# Patient Record
Sex: Male | Born: 1955
Health system: Southern US, Community
[De-identification: ages and names within clinical notes are randomized; demographics above are authoritative.]

## PROBLEM LIST (undated history)

## (undated) DIAGNOSIS — Z720 Tobacco use: Secondary | ICD-10-CM

## (undated) DIAGNOSIS — R51 Headache: Secondary | ICD-10-CM

## (undated) DIAGNOSIS — N2889 Other specified disorders of kidney and ureter: Secondary | ICD-10-CM

## (undated) DIAGNOSIS — I219 Acute myocardial infarction, unspecified: Secondary | ICD-10-CM

## (undated) DIAGNOSIS — E785 Hyperlipidemia, unspecified: Secondary | ICD-10-CM

## (undated) DIAGNOSIS — I255 Ischemic cardiomyopathy: Secondary | ICD-10-CM

## (undated) DIAGNOSIS — R519 Headache, unspecified: Secondary | ICD-10-CM

## (undated) DIAGNOSIS — I2510 Atherosclerotic heart disease of native coronary artery without angina pectoris: Secondary | ICD-10-CM

## (undated) DIAGNOSIS — R935 Abnormal findings on diagnostic imaging of other abdominal regions, including retroperitoneum: Secondary | ICD-10-CM

## (undated) HISTORY — PX: OTHER SURGICAL HISTORY: SHX169

## (undated) HISTORY — DX: Ischemic cardiomyopathy: I25.5

## (undated) HISTORY — DX: Tobacco use: Z72.0

## (undated) HISTORY — PX: BACK SURGERY: SHX140

---

## 2003-09-06 DIAGNOSIS — D229 Melanocytic nevi, unspecified: Secondary | ICD-10-CM

## 2003-09-06 HISTORY — DX: Melanocytic nevi, unspecified: D22.9

## 2004-04-20 ENCOUNTER — Encounter: Admission: RE | Admit: 2004-04-20 | Discharge: 2004-04-20 | Payer: Self-pay | Admitting: Orthopedic Surgery

## 2004-10-12 ENCOUNTER — Ambulatory Visit (HOSPITAL_BASED_OUTPATIENT_CLINIC_OR_DEPARTMENT_OTHER): Admission: RE | Admit: 2004-10-12 | Discharge: 2004-10-12 | Payer: Self-pay | Admitting: Urology

## 2004-10-12 ENCOUNTER — Encounter (INDEPENDENT_AMBULATORY_CARE_PROVIDER_SITE_OTHER): Payer: Self-pay | Admitting: Specialist

## 2007-09-23 ENCOUNTER — Ambulatory Visit (HOSPITAL_COMMUNITY): Admission: RE | Admit: 2007-09-23 | Discharge: 2007-09-24 | Payer: Self-pay | Admitting: Neurosurgery

## 2010-12-18 NOTE — Op Note (Signed)
Brett Clark, Brett Clark NO.:  [id number]   MEDICAL RECORD NO.:  [id number]          PATIENT TYPE:  OIB   LOCATION:  3599                         FACILITY:  [facility]   PHYSICIAN:  Donalee Citrin, M.D.        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   PROCEDURE:  Diskectomy and fusion, C3-4, using a 6-mm allograft wedge  and a 25-mm Venture plate with four 13-mm variable-angled screws.   SURGEON:  Donalee Citrin, MD.   ASSISTANT:  Tia Alert, MD.   ANESTHESIA:  General.   HISTORY OF PRESENT ILLNESS:  The patient is a very pleasant 51-year  gentleman who had progressive numbness and weakness of both hands.  Exam  showed some weakness of his hand intrinsics and weakness of the right  triceps.  MRI showed a large spondylitic spur compressing the spinal  cord with signal change within the spinal cord, hematomyelia or gliosis,  and spinal cord compression at this level.  The patient was recommended  anterior cervical diskectomy and fusion.  The risks and benefits of the  operation were explained to the patient, who understood and agreed to  proceed forward.   The patient was brought into the OR and was induced under general  anesthesia, placed supine with the neck slightly extended in 5 pounds of  halter traction.  The right side of the neck was prepped and draped in  this fashion.  A preoperative x-ray localized the appropriate level.  A  curvilinear incision was made just off the midline to anterior border of  the sternocleidomastoid.  The superficial layer of the platysma was  dissected out and divided longitudinally.  The avascular plane between  the sternomastoid and the strap muscles was developed down to the  prevertebral fascia.  The prevertebral  fascia was dissected with  Kitners.  The intraoperative confirmed localization of the appropriate.  Annulotomy was then made with a 15 blade scalpel to mark the disk space.  The longus colli was reflected laterally.  A self-retaining retractor  was placed.  Then a large anterior osteophyte was bitten off the C3  vertebral body with a 3-mm Kerrison punch, exposing disk space.  The  disk was scraped with a BA curette and pituitary rongeurs and drilled  down with a high-speed drill to the posterior annulus and osteophyte  complexes.  At this point, the operating microscope draped and brought  into the field and under microscopic illumination, the posterior annulus  and posterior longitudinal ligament was identified and removed in  piecemeal fashion.  A large posterior osteophyte coming off the inferior  aspect of the C3 vertebral body and the posterior endplate was  aggressively underbitten, decompressing the central canal.  There was  noted to be marked ligamentous hypertrophy as well as spondylitic  compression of spinal cord and very carefully using a 1-mm Kerrison, the  undersurface of the back  of the C3 vertebral body was aggressively  underbitten as well as the posterior aspect of the C4 vertebral body.  This decompressed the central canal.  Both C4 pedicles were identified  and both C4 nerve neural foramina were decompressed.  At the end of the  diskectomy there was no further stenosis on the thecal sac or either C4  nerve root.  The wound was then copiously irrigated and meticulous  hemostasis was maintained.  The endplates were scraped and prepared to  receive the bone graft.  A 6-mm lordotic Cornerstone allograft wedge was  then inserted 1 mm deep to the anterior vertebral body line.  A 25-  French plate was then placed.  All screws had excellent purchase.  The  locking mechanism was engaged.  The wound was then copiously irrigated  and meticulous hemostasis was maintained.  The  platysma was  reapproximated with interrupted Vicryl and the skin was closed with a  running for subcuticular.  Dermabond, Benzoin and Steri-Strips were then  applied and the patient went to the recovery room in stable condition.  At the end of the case the instrument, needle and sponge counts were  correct.           ______________________________  Donalee Citrin, M.D.     GC/MEDQ  D:  09/23/2007  T:  09/24/2007  Job:  [id number]

## 2010-12-21 NOTE — Op Note (Signed)
Brett Clark, Brett Clark               ACCOUNT NO.:  [id number]   MEDICAL RECORD NO.:  [id number]          PATIENT TYPE:  AMB   LOCATION:  NESC                         FACILITY:  [facility]   PHYSICIAN:  Courtney Paris, M.D.DATE OF BIRTH:  Jan 14, 1956   DATE OF PROCEDURE:  10/12/2004  DATE OF DISCHARGE:                                 OPERATIVE REPORT   PREOPERATIVE DIAGNOSIS:  Bilateral hydroceles, left greater than right.   POSTOPERATIVE DIAGNOSIS:  Bilateral hydroceles, left greater than right.   PROCEDURE PERFORMED:  Bilateral hydrocelectomy.   ATTENDING SURGEON:  Courtney Paris, M.D.   RESIDENT SURGEON:  Thyra Breed, M.D.   ANESTHESIA:  Laryngeal mask airway.   ESTIMATED BLOOD LOSS:  30 cc.   DRAINS:  None.   COMPLICATIONS:  None.   INDICATIONS FOR PROCEDURE:  Brett Clark is a pleasant 55 year old male  evaluated for an enlarged scrotum.  These appeared to be simple hydroceles,  left greater than right.  A subsequent CT scan was ordered, given the  tracking of the bulge into the left inguinal region.  This was consistent  with a simple fluid collection such as a hydrocele.  The patient has been  having some increasing pain in his left testicle and inguinal region.  He  has elected to proceed with a bilateral hydrocelectomy at this time.  The  patient understands all risks, benefits and alternatives of the procedure in  detail and is willing to proceed.   PROCEDURE IN DETAIL:  Following identification by his arm bracelet, the  patient was brought to the operating room and placed in the supine position.  Here, he underwent successful induction of general endotracheal anesthesia  and received preoperative antibiotics.  His genitalia were then shaved,  prepped with Betadine, and draped in the usual sterile fashion.  We created  an approximate 6 cm incision using the scalpel in the midline raphe of the  scrotum.  We then isolated the left hemiscrotum and began to dissect  through  the dartos layers and overlying tunics until the tunica vaginalis was  visualized, containing the hydrocele.  This layer was left intact, and the  overlying layers were dissected out carefully using the hemostat and Bovie  electrocautery.  The left hydrocele was then delivered from the scrotal  incision.  A dry Ray-Tec sponge was used to isolate the entire hydrocele,  even the portion that tracked into the left inguinal region.  The hydrocele  sac was then opened, and approximately 500 cc of clear, straw-colored fluid  returned.  Any excess sac of the hydrocele was then trimmed after the sac  was fully opened.  The edges of the tunica were carefully fulgurated in  their entirety to prevent any postoperative bleeding.  Any remaining points  of bleeding in the dartos was then fulgurated in its entirety using the  Bovie.  A 3-0 chromic suture was then used in a running fashion to close the  inverted tunic on itself in a bottle-neck fashion.  Care was taken to allow  over-tensioning the closure of the tunica over the cord vessels.  We  then  turned our attention to the right-sided hemiscrotum, where an identical  procedure was then performed to perform the right hydrocelectomy.  Again, it  was carefully dissected out, and approximately 60 cc of straw-colored fluid  were gathered.  There was no excess tunica trimmed.  The tunica was opened,  edges fulgurated, and it was closed in an inverted fashion on itself using a  running 3-0 chromic suture.  Any bleeding within the right dartos tissue was  controlled using the Bovie.  At this time, we noted some continued bleeding  from beneath our closure of the left tunic.  Therefore, the proximal portion  of the suture was cut.  The edge of the tunica was seen to be bleeding.  This was bovied and fulgurated in its entirety.  We then used another 3-0  chromic stitch in a running fashion to complete the closure and connect with  the initial suture  line.  The end result on the right and the left were  hemostatic.  Both the testis, appendix, and the testis and epididymis were  removed on either side.  There was no reason to leave a drain.  The  testicles were then returned in their anatomic location without any twisting  or kinking of the cord into their respective hemiscrotums.  A 3-0 chromic  suture was then used in a running fashion to close the dartos, including the  dartos from the right hemiscrotum, the left hemiscrotum, and capturing the  septal dartos in each suture.  Following this, several interrupted 4-0  chromic vertical mattress sutures were used to reapproximate the skin.  The  incision was cleaned and dried.  Collodion was applied to complete the  dressing.  The patient tolerated the procedure well, and there were no  complications.  All sponge, needle, and instrument counts were correct x2.  Dr. Vic Blackbird was present and participated in the entire procedure,  as he was the responsible surgeon.   DISPOSITION:  After awakening from general anesthesia, the patient was  transported to the post anesthesia care unit in stable condition.  From  here, he was discharged to home.  He was given a prescription for Dilaudid 2  mg (#20).  He is to return in five days to see Dr. Aldean Ast.      EG/MEDQ  D:  10/12/2004  T:  10/12/2004  Job:  [id number]

## 2011-04-26 LAB — CBC
Hemoglobin: 15.1
MCHC: 35.2
RBC: 4.8
WBC: 8.9

## 2011-11-04 ENCOUNTER — Emergency Department: Payer: Self-pay | Admitting: Emergency Medicine

## 2013-09-28 ENCOUNTER — Other Ambulatory Visit: Payer: Self-pay | Admitting: Dermatology

## 2013-10-14 ENCOUNTER — Other Ambulatory Visit: Payer: Self-pay | Admitting: Dermatology

## 2014-06-05 DIAGNOSIS — I219 Acute myocardial infarction, unspecified: Secondary | ICD-10-CM

## 2014-06-05 HISTORY — DX: Acute myocardial infarction, unspecified: I21.9

## 2014-06-22 ENCOUNTER — Encounter (HOSPITAL_COMMUNITY): Payer: Self-pay

## 2014-06-22 ENCOUNTER — Emergency Department (HOSPITAL_COMMUNITY): Payer: 59

## 2014-06-22 ENCOUNTER — Inpatient Hospital Stay (HOSPITAL_COMMUNITY)
Admission: EM | Admit: 2014-06-22 | Discharge: 2014-06-24 | DRG: 247 | Disposition: A | Discharge status: Home / Self Care | Payer: 59 | Attending: Interventional Cardiology | Admitting: Interventional Cardiology

## 2014-06-22 DIAGNOSIS — Z79899 Other long term (current) drug therapy: Secondary | ICD-10-CM

## 2014-06-22 DIAGNOSIS — I2510 Atherosclerotic heart disease of native coronary artery without angina pectoris: Secondary | ICD-10-CM | POA: Diagnosis present

## 2014-06-22 DIAGNOSIS — Z7902 Long term (current) use of antithrombotics/antiplatelets: Secondary | ICD-10-CM | POA: Diagnosis not present

## 2014-06-22 DIAGNOSIS — R778 Other specified abnormalities of plasma proteins: Secondary | ICD-10-CM

## 2014-06-22 DIAGNOSIS — Z7982 Long term (current) use of aspirin: Secondary | ICD-10-CM | POA: Diagnosis not present

## 2014-06-22 DIAGNOSIS — E785 Hyperlipidemia, unspecified: Secondary | ICD-10-CM | POA: Diagnosis present

## 2014-06-22 DIAGNOSIS — R1013 Epigastric pain: Secondary | ICD-10-CM

## 2014-06-22 DIAGNOSIS — R10816 Epigastric abdominal tenderness: Secondary | ICD-10-CM | POA: Diagnosis present

## 2014-06-22 DIAGNOSIS — R7989 Other specified abnormal findings of blood chemistry: Secondary | ICD-10-CM

## 2014-06-22 DIAGNOSIS — I214 Non-ST elevation (NSTEMI) myocardial infarction: Secondary | ICD-10-CM | POA: Diagnosis present

## 2014-06-22 DIAGNOSIS — R935 Abnormal findings on diagnostic imaging of other abdominal regions, including retroperitoneum: Secondary | ICD-10-CM

## 2014-06-22 DIAGNOSIS — Z9861 Coronary angioplasty status: Secondary | ICD-10-CM

## 2014-06-22 DIAGNOSIS — R079 Chest pain, unspecified: Secondary | ICD-10-CM

## 2014-06-22 DIAGNOSIS — F17210 Nicotine dependence, cigarettes, uncomplicated: Secondary | ICD-10-CM | POA: Diagnosis present

## 2014-06-22 HISTORY — DX: Hyperlipidemia, unspecified: E78.5

## 2014-06-22 HISTORY — DX: Abnormal findings on diagnostic imaging of other abdominal regions, including retroperitoneum: R93.5

## 2014-06-22 HISTORY — DX: Atherosclerotic heart disease of native coronary artery without angina pectoris: I25.10

## 2014-06-22 LAB — HEPATIC FUNCTION PANEL
ALK PHOS: 97 U/L (ref 39–117)
ALT: 28 U/L (ref 0–53)
AST: 38 U/L — AB (ref 0–37)
Albumin: 4.3 g/dL (ref 3.5–5.2)
Bilirubin, Direct: <0.2 mg/dL (ref 0.0–0.3)
TOTAL PROTEIN: 7.3 g/dL (ref 6.0–8.3)
Total Bilirubin: 0.3 mg/dL (ref 0.3–1.2)

## 2014-06-22 LAB — CBC
HEMATOCRIT: 43 % (ref 39.0–52.0)
HEMOGLOBIN: 14.6 g/dL (ref 13.0–17.0)
MCH: 30.4 pg (ref 26.0–34.0)
MCHC: 34 g/dL (ref 30.0–36.0)
MCV: 89.6 fL (ref 78.0–100.0)
Platelets: 237 10*3/uL (ref 150–400)
RBC: 4.8 MIL/uL (ref 4.22–5.81)
RDW: 13.1 % (ref 11.5–15.5)
WBC: 8.8 10*3/uL (ref 4.0–10.5)

## 2014-06-22 LAB — BASIC METABOLIC PANEL
ANION GAP: 14 (ref 5–15)
BUN: 6 mg/dL (ref 6–23)
CO2: 27 mEq/L (ref 19–32)
CREATININE: 0.88 mg/dL (ref 0.50–1.35)
Calcium: 9.4 mg/dL (ref 8.4–10.5)
Chloride: 101 mEq/L (ref 96–112)
GLUCOSE: 121 mg/dL — AB (ref 70–99)
POTASSIUM: 3.9 meq/L (ref 3.7–5.3)
Sodium: 142 mEq/L (ref 137–147)

## 2014-06-22 LAB — HEPARIN LEVEL (UNFRACTIONATED)

## 2014-06-22 LAB — I-STAT TROPONIN, ED: Troponin i, poc: 3.53 ng/mL (ref 0.00–0.08)

## 2014-06-22 LAB — TROPONIN I: TROPONIN I: 6.06 ng/mL — AB (ref <0.30)

## 2014-06-22 LAB — LIPASE, BLOOD: LIPASE: 32 U/L (ref 11–59)

## 2014-06-22 LAB — TSH: TSH: 2.69 u[IU]/mL (ref 0.350–4.500)

## 2014-06-22 MED ORDER — METOPROLOL TARTRATE 12.5 MG HALF TABLET
12.5000 mg | ORAL_TABLET | Freq: Two times a day (BID) | ORAL | Status: DC
  Administered 2014-06-22 – 2014-06-24 (×4): 12.5 mg via ORAL
  Filled 2014-06-22 (×6): qty 1

## 2014-06-22 MED ORDER — ATORVASTATIN CALCIUM 80 MG PO TABS
80.0000 mg | ORAL_TABLET | Freq: Every day | ORAL | Status: DC
  Administered 2014-06-22 – 2014-06-23 (×2): 80 mg via ORAL
  Filled 2014-06-22 (×3): qty 1

## 2014-06-22 MED ORDER — HEPARIN BOLUS VIA INFUSION
4000.0000 [IU] | Freq: Once | INTRAVENOUS | Status: AC
  Administered 2014-06-22: 4000 [IU] via INTRAVENOUS
  Filled 2014-06-22: qty 4000

## 2014-06-22 MED ORDER — ONDANSETRON HCL 4 MG/2ML IJ SOLN: 4.0000 mg | Freq: Four times a day (QID) | INTRAMUSCULAR | Status: DC | PRN

## 2014-06-22 MED ORDER — ASPIRIN 81 MG PO CHEW
81.0000 mg | CHEWABLE_TABLET | ORAL | Status: AC
Start: 2014-06-23 — End: 2014-06-23
  Administered 2014-06-23: 81 mg via ORAL
  Filled 2014-06-22: qty 1

## 2014-06-22 MED ORDER — SODIUM CHLORIDE 0.9 % IV SOLN: 250.0000 mL | INTRAVENOUS | Status: DC | PRN

## 2014-06-22 MED ORDER — HEPARIN (PORCINE) IN NACL 100-0.45 UNIT/ML-% IJ SOLN
1400.0000 [IU]/h | INTRAMUSCULAR | Status: DC
  Administered 2014-06-22: 1100 [IU]/h via INTRAVENOUS
  Administered 2014-06-23: 1400 [IU]/h via INTRAVENOUS
  Filled 2014-06-22 (×2): qty 250

## 2014-06-22 MED ORDER — SODIUM CHLORIDE 0.9 % IV SOLN
1.0000 mL/kg/h | INTRAVENOUS | Status: DC
  Administered 2014-06-23: 1 mL/kg/h via INTRAVENOUS

## 2014-06-22 MED ORDER — SODIUM CHLORIDE 0.9 % IJ SOLN: 3.0000 mL | Freq: Two times a day (BID) | INTRAMUSCULAR | Status: DC

## 2014-06-22 MED ORDER — FENOFIBRATE 160 MG PO TABS
160.0000 mg | ORAL_TABLET | Freq: Every day | ORAL | Status: DC
  Administered 2014-06-22 – 2014-06-24 (×3): 160 mg via ORAL
  Filled 2014-06-22 (×3): qty 1

## 2014-06-22 MED ORDER — NITROGLYCERIN 0.4 MG SL SUBL: 0.4000 mg | SUBLINGUAL_TABLET | SUBLINGUAL | Status: DC | PRN

## 2014-06-22 MED ORDER — ASPIRIN EC 81 MG PO TBEC
81.0000 mg | DELAYED_RELEASE_TABLET | Freq: Every day | ORAL | Status: DC
  Filled 2014-06-22: qty 1

## 2014-06-22 MED ORDER — SODIUM CHLORIDE 0.9 % IJ SOLN: 3.0000 mL | INTRAMUSCULAR | Status: DC | PRN

## 2014-06-22 MED ORDER — ACETAMINOPHEN 325 MG PO TABS
650.0000 mg | ORAL_TABLET | ORAL | Status: DC | PRN
  Administered 2014-06-23: 650 mg via ORAL
  Filled 2014-06-22: qty 2

## 2014-06-22 NOTE — ED Notes (Addendum)
This RN ordered a heart health tray for the pt.

## 2014-06-22 NOTE — ED Notes (Signed)
Elevated i-stat troponin reported to Dr. Aline Brochure and [facility]

## 2014-06-22 NOTE — ED Provider Notes (Signed)
CSN: [id number]     Arrival date & time 06/22/14  1626 History   First MD Initiated Contact with Patient 06/22/14 1644     Chief Complaint  Patient presents with  . Chest Pain     (Consider location/radiation/quality/duration/timing/severity/associated sxs/prior Treatment) Patient is a 58 y.o. male presenting with abdominal pain. The history is provided by the patient.  Abdominal Pain Pain location:  RUQ and epigastric Pain quality: burning   Pain radiates to:  Does not radiate Pain severity:  Moderate Onset quality:  Sudden Duration:  2 hours Timing:  Constant Progression:  Resolved Chronicity:  New Context comment:  At rest Relieved by: dilaudid. Worsened by:  Nothing tried Ineffective treatments:  None tried Associated symptoms: no diarrhea, no dysuria and no hematuria     History reviewed. No pertinent past medical history. Past Surgical History  Procedure Laterality Date  . Back surgery     History reviewed. No pertinent family history. History  Substance Use Topics  . Smoking status: Current Some Day Smoker  . Smokeless tobacco: Not on file  . Alcohol Use: Yes    Review of Systems  HENT: Negative for drooling and rhinorrhea.   Eyes: Negative for pain.  Cardiovascular: Negative for leg swelling.  Gastrointestinal: Positive for abdominal pain. Negative for diarrhea.  Genitourinary: Negative for dysuria and hematuria.  Musculoskeletal: Negative for gait problem and neck pain.  Skin: Negative for color change.  Hematological: Negative for adenopathy.  Psychiatric/Behavioral: Negative for behavioral problems.  All other systems reviewed and are negative.     Allergies  Review of patient's allergies indicates not on file.  Home Medications   Prior to Admission medications   Not on File   BP 141/83 mmHg  Pulse 78  Temp(Src) 98.3 F (36.8 C) (Oral)  Resp 18  SpO2 100% Physical Exam  Constitutional: He is oriented to person, place, and time. He  appears well-developed and well-nourished.  HENT:  Head: Normocephalic and atraumatic.  Right Ear: External ear normal.  Left Ear: External ear normal.  Nose: Nose normal.  Mouth/Throat: Oropharynx is clear and moist. No oropharyngeal exudate.  Eyes: Conjunctivae and EOM are normal. Pupils are equal, round, and reactive to light.  Neck: Normal range of motion. Neck supple.  Cardiovascular: Normal rate, regular rhythm, normal heart sounds and intact distal pulses.  Exam reveals no gallop and no friction rub.   No murmur heard. Pulmonary/Chest: Effort normal and breath sounds normal. No respiratory distress. He has no wheezes.  Abdominal: Soft. Bowel sounds are normal. He exhibits no distension. There is tenderness (mild epig ttp). There is no rebound and no guarding.  Musculoskeletal: Normal range of motion. He exhibits no edema or tenderness.  Neurological: He is alert and oriented to person, place, and time.  Skin: Skin is warm and dry.  Psychiatric: He has a normal mood and affect. His behavior is normal.  Nursing note and vitals reviewed.   ED Course  Procedures (including critical care time) Labs Review Labs Reviewed  BASIC METABOLIC PANEL - Abnormal; Notable for the following:    Glucose, Bld 121 (*)    All other components within normal limits  HEPATIC FUNCTION PANEL - Abnormal; Notable for the following:    AST 38 (*)    All other components within normal limits  TROPONIN I - Abnormal; Notable for the following:    Troponin I 6.06 (*)    All other components within normal limits  I-STAT TROPOININ, ED - Abnormal; Notable for  the following:    Troponin i, poc 3.53 (*)    All other components within normal limits  CBC  LIPASE, BLOOD  HEPARIN LEVEL (UNFRACTIONATED)  HEPARIN LEVEL (UNFRACTIONATED)  CBC  HEMOGLOBIN A1C  TSH  BASIC METABOLIC PANEL  LIPID PANEL    Imaging Review Dg Chest 2 View  06/22/2014   CLINICAL DATA:  Chest pain began last night at home, sharp  substernal chest pain, smoking history  EXAM: CHEST  2 VIEW  COMPARISON:  None  FINDINGS: Normal heart size, mediastinal contours, and pulmonary vascularity.  Biconvex cervicothoracic scoliosis.  Lungs clear.  No pleural effusion or pneumothorax.  Bones demineralized.  IMPRESSION: Scoliosis.  No acute abnormalities.   Electronically Signed   By: Lavonia Dana M.D.   On: 06/22/2014 17:31     EKG Interpretation   Date/Time:  Wednesday June 22 2014 16:29:39 EST Ventricular Rate:  74 PR Interval:  164 QRS Duration: 110 QT Interval:  400 QTC Calculation: 444 R Axis:   107 Text Interpretation:  Normal sinus rhythm Rightward axis Incomplete right  bundle branch block T wave abnormality, consider lateral ischemia changes  noted Confirmed by Talaya Lamprecht  MD, Maxson Oddo ([id number]) on 06/22/2014 4:56:24 PM     CRITICAL CARE Performed by: Pamella Pert, S Total critical care time: 30 min Critical care time was exclusive of separately billable procedures and treating other patients. Critical care was necessary to treat or prevent imminent or life-threatening deterioration. Critical care was time spent personally by me on the following activities: development of treatment plan with patient and/or surrogate as well as nursing, discussions with consultants, evaluation of patient's response to treatment, examination of patient, obtaining history from patient or surrogate, ordering and performing treatments and interventions, ordering and review of laboratory studies, ordering and review of radiographic studies, pulse oximetry and re-evaluation of patient's condition.  MDM   Final diagnoses:  Elevated troponin  Epigastric pain    4:53 PM 58 y.o. male w hx of tobacco abuse (not currently a smoker) who presents with request for evaluation. He states that he began having some epigastric burning and discomfort yesterday evening. He went to a hospital on [place] where he was staying. It appears from their  paperwork that he got a CT without contrast of his abdomen which was noncontributory. He did have an elevated troponin to 4.1 and was admitted to the hospital. They wanted to perform a heart catheterization today but he preferred to be worked up here in [place] where he lived. He signed out AMA and drove to [place] currently presents now. He states that his symptoms lasted for 2 hours last night and he has been asymptomatic since then. He is currently a systematic on exam.  Pt took a full ASA pta.   Cardiology consulted d/t elevated troponin.   Pamella Pert, MD 06/22/14 2226

## 2014-06-22 NOTE — ED Notes (Signed)
Attempted report 

## 2014-06-22 NOTE — H&P (Signed)
Brett Clark is an 58 y.o. male.   Chief Complaint: Chest Pain HPI:  Patient's a 58 year old male with remote tobacco abuse and no prior cardiac history.  No history of GI bleeding He takes no meds. Patient was in [facility] yesterday when he developed severe epigastric pain.  Was 10 out of 10 in intensity and he described as burning.  Associated with nausea, vomiting and SOB.   Patient spent the night at [facility]. His troponin was elevated at 4.83. It was recommended he undergo left heart catheterization. He subsequent signed out AMA to come to [facility] for his procedure. The patient currently denies fever,  orthopnea, dizziness, PND, cough, congestion, abdominal pain, hematochezia, melena, lower extremity edema, claudication.   Medications:  None  History reviewed. No pertinent past medical history.  Past Surgical History  Procedure Laterality Date  . Back surgery      History reviewed. No pertinent family history. Social History:  reports that he has been smoking.  He does not have any smokeless tobacco history on file. He reports that he drinks alcohol. His drug history is not on file.  Allergies:  Allergies  Allergen Reactions  . Codeine     headaches     (Not in a hospital admission)  Results for orders placed or performed during the hospital encounter of 06/22/14 (from the past 48 hour(s))  CBC     Status: None   Collection Time: 06/22/14  4:31 PM  Result Value Ref Range   WBC 8.8 4.0 - 10.5 K/uL   RBC 4.80 4.22 - 5.81 MIL/uL   Hemoglobin 14.6 13.0 - 17.0 g/dL   HCT 43.0 39.0 - 52.0 %   MCV 89.6 78.0 - 100.0 fL   MCH 30.4 26.0 - 34.0 pg   MCHC 34.0 30.0 - 36.0 g/dL   RDW 13.1 11.5 - 15.5 %   Platelets 237 150 - 400 K/uL  I-stat troponin, ED (not at [facility])     Status: Abnormal   Collection Time: 06/22/14  4:49 PM  Result Value Ref Range   Troponin i, poc 3.53 (HH) 0.00 - 0.08 ng/mL   Comment NOTIFIED PHYSICIAN    Comment 3           Comment: Due to the release kinetics of cTnI, a negative result within the first hours of the onset of symptoms does not rule out myocardial infarction with certainty. If myocardial infarction is still suspected, repeat the test at appropriate intervals.    No results found.  Review of Systems  Constitutional: Negative for fever and diaphoresis.  HENT: Negative for congestion and sore throat.   Respiratory: Positive for shortness of breath. Negative for cough.   Cardiovascular: Positive for chest pain ("burning"). Negative for palpitations, orthopnea, leg swelling and PND.  Gastrointestinal: Positive for nausea, vomiting and abdominal pain (Epigastric). Negative for blood in stool and melena.  Genitourinary: Negative for hematuria.  Musculoskeletal: Negative for myalgias.  Neurological: Negative for dizziness.  All other systems reviewed and are negative.   Blood pressure 156/93, pulse 87, temperature 98.3 F (36.8 C), temperature source Oral, resp. rate 21, height 6' (1.829 m), weight 185 lb (83.915 kg), SpO2 100 %. Physical Exam  Nursing note and vitals reviewed. Constitutional: He appears well-developed and well-nourished. No distress.  HENT:  Head: Normocephalic and atraumatic.  Mouth/Throat: No oropharyngeal exudate.  Eyes: EOM are normal. Pupils are equal, round, and reactive to light. No scleral icterus.  Neck: Normal range of  motion. Neck supple. No JVD present.  Cardiovascular: Normal rate, regular rhythm, S1 normal and S2 normal.   No murmur heard. Pulses:      Radial pulses are 2+ on the right side, and 2+ on the left side.       Dorsalis pedis pulses are 2+ on the right side, and 2+ on the left side.  No carotid bruit     Assessment/Plan Principal Problem:   Dyslipidemia Active Problems:   Non-STEMI (non-ST elevated myocardial infarction)  The patient will be admitted to telemetry. He has been started on IV heparin. We'll also add aspirin, beta blocker  statin. He does have triglycerides from labs at [facility] which are 323. We'll also start fenofibrate.  Patient be scheduled for left heart catheterization tomorrow.  Clear liquid breakfast is ok before 0800hrs.  Tarri Fuller, [place] 06/22/2014, 5:23 PM

## 2014-06-22 NOTE — ED Notes (Signed)
Pt from home with chest pain that started last night.  Pt was visiting brother in [place], [place] when he developed sharp substernal chest pain.  Pt was admitted to [facility] overnight for chest pain.  Pt sts that they wanted to do a cath on him today but left due to wanting procedure to be done here in [place] where he lives.  He is currently not having any chest pain.  Brings paperwork from hospital in hand.

## 2014-06-22 NOTE — ED Notes (Signed)
Cardiology PA at bedside. PT returned from [place]

## 2014-06-22 NOTE — Progress Notes (Signed)
ANTICOAGULATION CONSULT NOTE - Initial Consult  Pharmacy Consult for heparin Indication: chest pain/ACS  Allergies  Allergen Reactions  . Codeine     headaches    Patient Measurements: Height: 6' (182.9 cm) Weight: 185 lb (83.915 kg) IBW/kg (Calculated) : 77.6  Vital Signs: Temp: 98.3 F (36.8 C) (11/18 1634) Temp Source: Oral (11/18 1634) BP: 141/83 mmHg (11/18 1634) Pulse Rate: 78 (11/18 1634)  Labs:  Recent Labs  06/22/14 1631  HGB 14.6  HCT 43.0  PLT 237    CrCl cannot be calculated (Patient has no serum creatinine result on file.).   Medical History: History reviewed. No pertinent past medical history.   Assessment: 58 yo m who presented to the ED on 11/18 after having chest pain.  Pharmacy is consulted to begin heparin.  Patient is not on [facility] PTA.  Hgb 14.6, plts 237, no bleeding noted.  Goal of Therapy:  Heparin level 0.3-0.7 units/ml Monitor platelets by anticoagulation protocol: Yes   Plan:  Heparin bolus 4,000 units x 1 Heparin infusion 1100 units/hr 6-hr HL @ 2330 Monitor hgb/plts, s/s of bleeding, clinical course  Leon Montoya L. Nicole Kindred, PharmD Clinical Pharmacy Resident Pager: [phone] 06/22/2014 5:16 PM

## 2014-06-23 ENCOUNTER — Other Ambulatory Visit: Payer: Self-pay

## 2014-06-23 ENCOUNTER — Encounter (HOSPITAL_COMMUNITY)
Admission: EM | Disposition: A | Discharge status: Home / Self Care | Payer: 59 | Source: Home / Self Care | Attending: Interventional Cardiology

## 2014-06-23 DIAGNOSIS — I2510 Atherosclerotic heart disease of native coronary artery without angina pectoris: Secondary | ICD-10-CM

## 2014-06-23 DIAGNOSIS — I214 Non-ST elevation (NSTEMI) myocardial infarction: Principal | ICD-10-CM

## 2014-06-23 DIAGNOSIS — I059 Rheumatic mitral valve disease, unspecified: Secondary | ICD-10-CM

## 2014-06-23 HISTORY — PX: CARDIAC CATHETERIZATION: SHX172

## 2014-06-23 HISTORY — PX: LEFT HEART CATHETERIZATION WITH CORONARY ANGIOGRAM: SHX5451

## 2014-06-23 LAB — HEPARIN LEVEL (UNFRACTIONATED): Heparin Unfractionated: 0.38 IU/mL (ref 0.30–0.70)

## 2014-06-23 LAB — LIPID PANEL
Cholesterol: 192 mg/dL (ref 0–200)
HDL: 21 mg/dL — AB (ref >39)
LDL CALC: 94 mg/dL (ref 0–99)
Total CHOL/HDL Ratio: 9.1 RATIO
Triglycerides: 386 mg/dL — ABNORMAL HIGH (ref <150)
VLDL: 77 mg/dL — ABNORMAL HIGH (ref 0–40)

## 2014-06-23 LAB — CBC
HCT: 40.2 % (ref 39.0–52.0)
Hemoglobin: 13.4 g/dL (ref 13.0–17.0)
MCH: 29.8 pg (ref 26.0–34.0)
MCHC: 33.3 g/dL (ref 30.0–36.0)
MCV: 89.3 fL (ref 78.0–100.0)
Platelets: 216 10*3/uL (ref 150–400)
RBC: 4.5 MIL/uL (ref 4.22–5.81)
RDW: 13.2 % (ref 11.5–15.5)
WBC: 8 10*3/uL (ref 4.0–10.5)

## 2014-06-23 LAB — BASIC METABOLIC PANEL
ANION GAP: 10 (ref 5–15)
BUN: 8 mg/dL (ref 6–23)
CHLORIDE: 103 meq/L (ref 96–112)
CO2: 27 meq/L (ref 19–32)
CREATININE: 1 mg/dL (ref 0.50–1.35)
Calcium: 9.2 mg/dL (ref 8.4–10.5)
GFR calc Af Amer: >90 mL/min (ref >90)
GFR calc non Af Amer: 80 mL/min — ABNORMAL LOW (ref >90)
Glucose, Bld: 105 mg/dL — ABNORMAL HIGH (ref 70–99)
Potassium: 4.6 mEq/L (ref 3.7–5.3)
Sodium: 140 mEq/L (ref 137–147)

## 2014-06-23 LAB — HEMOGLOBIN A1C
Hgb A1c MFr Bld: 5.8 % — ABNORMAL HIGH (ref <5.7)
Mean Plasma Glucose: 120 mg/dL — ABNORMAL HIGH (ref <117)

## 2014-06-23 LAB — POCT ACTIVATED CLOTTING TIME: ACTIVATED CLOTTING TIME: 788 s

## 2014-06-23 LAB — PROTIME-INR
INR: 1.04 (ref 0.00–1.49)
Prothrombin Time: 13.7 seconds (ref 11.6–15.2)

## 2014-06-23 SURGERY — LEFT HEART CATHETERIZATION WITH CORONARY ANGIOGRAM
Anesthesia: LOCAL

## 2014-06-23 MED ORDER — MIDAZOLAM HCL 2 MG/2ML IJ SOLN
INTRAMUSCULAR | Status: AC
  Filled 2014-06-23: qty 2

## 2014-06-23 MED ORDER — TRAMADOL HCL 50 MG PO TABS
50.0000 mg | ORAL_TABLET | Freq: Four times a day (QID) | ORAL | Status: DC | PRN
  Administered 2014-06-23: 18:00:00 50 mg via ORAL
  Filled 2014-06-23: qty 1

## 2014-06-23 MED ORDER — NITROGLYCERIN 1 MG/10 ML FOR IR/CATH LAB
INTRA_ARTERIAL | Status: AC
  Filled 2014-06-23: qty 10

## 2014-06-23 MED ORDER — HEPARIN (PORCINE) IN NACL 2-0.9 UNIT/ML-% IJ SOLN
INTRAMUSCULAR | Status: AC
  Filled 2014-06-23: qty 1500

## 2014-06-23 MED ORDER — PRASUGREL HCL 10 MG PO TABS
ORAL_TABLET | ORAL | Status: AC
  Filled 2014-06-23: qty 6

## 2014-06-23 MED ORDER — SODIUM CHLORIDE 0.9 % IV SOLN
1.0000 mL/kg/h | INTRAVENOUS | Status: AC
  Administered 2014-06-23: 15:00:00 1 mL/kg/h via INTRAVENOUS

## 2014-06-23 MED ORDER — ASPIRIN 81 MG PO CHEW
81.0000 mg | CHEWABLE_TABLET | Freq: Every day | ORAL | Status: DC
  Administered 2014-06-24: 10:00:00 81 mg via ORAL
  Filled 2014-06-23: qty 1

## 2014-06-23 MED ORDER — HEPARIN BOLUS VIA INFUSION
3000.0000 [IU] | Freq: Once | INTRAVENOUS | Status: AC
  Administered 2014-06-23: 3000 [IU] via INTRAVENOUS
  Filled 2014-06-23: qty 3000

## 2014-06-23 MED ORDER — HEPARIN SODIUM (PORCINE) 1000 UNIT/ML IJ SOLN
INTRAMUSCULAR | Status: AC
  Filled 2014-06-23: qty 1

## 2014-06-23 MED ORDER — LIDOCAINE HCL (PF) 1 % IJ SOLN
INTRAMUSCULAR | Status: AC
  Filled 2014-06-23: qty 30

## 2014-06-23 MED ORDER — PANTOPRAZOLE SODIUM 40 MG PO TBEC
40.0000 mg | DELAYED_RELEASE_TABLET | Freq: Every day | ORAL | Status: DC
  Administered 2014-06-23 – 2014-06-24 (×2): 40 mg via ORAL
  Filled 2014-06-23 (×2): qty 1

## 2014-06-23 MED ORDER — PRASUGREL HCL 10 MG PO TABS
10.0000 mg | ORAL_TABLET | Freq: Every day | ORAL | Status: DC
  Administered 2014-06-24: 10:00:00 10 mg via ORAL
  Filled 2014-06-23 (×2): qty 1

## 2014-06-23 MED ORDER — ALUM & MAG HYDROXIDE-SIMETH 200-200-20 MG/5ML PO SUSP
30.0000 mL | ORAL | Status: DC | PRN
  Administered 2014-06-23: 30 mL via ORAL
  Filled 2014-06-23: qty 30

## 2014-06-23 MED ORDER — BIVALIRUDIN 250 MG IV SOLR
INTRAVENOUS | Status: AC
  Filled 2014-06-23: qty 250

## 2014-06-23 MED ORDER — FENTANYL CITRATE 0.05 MG/ML IJ SOLN
INTRAMUSCULAR | Status: AC
  Filled 2014-06-23: qty 2

## 2014-06-23 MED ORDER — VERAPAMIL HCL 2.5 MG/ML IV SOLN
INTRAVENOUS | Status: AC
  Filled 2014-06-23: qty 2

## 2014-06-23 NOTE — Progress Notes (Signed)
UR Completed Dearius Hoffmann Graves-Bigelow, RN,BSN [phone]  

## 2014-06-23 NOTE — Progress Notes (Signed)
TR BAND REMOVAL  LOCATION:    right radial  DEFLATED PER PROTOCOL:    Yes.    TIME BAND OFF / DRESSING APPLIED:    1715   SITE UPON ARRIVAL:    Level 0  SITE AFTER BAND REMOVAL:    Level 0  REVERSE ALLEN'S TEST:     positive  CIRCULATION SENSATION AND MOVEMENT:    Within Normal Limits   Yes.    COMMENTS:   Tolerated procedure well 

## 2014-06-23 NOTE — Progress Notes (Signed)
  Echocardiogram 2D Echocardiogram has been performed.  Diamond Nickel 06/23/2014, 9:47 AM

## 2014-06-23 NOTE — Progress Notes (Signed)
ANTICOAGULATION CONSULT NOTE - Initial Consult  Pharmacy Consult for heparin Indication: chest pain/ACS  Allergies  Allergen Reactions  . Codeine     headaches    Patient Measurements: Height: 6' (182.9 cm) Weight: 179 lb 3.2 oz (81.285 kg) IBW/kg (Calculated) : 77.6  Vital Signs: Temp: 97.7 F (36.5 C) (11/19 0617) Temp Source: Oral (11/19 0617) BP: 123/64 mmHg (11/19 1013) Pulse Rate: 67 (11/19 1013)  Labs:  Recent Labs  06/22/14 1631 06/22/14 2303 06/23/14 0120 06/23/14 0755  HGB 14.6  --  13.4  --   HCT 43.0  --  40.2  --   PLT 237  --  216  --   LABPROT  --   --  13.7  --   INR  --   --  1.04  --   HEPARINUNFRC  --  <0.10*  --  0.38  CREATININE 0.88  --  1.00  --   TROPONINI 6.06*  --   --   --     Estimated Creatinine Clearance: 87.3 mL/min (by C-G formula based on Cr of 1).   Medical History: History reviewed. No pertinent past medical history.   Assessment: 58 yo m on IV heparin for chest pain. Heparin level 0.38, therapeutic on 1400 units/hr. hgb 13.4, plt 216. Plan for cath this afternoon.  Goal of Therapy:  Heparin level 0.3-0.7 units/ml Monitor platelets by anticoagulation protocol: Yes   Plan:  - Continue heparin infusion 1400 units/hr - F/u after cath  Maryanna Shape, PharmD, BCPS  Clinical Pharmacist  Pager: [phone]

## 2014-06-23 NOTE — Interval H&P Note (Signed)
History and Physical Interval Note:  06/23/2014 12:03 PM  Brett Clark  has presented today for surgery, with the diagnosis of NSTEMI  The various methods of treatment have been discussed with the patient and family. After consideration of risks, benefits and other options for treatment, the patient has consented to  Procedure(s): LEFT HEART CATHETERIZATION WITH CORONARY ANGIOGRAM (N/A) as a surgical intervention .  The patient's history has been reviewed, patient examined, no change in status, stable for surgery.  I have reviewed the patient's chart and labs.  Questions were answered to the patient's satisfaction.   Cath Lab Visit (complete for each Cath Lab visit)  Clinical Evaluation Leading to the Procedure:   ACS: Yes.    Non-ACS:    Anginal Classification: CCS IV  Anti-ischemic medical therapy: No Therapy  Non-Invasive Test Results: No non-invasive testing performed  Prior CABG: No previous CABG        Brett Clark [facility] 06/23/2014 12:03 PM

## 2014-06-23 NOTE — Progress Notes (Signed)
   SUBJECTIVE:  No CP. Awaiting cath.  OBJECTIVE:   Vitals:   Filed Vitals:   06/22/14 1900 06/22/14 1959 06/23/14 0617 06/23/14 1013  BP: 132/67 152/78 142/80 123/64  Pulse: 79 73 68 67  Temp:  98.2 F (36.8 C) 97.7 F (36.5 C)   TempSrc:  Oral Oral   Resp: 19 20    Height:  6' (1.829 m)    Weight:  179 lb 12.8 oz (81.557 kg) 179 lb 3.2 oz (81.285 kg)   SpO2: 99% 99% 100%    I&O's:   Intake/Output Summary (Last 24 hours) at 06/23/14 1200 Last data filed at 06/23/14 0600  Gross per 24 hour  Intake 455.79 ml  Output    150 ml  Net 305.79 ml   TELEMETRY: Reviewed telemetry pt in NSR:     PHYSICAL EXAM General: Well developed, well nourished, in no acute distress Head:   Normal cephalic and atramatic  Lungs: No wheezing Heart:   HRRR No JVD.   Abdomen: abdomen soft and non-tender Msk:  Back normal,  Normal strength and tone for age. Extremities: 2+ right radial pulse Neuro: Alert and oriented. Psych:  Normal affect, responds appropriately Skin: No rash   LABS: Basic Metabolic Panel:  Recent Labs  06/22/14 1631 06/23/14 0120  NA 142 140  K 3.9 4.6  CL 101 103  CO2 27 27  GLUCOSE 121* 105*  BUN 6 8  CREATININE 0.88 1.00  CALCIUM 9.4 9.2   Liver Function Tests:  Recent Labs  06/22/14 1631  AST 38*  ALT 28  ALKPHOS 97  BILITOT 0.3  PROT 7.3  ALBUMIN 4.3    Recent Labs  06/22/14 1631  LIPASE 32   CBC:  Recent Labs  06/22/14 1631 06/23/14 0120  WBC 8.8 8.0  HGB 14.6 13.4  HCT 43.0 40.2  MCV 89.6 89.3  PLT 237 216   Cardiac Enzymes:  Recent Labs  06/22/14 1631  TROPONINI 6.06*   BNP: Invalid input(s): POCBNP D-Dimer: No results for input(s): DDIMER in the last 72 hours. Hemoglobin A1C:  Recent Labs  06/22/14 2303  HGBA1C 5.8*   Fasting Lipid Panel:  Recent Labs  06/23/14 0120  CHOL 192  HDL 21*  LDLCALC 94  TRIG 386*  CHOLHDL 9.1   Thyroid Function Tests:  Recent Labs  06/22/14 2303  TSH 2.690   Anemia  Panel: No results for input(s): VITAMINB12, FOLATE, FERRITIN, TIBC, IRON, RETICCTPCT in the last 72 hours. Coag Panel:   Lab Results  Component Value Date   INR 1.04 06/23/2014    RADIOLOGY: Dg Chest 2 View  06/22/2014   CLINICAL DATA:  Chest pain began last night at home, sharp substernal chest pain, smoking history  EXAM: CHEST  2 VIEW  COMPARISON:  None  FINDINGS: Normal heart size, mediastinal contours, and pulmonary vascularity.  Biconvex cervicothoracic scoliosis.  Lungs clear.  No pleural effusion or pneumothorax.  Bones demineralized.  IMPRESSION: Scoliosis.  No acute abnormalities.   Electronically Signed   By: Lavonia Dana M.D.   On: 06/22/2014 17:31      ASSESSMENT: Kathyrn Lass:  NSTEMI: await cardiac cath.  All questions about the procedure were answered.   Jettie Booze, MD  06/23/2014  12:00 PM

## 2014-06-23 NOTE — Progress Notes (Signed)
ANTICOAGULATION CONSULT NOTE - Initial Consult  Pharmacy Consult for heparin Indication: chest pain/ACS  Allergies  Allergen Reactions  . Codeine     headaches    Patient Measurements: Height: 6' (182.9 cm) Weight: 179 lb 12.8 oz (81.557 kg) IBW/kg (Calculated) : 77.6  Vital Signs: Temp: 98.2 F (36.8 C) (11/18 1959) Temp Source: Oral (11/18 1959) BP: 152/78 mmHg (11/18 1959) Pulse Rate: 73 (11/18 1959)  Labs:  Recent Labs  06/22/14 1631 06/22/14 2303  HGB 14.6  --   HCT 43.0  --   PLT 237  --   HEPARINUNFRC  --  <0.10*  CREATININE 0.88  --   TROPONINI 6.06*  --     Estimated Creatinine Clearance: 99.2 mL/min (by C-G formula based on Cr of 0.88).   Medical History: History reviewed. No pertinent past medical history.   Assessment: 58 yo m who presented to the ED on 11/18 after having chest pain. HL is undetectable after 4000 unit bolus and rate of 1100 units/hr. No infusion related problems or interruptions.  Goal of Therapy:  Heparin level 0.3-0.7 units/ml Monitor platelets by anticoagulation protocol: Yes   Plan:  Heparin bolus 3000 units then increase to 1400 units/hr recheck HL in 6 hours.   Curlene Dolphin

## 2014-06-23 NOTE — H&P (View-Only) (Signed)
   SUBJECTIVE:  No CP. Awaiting cath.  OBJECTIVE:   Vitals:   Filed Vitals:   06/22/14 1900 06/22/14 1959 06/23/14 0617 06/23/14 1013  BP: 132/67 152/78 142/80 123/64  Pulse: 79 73 68 67  Temp:  98.2 F (36.8 C) 97.7 F (36.5 C)   TempSrc:  Oral Oral   Resp: 19 20    Height:  6' (1.829 m)    Weight:  179 lb 12.8 oz (81.557 kg) 179 lb 3.2 oz (81.285 kg)   SpO2: 99% 99% 100%    I&O's:   Intake/Output Summary (Last 24 hours) at 06/23/14 1200 Last data filed at 06/23/14 0600  Gross per 24 hour  Intake 455.79 ml  Output    150 ml  Net 305.79 ml   TELEMETRY: Reviewed telemetry pt in NSR:     PHYSICAL EXAM General: Well developed, well nourished, in no acute distress Head:   Normal cephalic and atramatic  Lungs: No wheezing Heart:   HRRR No JVD.   Abdomen: abdomen soft and non-tender Msk:  Back normal,  Normal strength and tone for age. Extremities: 2+ right radial pulse Neuro: Alert and oriented. Psych:  Normal affect, responds appropriately Skin: No rash   LABS: Basic Metabolic Panel:  Recent Labs  06/22/14 1631 06/23/14 0120  NA 142 140  K 3.9 4.6  CL 101 103  CO2 27 27  GLUCOSE 121* 105*  BUN 6 8  CREATININE 0.88 1.00  CALCIUM 9.4 9.2   Liver Function Tests:  Recent Labs  06/22/14 1631  AST 38*  ALT 28  ALKPHOS 97  BILITOT 0.3  PROT 7.3  ALBUMIN 4.3    Recent Labs  06/22/14 1631  LIPASE 32   CBC:  Recent Labs  06/22/14 1631 06/23/14 0120  WBC 8.8 8.0  HGB 14.6 13.4  HCT 43.0 40.2  MCV 89.6 89.3  PLT 237 216   Cardiac Enzymes:  Recent Labs  06/22/14 1631  TROPONINI 6.06*   BNP: Invalid input(s): POCBNP D-Dimer: No results for input(s): DDIMER in the last 72 hours. Hemoglobin A1C:  Recent Labs  06/22/14 2303  HGBA1C 5.8*   Fasting Lipid Panel:  Recent Labs  06/23/14 0120  CHOL 192  HDL 21*  LDLCALC 94  TRIG 386*  CHOLHDL 9.1   Thyroid Function Tests:  Recent Labs  06/22/14 2303  TSH 2.690   Anemia  Panel: No results for input(s): VITAMINB12, FOLATE, FERRITIN, TIBC, IRON, RETICCTPCT in the last 72 hours. Coag Panel:   Lab Results  Component Value Date   INR 1.04 06/23/2014    RADIOLOGY: Dg Chest 2 View  06/22/2014   CLINICAL DATA:  Chest pain began last night at home, sharp substernal chest pain, smoking history  EXAM: CHEST  2 VIEW  COMPARISON:  None  FINDINGS: Normal heart size, mediastinal contours, and pulmonary vascularity.  Biconvex cervicothoracic scoliosis.  Lungs clear.  No pleural effusion or pneumothorax.  Bones demineralized.  IMPRESSION: Scoliosis.  No acute abnormalities.   Electronically Signed   By: Lavonia Dana M.D.   On: 06/22/2014 17:31      ASSESSMENT: Brett Clark:  NSTEMI: await cardiac cath.  All questions about the procedure were answered.   Jettie Booze, MD  06/23/2014  12:00 PM

## 2014-06-23 NOTE — CV Procedure (Signed)
    Cardiac Catheterization Procedure Note  Name: Brett Clark MRN: [id number] DOB: July 30, 1956  Procedure: Left Heart Cath, Selective Coronary Angiography, LV angiography, PTCA and stenting of the LAD  Indication: 58 yo WM with NSTEMI.   Procedural Details:  The right wrist was prepped, draped, and anesthetized with 1% lidocaine. Using the modified Seldinger technique, a 6 French slender sheath was introduced into the right radial artery. 3 mg of verapamil was administered through the sheath, weight-based unfractionated heparin was administered intravenously. Standard Judkins catheters were used for selective coronary angiography and left ventriculography. Catheter exchanges were performed over an exchange length guidewire.  PROCEDURAL FINDINGS Hemodynamics: AO 139/73 mean 99 mm Hg LV 138/16 mm Hg   Coronary angiography: Coronary dominance: right  Left mainstem: Normal  Left anterior descending (LAD): The mid LAD has an eccentric 80% lesion with some hypodensity. The first and second diagonal branches are normal.  Left circumflex (LCx): Normal  Right coronary artery (RCA): Normal.  Left ventriculography: Left ventricular systolic function is abnormal, there is akinesis of the anteroapical and inferoapical wall segments. LVEF is estimated at 40-45%, there is no significant mitral regurgitation   PCI Note:  Following the diagnostic procedure, the decision was made to proceed with PCI.  Weight-based bivalirudin was given for anticoagulation. Effient 60 mg was given orally. Once a therapeutic ACT was achieved, a 6 [place] XBLAD guide catheter was inserted.  A prowater coronary guidewire was used to cross the lesion.  The lesion was predilated with a 2.25 mm balloon.  The lesion was then stented with a 2.5 x 18 mm Xience Alpine stent.  The stent was postdilated with a 2.75 mm noncompliant balloon.  Following PCI, there was 0% residual stenosis and TIMI-3 flow. Final angiography confirmed  an excellent result. The patient tolerated the procedure well. There were no immediate procedural complications. A TR band was used for radial hemostasis. The patient was transferred to the post catheterization recovery area for further monitoring.  PCI Data: Vessel - LAD/Segment - mid Percent Stenosis (pre)  80% TIMI-flow 3 Stent 2.5 x 18 mm Xience Alpine Percent Stenosis (post) 0% TIMI-flow (post) 3  Final Conclusions:   1. Single vessel obstructive CAD 2. Mild to moderate LV dysfunction 3. Successful stenting of the mid LAD with DES   Recommendations:  DAPT for one year. Risk factor modification. Anticipate DC in am.  Zahmir Lalla [place], [place] 06/23/2014, 1:03 PM

## 2014-06-23 NOTE — Care Management Note (Signed)
Page 1 of 2   06/24/2014     8:57:32 AM CARE MANAGEMENT NOTE 06/24/2014  Patient:  Brett Clark, Brett Clark   Account Number:  [id number]  Date Initiated:  06/23/2014  Documentation initiated by:  GRAVES-BIGELOW,BRENDA  Subjective/Objective Assessment:   Pt admitted for cp- Nstemi. Initiated on Iv heparin gtt. Plan for cardiac cath today.     Action/Plan:   CM will continue to monitor for disposition needs.   Anticipated DC Date:  06/25/2014   Anticipated DC Plan:  [place]  CM consult  Medication Assistance      Choice offered to / List presented to:             Status of service:  Completed, signed off Medicare Important Message given?   (If response is "NO", the following Medicare IM given date fields will be blank) Date Medicare IM given:   Medicare IM given by:   Date Additional Medicare IM given:   Additional Medicare IM given by:    Discharge Disposition:  HOME/SELF CARE  Per UR Regulation:  Reviewed for med. necessity/level of care/duration of stay  If discussed at [place] of Stay Meetings, dates discussed:    Comments:  Jerrine Urschel RN, BSN, MSHL, CCM  Nurse - Case Manager,  (Unit [phone]  06/24/2014 Benefits Check update: S/W [facility] [place] # [phone] EFFIENT 10 MG DAILY 30 DAY SUPPLY COVER-YES CO-PAY-100 % DEDUCTIABLE  $ 3,150.00 OUT OF POCKET $6,250.00 MET NO WHEN MET, PATIENT WILL BE RESPONDABLE FOR 80 % TIER -3 DRUG PRIOR APPROVAL -NO PHARMACY - ANY RETAIL  06/23/14 Ellan Lambert, RN, BSN [phone]  Pt to dc on Effient therapy.  Will check insurance coverage.  Effient booklet with free trial card and copay card included.

## 2014-06-24 ENCOUNTER — Encounter (HOSPITAL_COMMUNITY): Payer: Self-pay | Admitting: Physician Assistant

## 2014-06-24 DIAGNOSIS — I2510 Atherosclerotic heart disease of native coronary artery without angina pectoris: Secondary | ICD-10-CM

## 2014-06-24 DIAGNOSIS — I214 Non-ST elevation (NSTEMI) myocardial infarction: Secondary | ICD-10-CM

## 2014-06-24 DIAGNOSIS — Z9861 Coronary angioplasty status: Secondary | ICD-10-CM

## 2014-06-24 LAB — BASIC METABOLIC PANEL
ANION GAP: 11 (ref 5–15)
BUN: 7 mg/dL (ref 6–23)
CO2: 26 meq/L (ref 19–32)
Calcium: 9 mg/dL (ref 8.4–10.5)
Chloride: 105 mEq/L (ref 96–112)
Creatinine, Ser: 0.91 mg/dL (ref 0.50–1.35)
GFR calc Af Amer: >90 mL/min (ref >90)
GFR calc non Af Amer: >90 mL/min (ref >90)
Glucose, Bld: 91 mg/dL (ref 70–99)
Potassium: 4.7 mEq/L (ref 3.7–5.3)
SODIUM: 142 meq/L (ref 137–147)

## 2014-06-24 LAB — CBC
HCT: 39.7 % (ref 39.0–52.0)
Hemoglobin: 13.2 g/dL (ref 13.0–17.0)
MCH: 29.9 pg (ref 26.0–34.0)
MCHC: 33.2 g/dL (ref 30.0–36.0)
MCV: 90 fL (ref 78.0–100.0)
PLATELETS: 201 10*3/uL (ref 150–400)
RBC: 4.41 MIL/uL (ref 4.22–5.81)
RDW: 13.2 % (ref 11.5–15.5)
WBC: 7.7 10*3/uL (ref 4.0–10.5)

## 2014-06-24 MED ORDER — METOPROLOL TARTRATE 12.5 MG HALF TABLET: 12.5000 mg | ORAL_TABLET | Freq: Two times a day (BID) | ORAL | Status: DC

## 2014-06-24 MED ORDER — PRASUGREL HCL 10 MG PO TABS: 10.0000 mg | ORAL_TABLET | Freq: Every day | ORAL | Status: DC

## 2014-06-24 MED ORDER — NITROGLYCERIN 0.4 MG SL SUBL: 0.4000 mg | SUBLINGUAL_TABLET | SUBLINGUAL | Status: DC | PRN

## 2014-06-24 MED ORDER — ASPIRIN 81 MG PO CHEW: 81.0000 mg | CHEWABLE_TABLET | Freq: Every day | ORAL | Status: DC

## 2014-06-24 MED ORDER — FENOFIBRATE 160 MG PO TABS: 160.0000 mg | ORAL_TABLET | Freq: Every day | ORAL | Status: DC

## 2014-06-24 MED ORDER — ATORVASTATIN CALCIUM 80 MG PO TABS: 80.0000 mg | ORAL_TABLET | Freq: Every day | ORAL | Status: DC

## 2014-06-24 MED ORDER — PANTOPRAZOLE SODIUM 40 MG PO TBEC: 40.0000 mg | DELAYED_RELEASE_TABLET | Freq: Every day | ORAL | Status: DC

## 2014-06-24 MED FILL — Sodium Chloride IV Soln 0.9%: INTRAVENOUS | Qty: 50 | Status: AC

## 2014-06-24 NOTE — Progress Notes (Signed)
CARDIAC REHAB PHASE I   Ed completed with good reception. Pt has already walked without problems. Interested in [place] and will send referral to [place] and [place] (not sure which one would be best with schedule). [phone]  Josephina Shih [place] CES, ACSM 06/24/2014 8:47 AM

## 2014-06-24 NOTE — Discharge Summary (Signed)
Discharge Summary   Patient ID: Brett Clark,  MRN: [id number], DOB/AGE: Feb 19, 1956 58 y.o.  Admit date: 06/22/2014 Discharge date: 06/24/2014  Primary Care Provider: [facility] Primary Cardiologist: Dr. Tamala Julian  Discharge Diagnoses Principal Problem:   Non-STEMI (non-ST elevated myocardial infarction) Active Problems:   Dyslipidemia, goal LDL below 70   Abnormal abdominal CT scan   Epigastric abdominal tenderness   CAD S/P PCI mLAD 2.5 x 18 mm Xience Alpine DES   Allergies Allergies  Allergen Reactions  . Codeine     headaches    Procedures  Echocardiogram 06/23/2014 LV EF: 50% -  55%  ------------------------------------------------------------------- Indications:   Dyspnea 786.09.  ------------------------------------------------------------------- History:  PMH: Epigastic abdominal tenderness. Risk factors: Dyslipidemia.  ------------------------------------------------------------------- Study Conclusions  - Left ventricle: The cavity size was normal. Wall thickness was increased in a pattern of mild LVH. Systolic function was normal. The estimated ejection fraction was in the range of 50% to 55%. There is distal anterior, apical and inferoapical hypokinesis, suggesting LA territory ischemia or infarct. Doppler parameters are consistent with abnormal left ventricular relaxation (grade 1 diastolic dysfunction). The E/e&' ratio is between 8-15, suggesting indeterminate LV filling pressure. - Mitral valve: Mildly thickened leaflets . There was mild regurgitation. - Left atrium: The atrium was normal in size.  Impressions:  - LVEF 50-55%, distal anterior, apical and inferoapical wall motion abnormality, consistent with LAD territory ischemia/infarct, diastolic dysfunction, likely elevated LV filling pressures, mild MR.    Cardiac catheterization 06/23/2014 PROCEDURAL FINDINGS Hemodynamics: AO 139/73 mean 99  mm Hg LV 138/16 mm Hg  Coronary angiography: Coronary dominance: right  Left mainstem: Normal  Left anterior descending (LAD): The mid LAD has an eccentric 80% lesion with some hypodensity. The first and second diagonal branches are normal.  Left circumflex (LCx): Normal  Right coronary artery (RCA): Normal.  Left ventriculography: Left ventricular systolic function is abnormal, there is akinesis of the anteroapical and inferoapical wall segments. LVEF is estimated at 40-45%, there is no significant mitral regurgitation   PCI Data: Vessel - LAD/Segment - mid Percent Stenosis (pre) 80% TIMI-flow 3 Stent 2.5 x 18 mm Xience Alpine Percent Stenosis (post) 0% TIMI-flow (post) 3  Final Conclusions:  1. Single vessel obstructive CAD 2. Mild to moderate LV dysfunction 3. Successful stenting of the mid LAD with DES   Recommendations:  DAPT for one year. Risk factor modification. Anticipate DC in am.    Hospital Course  The patient is a 58 year old previously healthy gentleman with no known chronic medical problem other than tobacco abuse. [place] [facility], he developed epigastric discomfort with tenderness. Evaluation at [facility] showed elevated troponin. EKG demonstrated evidence of anterior T-wave inversion concerning for ischemia. Cardiac rhythm was recommended at that time, but he left the hospital AMA and came to [facility] where he lived. On arrival, patient chest pain-free. EKG demonstrated left posterior hemiblock with anterior T-wave abnormality and poor R wave progression in anterior leads. Initial troponin was 6.06  After discussing treatment options, risk and benefit, it was decided for the patient to undergo cardiac catheterization. Echocardiogram was obtained on 06/23/2014 which showed EF 50-55%, anterior, apical and inferior apical hypokinesis consistent with LAD ischemia/infarct, mild MR. He underwent a scheduled cardiac  catheterization in the afternoon of 06/23/2014, EF estimated 40-45%, 80% eccentric mid LAD lesion treated with drug-eluting stent, no other coronary vessel disease noted.  Patient was seen the morning of 06/24/2014, at which time he denies any significant chest discomfort  or shortness breath. He is deemed stable for discharge from cardiology perspective. I will schedule follow-up with Dr. Tamala Julian. According to the patient, his epigastric abdominal discomfort largely been resolved after cardiac catheterization. He has been advised to seek medical attention with his PCP if epigastric pain recurs despite PCI treatment of NSTEMI.   Of note, patient also had complex abnormal lesion in the kidney and liver noted on CT at the outside hospital. He has been made aware of the finding and will need to followup with his PCP regarding this result. He denies recent unexplained weight loss.   Discharge Vitals Blood pressure 129/63, pulse 70, temperature 97.9 F (36.6 C), temperature source Oral, resp. rate 18, height 6' (1.829 m), weight 183 lb 6.8 oz (83.2 kg), SpO2 99 %.  Filed Weights   06/22/14 1959 06/23/14 0617 06/24/14 0007  Weight: 179 lb 12.8 oz (81.557 kg) 179 lb 3.2 oz (81.285 kg) 183 lb 6.8 oz (83.2 kg)   Physical Exam:  General: Pleasant, NAD Psych: Normal affect. Neuro: Alert and oriented X 3. Moves all extremities spontaneously. HEENT: Normal  Neck: Supple without bruits or JVD. Lungs:  Resp regular and unlabored, CTA. Heart: RRR no s3, s4, or murmurs. R radial cath site stable Abdomen: Soft, non-tender, non-distended, BS + x 4.  Extremities: No clubbing, cyanosis or edema. DP/PT/Radials 2+ and equal bilaterally.  Telemetry: NSR with HR 60-70s, occasional PVCs   Labs  CBC  Recent Labs  06/23/14 0120 06/24/14 0349  WBC 8.0 7.7  HGB 13.4 13.2  HCT 40.2 39.7  MCV 89.3 90.0  PLT 216 323   Basic Metabolic Panel  Recent Labs  06/23/14 0120 06/24/14 0349  NA 140 142  K 4.6  4.7  CL 103 105  CO2 27 26  GLUCOSE 105* 91  BUN 8 7  CREATININE 1.00 0.91  CALCIUM 9.2 9.0   Liver Function Tests  Recent Labs  06/22/14 1631  AST 38*  ALT 28  ALKPHOS 97  BILITOT 0.3  PROT 7.3  ALBUMIN 4.3    Recent Labs  06/22/14 1631  LIPASE 32   Cardiac Enzymes  Recent Labs  06/22/14 1631  TROPONINI 6.06*   Hemoglobin A1C  Recent Labs  06/22/14 2303  HGBA1C 5.8*   Fasting Lipid Panel  Recent Labs  06/23/14 0120  CHOL 192  HDL 21*  LDLCALC 94  TRIG 386*  CHOLHDL 9.1   Thyroid Function Tests  Recent Labs  06/22/14 2303  TSH 2.690    Disposition  Pt is being discharged home today in good condition.  Follow-up Plans & Appointments      Follow-up Information    Follow up with Sinclair Grooms, MD.   Specialty:  Cardiology   Why:  Office will call you to schedule followup, give [place] a call if you do not hear form [place] in 2 business days   Contact information:   [address] [address] [phone]       Discharge Medications    Medication List    STOP taking these medications        ADVIL 200 MG tablet  Generic drug:  ibuprofen      TAKE these medications        aspirin 81 MG chewable tablet  Chew 1 tablet (81 mg total) by mouth daily.     atorvastatin 80 MG tablet  Commonly known as:  LIPITOR  Take 1 tablet (80 mg total) by mouth daily  at 6 PM.     fenofibrate 160 MG tablet  Take 1 tablet (160 mg total) by mouth daily.     metoprolol tartrate 12.5 mg Tabs tablet  Commonly known as:  LOPRESSOR  Take 0.5 tablets (12.5 mg total) by mouth 2 (two) times daily.     pantoprazole 40 MG tablet  Commonly known as:  PROTONIX  Take 1 tablet (40 mg total) by mouth daily at 6 (six) AM.     prasugrel 10 MG Tabs tablet  Commonly known as:  EFFIENT  Take 1 tablet (10 mg total) by mouth daily.        Duration of Discharge Encounter   Greater than 30 minutes including physician  time.  Hilbert Corrigan PA-C Pager: [phone] 06/24/2014, 9:54 AM

## 2014-06-24 NOTE — Discharge Instructions (Signed)

## 2014-07-14 ENCOUNTER — Encounter (HOSPITAL_COMMUNITY): Payer: Self-pay | Admitting: Cardiology

## 2014-07-15 ENCOUNTER — Encounter: Payer: Self-pay | Admitting: Nurse Practitioner

## 2014-07-15 ENCOUNTER — Ambulatory Visit (INDEPENDENT_AMBULATORY_CARE_PROVIDER_SITE_OTHER): Payer: 59 | Admitting: Nurse Practitioner

## 2014-07-15 VITALS — BP 110/70 | HR 79 | Ht 72.0 in | Wt 179.0 lb

## 2014-07-15 DIAGNOSIS — Z720 Tobacco use: Secondary | ICD-10-CM

## 2014-07-15 DIAGNOSIS — E785 Hyperlipidemia, unspecified: Secondary | ICD-10-CM

## 2014-07-15 DIAGNOSIS — I222 Subsequent non-ST elevation (NSTEMI) myocardial infarction: Secondary | ICD-10-CM

## 2014-07-15 DIAGNOSIS — I255 Ischemic cardiomyopathy: Secondary | ICD-10-CM | POA: Insufficient documentation

## 2014-07-15 DIAGNOSIS — I2510 Atherosclerotic heart disease of native coronary artery without angina pectoris: Secondary | ICD-10-CM

## 2014-07-15 DIAGNOSIS — I214 Non-ST elevation (NSTEMI) myocardial infarction: Secondary | ICD-10-CM

## 2014-07-15 NOTE — Progress Notes (Signed)
Patient Name: Brett Clark Date of Encounter: 07/15/2014  Primary Care Provider:  Vena Austria, MD Primary Cardiologist:  Linard Millers, MD   Patient Profile  58 y/o male s/p recent nstemi who presents for f/u.  Problem List   Past Medical History  Diagnosis Date  . CAD (coronary artery disease)     a. 06/2014 NSTEMI/PCI: LM nl, LAD 80 (2.5x18 Xience DES), LCX nl, RCA nl, EF 40-45%.  Marland Kitchen Dyslipidemia   . Abnormal abdominal CT scan     a. Abnl lesion on kidney/liver noted on CT @ OSH [facility]) - no further details available.  . Ischemic cardiomyopathy     a. 06/2014 EF 40-45% by LV gram;  b. 06/2014 Echo: EF 50-55%, mild LVH, distal anterior, apical, and inferoapical wall motion abnormalities, DD, mild MR.  . Tobacco abuse    Past Surgical History  Procedure Laterality Date  . Back surgery    . Cardiac catheterization  06/23/2014    DES to mid LAD, single v disease  . Left heart catheterization with coronary angiogram N/A 06/23/2014    Procedure: LEFT HEART CATHETERIZATION WITH CORONARY ANGIOGRAM;  Surgeon: Peter M Martinique, MD;  Location: [facility] CATH LAB;  Service: Cardiovascular;  Laterality: N/A;    Allergies  Allergies  Allergen Reactions  . Codeine     headaches    HPI  58 y/o male who was recently admitted to [facility] for NSTEMI.  He initially presented to an OSH in [facility] with chest pain and apparently ruled in.  While there, he underwent abdominal imaging, which reportedly showed an abnl lesion on the liver and kidney with recommendation for f/u.  He was advised that he needed to be transferred to [facility] for further cardiac eval but he decided to leave AMA and present to [name] instead, since he lives in the [place] area.  At [facility], troponin was ~ 6.  He was admitted and underwent cath revealing severe LAD dzs with otw nl cors.  The LAD was successfully treated with a DES.  Echo showed nl EF.  He was d/c'd home and since has been doing well w/o chest pain or  dyspnea.  He has been walking regularly w/o limitations.  He has quit smoking and has been compliant with meds.  He denies chest pain, palpitations, dyspnea, pnd, orthopnea, n, v, dizziness, syncope, edema, weight gain, or early satiety.   Home Medications  Prior to Admission medications   Medication Sig Start Date End Date Taking? Authorizing Provider  aspirin 81 MG chewable tablet Chew 1 tablet (81 mg total) by mouth daily. 06/24/14  Yes Almyra Deforest, PA  atorvastatin (LIPITOR) 80 MG tablet Take 1 tablet (80 mg total) by mouth daily at 6 PM. 06/24/14  Yes Almyra Deforest, PA  fenofibrate 160 MG tablet Take 1 tablet (160 mg total) by mouth daily. 06/24/14  Yes Almyra Deforest, PA  metoprolol tartrate (LOPRESSOR) 12.5 mg TABS tablet Take 0.5 tablets (12.5 mg total) by mouth 2 (two) times daily. 06/24/14  Yes Almyra Deforest, PA  prasugrel (EFFIENT) 10 MG TABS tablet Take 1 tablet (10 mg total) by mouth daily. 06/24/14  Yes Almyra Deforest, PA  nitroGLYCERIN (NITROSTAT) 0.4 MG SL tablet Place 1 tablet (0.4 mg total) under the tongue every 5 (five) minutes x 3 doses as needed for chest pain. Patient not taking: Reported on 07/15/2014 06/24/14   Almyra Deforest, PA  pantoprazole (PROTONIX) 40 MG tablet Take 1 tablet (40 mg total) by mouth daily at  6 (six) AM. Patient not taking: Reported on 07/15/2014 06/24/14   Almyra Deforest, PA    Review of Systems  Doing well as above.  Notes occasional vivid dreams.  He denies chest pain, palpitations, dyspnea, pnd, orthopnea, n, v, dizziness, syncope, edema, weight gain, or early satiety.  All other systems reviewed and are otherwise negative except as noted above.  Physical Exam  Blood pressure 110/70, pulse 79, height 6' (1.829 m), weight 179 lb (81.194 kg).  General: Pleasant, NAD Psych: Normal affect. Neuro: Alert and oriented X 3. Moves all extremities spontaneously. HEENT: Normal  Neck: Supple without bruits or JVD. Lungs:  Resp regular and unlabored, CTA. Heart: RRR no s3, s4, or  murmurs. Abdomen: Soft, non-tender, non-distended, BS + x 4.  Extremities: No clubbing, cyanosis or edema. DP/PT/Radials 2+ and equal bilaterally.  R wrist cath site w/o bleeding/bruit/hematoma.  Accessory Clinical Findings  ECG - RSR, 79, right axis, RVH, lateral TWI - no significant changes.  Assessment & Plan  1.  NSTEMI, subsequent episode of care/CAD:  S/p recent admission and cath with PCI/DES to the LAD.  He has done well since d/c without recurrence of c/p or dyspnea.  He has been walking w/o limitations in activities. Cont asa, statin, bb, effient.  He is not planning on partaking in cardiac rehab.  2.  ICM:  EF 40-45% by LV gram, 50-55% by echo.  Euvolemic on exam.  Cont bb.  3.  HL:  LDL 94, TG 386.  Cont lipitor and fibrate.  F/U lipids/lft's in 8 wks.  4.  Tob abuse:  He has quit.  Continued cessation advised.  5.  Dispo:  F/U lipids/lft's and f/u with Dr. Tamala Julian in 8 wks.   Murray Hodgkins, NP 07/15/2014, 3:38 PM

## 2014-07-15 NOTE — Patient Instructions (Signed)
Your physician recommends that you continue on your current medications as directed. Please refer to the Current Medication list given to you today.  Your physician recommends that you schedule a follow-up appointment in: 8 weeks with Dr Tamala Julian, need fasting labs (lp/hfp) day or so before

## 2014-07-22 ENCOUNTER — Other Ambulatory Visit: Payer: Self-pay | Admitting: Physician Assistant

## 2014-09-12 ENCOUNTER — Other Ambulatory Visit: Payer: 59

## 2014-09-13 ENCOUNTER — Encounter: Payer: Self-pay | Admitting: Interventional Cardiology

## 2014-09-13 ENCOUNTER — Telehealth: Payer: Self-pay | Admitting: Interventional Cardiology

## 2014-09-13 ENCOUNTER — Ambulatory Visit (INDEPENDENT_AMBULATORY_CARE_PROVIDER_SITE_OTHER): Payer: 59 | Admitting: Interventional Cardiology

## 2014-09-13 VITALS — BP 138/82 | HR 74 | Ht 72.0 in | Wt 178.0 lb

## 2014-09-13 DIAGNOSIS — E785 Hyperlipidemia, unspecified: Secondary | ICD-10-CM

## 2014-09-13 DIAGNOSIS — K769 Liver disease, unspecified: Secondary | ICD-10-CM

## 2014-09-13 DIAGNOSIS — I255 Ischemic cardiomyopathy: Secondary | ICD-10-CM

## 2014-09-13 DIAGNOSIS — R935 Abnormal findings on diagnostic imaging of other abdominal regions, including retroperitoneum: Secondary | ICD-10-CM

## 2014-09-13 DIAGNOSIS — I214 Non-ST elevation (NSTEMI) myocardial infarction: Secondary | ICD-10-CM

## 2014-09-13 DIAGNOSIS — Z720 Tobacco use: Secondary | ICD-10-CM

## 2014-09-13 DIAGNOSIS — I2510 Atherosclerotic heart disease of native coronary artery without angina pectoris: Secondary | ICD-10-CM

## 2014-09-13 NOTE — Telephone Encounter (Signed)
The patient  had an abnormal CT scan done at [facility] in Nov 2015 that revealed a complex liver and kidney lesion. He needs to have a repeat CT angio of the abdomen with contrast to evaluate the liver and kidneys for possible cancer. This should be done soon. He prefers to have it done at [facility]. If it is done there a need to be sure that the results come to me. Our preferred that he have it done in [place] so that the results will be in Epic.

## 2014-09-13 NOTE — Patient Instructions (Signed)
Your physician has recommended you make the following change in your medication:  1) STOP Metoprolol  Your physician recommends that you return for a FASTING lipid profile and alt ASAP Please call back to schedule your fasting lab appt . Lab hours are Mon-Fri 7:30am-5:15pm  Your physician wants you to follow-up in: 8 months (Nov 2016) You will receive a reminder letter in the mail two months in advance. If you don't receive a letter, please call our office to schedule the follow-up appointment.

## 2014-09-13 NOTE — Progress Notes (Signed)
Cardiology Office Note   Date:  09/13/2014   ID:  Brett Clark 01/06/1956, MRN [id number]  PCP:  Vena Austria, MD  Cardiologist:   Sinclair Grooms, MD   No chief complaint on file.     History of Present Illness: Brett Clark is a 59 y.o. male who presents for who had a non-ST elevation myocardial infarction in November. He underwent stenting of the mid LAD. He has been asymptomatic since that time. Echocardiogram revealed an EF of 50%. Immediate LV gram at cath revealed EF of 40%. He has had no limitations in his back to full work. He does notice significant dreaming and insomnia since discharge from the hospital.    Past Medical History  Diagnosis Date  . CAD (coronary artery disease)     a. 06/2014 NSTEMI/PCI: LM nl, LAD 80 (2.5x18 Xience DES), LCX nl, RCA nl, EF 40-45%.  Marland Kitchen Dyslipidemia   . Abnormal abdominal CT scan     a. Abnl lesion on kidney/liver noted on CT @ OSH [facility]) - no further details available.  . Ischemic cardiomyopathy     a. 06/2014 EF 40-45% by LV gram;  b. 06/2014 Echo: EF 50-55%, mild LVH, distal anterior, apical, and inferoapical wall motion abnormalities, DD, mild MR.  . Tobacco abuse     Past Surgical History  Procedure Laterality Date  . Back surgery    . Cardiac catheterization  06/23/2014    DES to mid LAD, single v disease  . Left heart catheterization with coronary angiogram N/A 06/23/2014    Procedure: LEFT HEART CATHETERIZATION WITH CORONARY ANGIOGRAM;  Surgeon: Peter M Martinique, MD;  Location: [facility] CATH LAB;  Service: Cardiovascular;  Laterality: N/A;     Current Outpatient Prescriptions  Medication Sig Dispense Refill  . aspirin 81 MG chewable tablet Chew 1 tablet (81 mg total) by mouth daily.    Marland Kitchen atorvastatin (LIPITOR) 80 MG tablet Take 1 tablet (80 mg total) by mouth daily at 6 PM. 30 tablet 11  . EFFIENT 10 MG TABS tablet TAKE ONE TABLET BY MOUTH ONCE DAILY 30 tablet 3  . fenofibrate 160 MG tablet Take 1  tablet (160 mg total) by mouth daily. 30 tablet 11  . nitroGLYCERIN (NITROSTAT) 0.4 MG SL tablet Place 1 tablet (0.4 mg total) under the tongue every 5 (five) minutes x 3 doses as needed for chest pain. 25 tablet 0  . pantoprazole (PROTONIX) 40 MG tablet Take 1 tablet (40 mg total) by mouth daily at 6 (six) AM. 30 tablet 3   No current facility-administered medications for this visit.    Allergies:   Codeine    Social History:  The patient  reports that he has been smoking.  He does not have any smokeless tobacco history on file. He reports that he drinks alcohol.   Family History:  The patient's family history includes CAD in his father; COPD in his father.    ROS:  Please see the history of present illness.   Otherwise, review of systems are positive for easy bruising and bleeding due to Effient .   All other systems are reviewed and negative.    PHYSICAL EXAM: VS:  BP 138/82 mmHg  Pulse 74  Ht 6' (1.829 m)  Wt 178 lb (80.74 kg)  BMI 24.14 kg/m2 , BMI Body mass index is 24.14 kg/(m^2). GEN: Well nourished, well developed, in no acute distress HEENT: normal Neck: no JVD, carotid bruits, or masses Cardiac: RRR; no murmurs,  rubs, or gallops,no edema  Respiratory:  clear to auscultation bilaterally, normal work of breathing GI: soft, nontender, nondistended, + BS MS: no deformity or atrophy Skin: warm and dry, no rash Neuro:  Strength and sensation are intact Psych: euthymic mood, full affect   EKG:  EKG is not ordered today. The ekg ordered today demonstrates    Recent Labs: 06/22/2014: ALT 28; TSH 2.690 06/24/2014: BUN 7; Creatinine 0.91; Hemoglobin 13.2; Platelets 201; Potassium 4.7; Sodium 142    Lipid Panel    Component Value Date/Time   CHOL 192 06/23/2014 0120   TRIG 386* 06/23/2014 0120   HDL 21* 06/23/2014 0120   CHOLHDL 9.1 06/23/2014 0120   VLDL 77* 06/23/2014 0120   LDLCALC 94 06/23/2014 0120      Wt Readings from Last 3 Encounters:  09/13/14 178 lb  (80.74 kg)  07/15/14 179 lb (81.194 kg)  06/24/14 183 lb 6.8 oz (83.2 kg)      Other studies Reviewed: Additional studies/ records that were reviewed today include: . Review of the above records demonstrates:    ASSESSMENT AND PLAN:  1.  Insomnia likely related to metoprolol which we will discontinue. 2. Coronary artery disease with prior  LAD stent in the setting of non-ST elevation MI. Asymptomatic without angina  3. Hyperlipidemia on aggressive lipid-lowering regimen. We need to check a liver and lipid panel  4. Abnormal liver and kidney CT at [facility] in November 2015 needs follow-up.  Current medicines are reviewed at length with the patient today.  The patient has concerns regarding medicines.  The following changes have been made:  Discontinue metoprolol. We will check a lipid panel and liver panel soon. Consider switching Effient to Plavix in 6 months  Labs/ tests ordered today include:   Orders Placed This Encounter  Procedures  . Lipid panel  . ALT     Disposition: Brett Clark in 8 months   Signed, Sinclair Grooms, MD  09/13/2014 4:28 PM    [facility] [address], [place], [place]  [address] Phone: [phone]; Fax: [phone]

## 2014-09-14 NOTE — Telephone Encounter (Signed)
An order for CT of abdomen with contrast to be done at [address] office, and BMET placed in EPIC. Pt prefers to have it done next week, Tuesday or Wednesday early in AM of possible. Pt  is aware that someone will call him to scheduled test.

## 2014-09-21 ENCOUNTER — Other Ambulatory Visit (INDEPENDENT_AMBULATORY_CARE_PROVIDER_SITE_OTHER): Payer: 59 | Admitting: *Deleted

## 2014-09-21 DIAGNOSIS — E785 Hyperlipidemia, unspecified: Secondary | ICD-10-CM

## 2014-09-21 LAB — LIPID PANEL
CHOLESTEROL: 115 mg/dL (ref 0–200)
HDL: 27.5 mg/dL — ABNORMAL LOW (ref >39.00)
LDL CALC: 64 mg/dL (ref 0–99)
NonHDL: 87.5
Total CHOL/HDL Ratio: 4
Triglycerides: 119 mg/dL (ref 0.0–149.0)
VLDL: 23.8 mg/dL (ref 0.0–40.0)

## 2014-09-21 LAB — HEPATIC FUNCTION PANEL
ALBUMIN: 4.3 g/dL (ref 3.5–5.2)
ALK PHOS: 69 U/L (ref 39–117)
ALT: 18 U/L (ref 0–53)
AST: 17 U/L (ref 0–37)
Bilirubin, Direct: 0.1 mg/dL (ref 0.0–0.3)
Total Bilirubin: 0.5 mg/dL (ref 0.2–1.2)
Total Protein: 7 g/dL (ref 6.0–8.3)

## 2014-09-22 ENCOUNTER — Ambulatory Visit (INDEPENDENT_AMBULATORY_CARE_PROVIDER_SITE_OTHER)
Admission: RE | Admit: 2014-09-22 | Discharge: 2014-09-22 | Disposition: A | Discharge status: Home / Self Care | Payer: 59 | Source: Ambulatory Visit | Attending: Interventional Cardiology | Admitting: Interventional Cardiology

## 2014-09-22 DIAGNOSIS — K769 Liver disease, unspecified: Secondary | ICD-10-CM

## 2014-09-22 DIAGNOSIS — R935 Abnormal findings on diagnostic imaging of other abdominal regions, including retroperitoneum: Secondary | ICD-10-CM

## 2014-09-22 DIAGNOSIS — K7689 Other specified diseases of liver: Secondary | ICD-10-CM

## 2014-09-22 MED ORDER — IOHEXOL 300 MG/ML  SOLN
100.0000 mL | Freq: Once | INTRAMUSCULAR | Status: AC | PRN
  Administered 2014-09-22: 100 mL via INTRAVENOUS

## 2014-09-26 ENCOUNTER — Telehealth: Payer: Self-pay

## 2014-09-26 DIAGNOSIS — N2889 Other specified disorders of kidney and ureter: Secondary | ICD-10-CM

## 2014-09-26 NOTE — Telephone Encounter (Signed)
-----   Message from Sinclair Grooms, MD sent at 09/23/2014  9:39 PM EST ----- Kidney mass needs further evaluation. Needs to see Urologist ASAP.

## 2014-09-26 NOTE — Telephone Encounter (Signed)
Pt aware of abd Ct results. Kidney mass needs further evaluation. Needs to see Urologist ASAP.  adv pt a scheduler form our office will call him to schedule  Ref appt with [facility]  Pt verbalized understanding.

## 2014-09-27 ENCOUNTER — Telehealth: Payer: Self-pay

## 2014-09-27 DIAGNOSIS — E785 Hyperlipidemia, unspecified: Secondary | ICD-10-CM

## 2014-09-27 NOTE — Telephone Encounter (Signed)
Pt aware of lab results.  Lipids at [facility].  Pt verbalized understanding.

## 2014-09-27 NOTE — Telephone Encounter (Signed)
-----   Message from Sinclair Grooms, MD sent at 09/23/2014  9:18 PM EST ----- Lipids at [facility].

## 2014-10-26 ENCOUNTER — Telehealth: Payer: Self-pay | Admitting: Interventional Cardiology

## 2014-10-26 NOTE — Telephone Encounter (Signed)
I received an inquiry from Dr. Junious Silk about the feasibility of discontinuing Effient to allow kidney biopsy and possible minimally invasive kidney resection. He had a 2.5 x 18 Xience Alpine DES postdilated to 2.75 mm in diameter. The location of the stent is his mid to distal LAD.  Therefore, I believe it would be safe to transiently interrupted Effient therapy for up to a week prior to planned biopsy and/or resection of his kidney mass.  Assuming no recent episodes of chest discomfort or other complaints, he is cleared for the upcoming procedure.

## 2014-11-21 ENCOUNTER — Other Ambulatory Visit: Payer: Self-pay | Admitting: Urology

## 2014-11-22 ENCOUNTER — Telehealth: Payer: Self-pay | Admitting: Interventional Cardiology

## 2014-11-22 NOTE — Telephone Encounter (Signed)
Received medical records from [facility], sent to Dr. Daneen Schick. 11/22/14/ss

## 2014-11-28 ENCOUNTER — Telehealth: Payer: Self-pay | Admitting: Interventional Cardiology

## 2014-11-28 NOTE — Telephone Encounter (Signed)
It would be relatively safe to discontinue Effient 5-7 days prior to surgery and continue to hold the medication until safe after surgery. Other time surgery is scheduled to be performed, he will be 7 months post stent. Effient and aspirin should eventually be resumed.

## 2014-11-28 NOTE — Telephone Encounter (Signed)
Faxed note from Dr. Tamala Julian to [place] at [phone] at [facility]

## 2014-11-28 NOTE — Telephone Encounter (Signed)
Lm for Brett Clark from [facility] for Dr. Tresa Moore.  Scheduled for kidney surgery 01/18/15.  Wants to stop Effient 3-5 days prior to surgery and 2 weeks after surgery. Lm that Dr. Tamala Julian not in office today and will forward message to him and his nurse Lattie Haw Parris-Godley,CMA for recommendations.

## 2014-11-28 NOTE — Telephone Encounter (Signed)
New message       Request for surgical clearance:  1. What type of surgery is being performed? Kidney surgery  When is this surgery scheduled?  01-18-15 Are there any medications that need to be held prior to surgery and how long? effient----3-5 days prior and off for 2 weeks after surgery 2. Name of physician performing surgery?  Dr Tresa Moore  3. What is your office phone and fax number? Fax [phone]

## 2014-12-03 ENCOUNTER — Other Ambulatory Visit: Payer: Self-pay | Admitting: Nurse Practitioner

## 2014-12-06 ENCOUNTER — Telehealth: Payer: Self-pay

## 2014-12-06 NOTE — Telephone Encounter (Signed)
Cardiac clearance placed in MR nurse fax box to be faxed to [facility]

## 2015-01-12 ENCOUNTER — Other Ambulatory Visit (HOSPITAL_COMMUNITY): Payer: Self-pay | Admitting: *Deleted

## 2015-01-16 ENCOUNTER — Encounter (HOSPITAL_COMMUNITY): Payer: Self-pay

## 2015-01-16 ENCOUNTER — Encounter (HOSPITAL_COMMUNITY)
Admission: RE | Admit: 2015-01-16 | Discharge: 2015-01-16 | Disposition: A | Discharge status: Home / Self Care | Payer: 59 | Source: Ambulatory Visit | Attending: Urology | Admitting: Urology

## 2015-01-16 HISTORY — DX: Other specified disorders of kidney and ureter: N28.89

## 2015-01-16 HISTORY — DX: Headache: R51

## 2015-01-16 HISTORY — DX: Headache, unspecified: R51.9

## 2015-01-16 HISTORY — DX: Acute myocardial infarction, unspecified: I21.9

## 2015-01-16 LAB — CBC
HEMATOCRIT: 42.2 % (ref 39.0–52.0)
Hemoglobin: 13.8 g/dL (ref 13.0–17.0)
MCH: 29.1 pg (ref 26.0–34.0)
MCHC: 32.7 g/dL (ref 30.0–36.0)
MCV: 89 fL (ref 78.0–100.0)
Platelets: 386 10*3/uL (ref 150–400)
RBC: 4.74 MIL/uL (ref 4.22–5.81)
RDW: 13.2 % (ref 11.5–15.5)
WBC: 7 10*3/uL (ref 4.0–10.5)

## 2015-01-16 LAB — BASIC METABOLIC PANEL
Anion gap: 9 (ref 5–15)
BUN: 11 mg/dL (ref 6–20)
CALCIUM: 9.5 mg/dL (ref 8.9–10.3)
CO2: 30 mmol/L (ref 22–32)
Chloride: 102 mmol/L (ref 101–111)
Creatinine, Ser: 0.84 mg/dL (ref 0.61–1.24)
GLUCOSE: 91 mg/dL (ref 65–99)
Potassium: 4.4 mmol/L (ref 3.5–5.1)
Sodium: 141 mmol/L (ref 135–145)

## 2015-01-16 LAB — ABO/RH: ABO/RH(D): AB POS

## 2015-01-16 NOTE — Progress Notes (Addendum)
ECHO 06-23-14 EPIC EKG 07-15-14 EPIC  CHEST XRAY 06-22-14 EPIC CARDIAC CLEARANCE TELEPHONE NOTE DR Daneen Schick FAXED AND PLACED ON CHART  FROM DR [facility] OFFICE AND IN EPIC 11-28-14

## 2015-01-16 NOTE — Patient Instructions (Signed)
WESSLEY EMERT  01/16/2015  Your procedure is scheduled on: Wednesday 01-18-2015  Report to [facility] Main  Entrance and follow signs to               [place] at  630 am   Call this number if you have problems the morning of surgery [phone]   Remember: ONLY 1 PERSON MAY GO WITH YOU TO SHORT STAY TO GET  READY MORNING OF YOUR SURGERY.  Do not eat food  :After Midnight tonight, clear liquids all day Tuesday and follow all bowel pre instructions from Dr Tresa Moore, no clear liquids after midnight Tuesday night.     Take these medicines the morning of surgery with A SIP OF WATER: Claritin                               You may not have any metal on your body including hair pins and              piercings  Do not wear jewelry, make-up, lotions, powders or perfumes, deodorant             Do not wear nail polish.  Do not shave  48 hours prior to surgery.              Men may shave face and neck.   Do not bring valuables to the hospital. [place].  Contacts, dentures or bridgework may not be worn into surgery.  Leave suitcase in the car. After surgery it may be brought to your room.     Patients discharged the day of surgery will not be allowed to drive home.  Name and phone number of your driver:  Special Instructions: N/A              Please read over the following fact sheets you were given: _____________________________________________________________________             [facility] - Preparing for Surgery Before surgery, you can play an important role.  Because skin is not sterile, your skin needs to be as free of germs as possible.  You can reduce the number of germs on your skin by washing with CHG (chlorahexidine gluconate) soap before surgery.  CHG is an antiseptic cleaner which kills germs and bonds with the skin to continue killing germs even after washing. Please DO NOT use if you have an  allergy to CHG or antibacterial soaps.  If your skin becomes reddened/irritated stop using the CHG and inform your nurse when you arrive at Short Stay. Do not shave (including legs and underarms) for at least 48 hours prior to the first CHG shower.  You may shave your face/neck. Please follow these instructions carefully:  1.  Shower with CHG Soap the night before surgery and the  morning of Surgery.  2.  If you choose to wash your hair, wash your hair first as usual with your  normal  shampoo.  3.  After you shampoo, rinse your hair and body thoroughly to remove the  shampoo.                           4.  Use CHG as you  would any other liquid soap.  You can apply chg directly  to the skin and wash                       Gently with a scrungie or clean washcloth.  5.  Apply the CHG Soap to your body ONLY FROM THE NECK DOWN.   Do not use on face/ open                           Wound or open sores. Avoid contact with eyes, ears mouth and genitals (private parts).                       Wash face,  Genitals (private parts) with your normal soap.             6.  Wash thoroughly, paying special attention to the area where your surgery  will be performed.  7.  Thoroughly rinse your body with warm water from the neck down.  8.  DO NOT shower/wash with your normal soap after using and rinsing off  the CHG Soap.                9.  Pat yourself dry with a clean towel.            10.  Wear clean pajamas.            11.  Place clean sheets on your bed the night of your first shower and do not  sleep with pets. Day of Surgery : Do not apply any lotions/deodorants the morning of surgery.  Please wear clean clothes to the hospital/surgery center.  FAILURE TO FOLLOW THESE INSTRUCTIONS MAY RESULT IN THE CANCELLATION OF YOUR SURGERY PATIENT SIGNATURE_________________________________  NURSE  SIGNATURE__________________________________  ________________________________________________________________________    CLEAR LIQUID DIET   Foods Allowed                                                                     Foods Excluded  Coffee and tea, regular and decaf                             liquids that you cannot  Plain Jell-O in any flavor                                             see through such as: Fruit ices (not with fruit pulp)                                     milk, soups, orange juice  Iced Popsicles                                    All solid food Carbonated beverages, regular and diet  Cranberry, grape and apple juices Sports drinks like Gatorade Lightly seasoned clear broth or consume(fat free) Sugar, honey syrup  Sample Menu Breakfast                                Lunch                                     Supper Cranberry juice                    Beef broth                            Chicken broth Jell-O                                     Grape juice                           Apple juice Coffee or tea                        Jell-O                                      Popsicle                                                Coffee or tea                        Coffee or tea  _____________________________________________________________________

## 2015-01-18 ENCOUNTER — Inpatient Hospital Stay (HOSPITAL_COMMUNITY): Payer: 59 | Admitting: Anesthesiology

## 2015-01-18 ENCOUNTER — Encounter (HOSPITAL_COMMUNITY)
Admission: RE | Disposition: A | Discharge status: Home / Self Care | Payer: Self-pay | Source: Home / Self Care | Attending: Urology

## 2015-01-18 ENCOUNTER — Inpatient Hospital Stay (HOSPITAL_COMMUNITY)
Admission: RE | Admit: 2015-01-18 | Discharge: 2015-01-20 | DRG: 658 | Disposition: A | Discharge status: Home / Self Care | Payer: 59 | Attending: Urology | Admitting: Urology

## 2015-01-18 ENCOUNTER — Encounter (HOSPITAL_COMMUNITY): Payer: Self-pay | Admitting: *Deleted

## 2015-01-18 DIAGNOSIS — I2510 Atherosclerotic heart disease of native coronary artery without angina pectoris: Secondary | ICD-10-CM | POA: Diagnosis present

## 2015-01-18 DIAGNOSIS — Z955 Presence of coronary angioplasty implant and graft: Secondary | ICD-10-CM | POA: Diagnosis not present

## 2015-01-18 DIAGNOSIS — Z01812 Encounter for preprocedural laboratory examination: Secondary | ICD-10-CM

## 2015-01-18 DIAGNOSIS — E785 Hyperlipidemia, unspecified: Secondary | ICD-10-CM | POA: Diagnosis present

## 2015-01-18 DIAGNOSIS — Z825 Family history of asthma and other chronic lower respiratory diseases: Secondary | ICD-10-CM | POA: Diagnosis not present

## 2015-01-18 DIAGNOSIS — I252 Old myocardial infarction: Secondary | ICD-10-CM | POA: Diagnosis not present

## 2015-01-18 DIAGNOSIS — Z8249 Family history of ischemic heart disease and other diseases of the circulatory system: Secondary | ICD-10-CM | POA: Diagnosis not present

## 2015-01-18 DIAGNOSIS — C641 Malignant neoplasm of right kidney, except renal pelvis: Secondary | ICD-10-CM | POA: Diagnosis present

## 2015-01-18 DIAGNOSIS — Z87891 Personal history of nicotine dependence: Secondary | ICD-10-CM | POA: Diagnosis not present

## 2015-01-18 DIAGNOSIS — N2889 Other specified disorders of kidney and ureter: Secondary | ICD-10-CM | POA: Diagnosis present

## 2015-01-18 HISTORY — PX: ROBOTIC ASSITED PARTIAL NEPHRECTOMY: SHX6087

## 2015-01-18 LAB — HEMOGLOBIN AND HEMATOCRIT, BLOOD
HCT: 38.5 % — ABNORMAL LOW (ref 39.0–52.0)
HEMOGLOBIN: 12.3 g/dL — AB (ref 13.0–17.0)

## 2015-01-18 LAB — TYPE AND SCREEN
ABO/RH(D): AB POS
Antibody Screen: NEGATIVE

## 2015-01-18 SURGERY — ROBOTIC ASSITED PARTIAL NEPHRECTOMY
Anesthesia: General | Laterality: Right

## 2015-01-18 MED ORDER — HYDROMORPHONE HCL 1 MG/ML IJ SOLN
INTRAMUSCULAR | Status: AC
  Filled 2015-01-18: qty 1

## 2015-01-18 MED ORDER — HYDROCODONE-ACETAMINOPHEN 5-325 MG PO TABS: 1.0000 | ORAL_TABLET | Freq: Four times a day (QID) | ORAL | Status: DC | PRN

## 2015-01-18 MED ORDER — PROPOFOL 10 MG/ML IV BOLUS
INTRAVENOUS | Status: DC | PRN
  Administered 2015-01-18: 170 mg via INTRAVENOUS

## 2015-01-18 MED ORDER — PROPOFOL 10 MG/ML IV BOLUS
INTRAVENOUS | Status: AC
  Filled 2015-01-18: qty 20

## 2015-01-18 MED ORDER — MIDAZOLAM HCL 2 MG/2ML IJ SOLN
INTRAMUSCULAR | Status: AC
  Filled 2015-01-18: qty 2

## 2015-01-18 MED ORDER — DIPHENHYDRAMINE HCL 50 MG/ML IJ SOLN
INTRAMUSCULAR | Status: AC
  Filled 2015-01-18: qty 1

## 2015-01-18 MED ORDER — GLYCOPYRROLATE 0.2 MG/ML IJ SOLN
INTRAMUSCULAR | Status: DC | PRN
  Administered 2015-01-18: 0.6 mg via INTRAVENOUS

## 2015-01-18 MED ORDER — ROCURONIUM BROMIDE 100 MG/10ML IV SOLN
INTRAVENOUS | Status: AC
  Filled 2015-01-18: qty 1

## 2015-01-18 MED ORDER — EPHEDRINE SULFATE 50 MG/ML IJ SOLN
INTRAMUSCULAR | Status: DC | PRN
  Administered 2015-01-18 (×2): 5 mg via INTRAVENOUS

## 2015-01-18 MED ORDER — MANNITOL 25 % IV SOLN
25.0000 g | Freq: Once | INTRAVENOUS | Status: AC
  Administered 2015-01-18: 12.5 g via INTRAVENOUS
  Filled 2015-01-18: qty 100

## 2015-01-18 MED ORDER — LACTATED RINGERS IR SOLN
Status: DC | PRN
  Administered 2015-01-18: 1000 mL

## 2015-01-18 MED ORDER — ONDANSETRON HCL 4 MG/2ML IJ SOLN: 4.0000 mg | Freq: Four times a day (QID) | INTRAMUSCULAR | Status: DC | PRN

## 2015-01-18 MED ORDER — HYDROMORPHONE HCL 1 MG/ML IJ SOLN
0.2500 mg | INTRAMUSCULAR | Status: DC | PRN
  Administered 2015-01-18 (×4): 0.5 mg via INTRAVENOUS

## 2015-01-18 MED ORDER — STERILE WATER FOR IRRIGATION IR SOLN
Status: DC | PRN
Start: 2015-01-18 — End: 2015-01-18
  Administered 2015-01-18: 3000 mL

## 2015-01-18 MED ORDER — HYDROMORPHONE HCL 1 MG/ML IJ SOLN
0.5000 mg | INTRAMUSCULAR | Status: DC | PRN
  Administered 2015-01-18 – 2015-01-19 (×7): 1 mg via INTRAVENOUS
  Filled 2015-01-18 (×8): qty 1

## 2015-01-18 MED ORDER — ACETAMINOPHEN 500 MG PO TABS
1000.0000 mg | ORAL_TABLET | Freq: Four times a day (QID) | ORAL | Status: AC
  Administered 2015-01-18 – 2015-01-19 (×4): 1000 mg via ORAL
  Filled 2015-01-18 (×5): qty 2

## 2015-01-18 MED ORDER — ONDANSETRON HCL 4 MG/2ML IJ SOLN
INTRAMUSCULAR | Status: DC | PRN
  Administered 2015-01-18: 4 mg via INTRAVENOUS

## 2015-01-18 MED ORDER — LACTATED RINGERS IV SOLN
INTRAVENOUS | Status: DC | PRN
  Administered 2015-01-18 (×3): via INTRAVENOUS

## 2015-01-18 MED ORDER — HYDROMORPHONE HCL 1 MG/ML IJ SOLN
INTRAMUSCULAR | Status: DC | PRN
Start: 2015-01-18 — End: 2015-01-18
  Administered 2015-01-18: 0.5 mg via INTRAVENOUS
  Administered 2015-01-18: 1 mg via INTRAVENOUS
  Administered 2015-01-18: 0.5 mg via INTRAVENOUS

## 2015-01-18 MED ORDER — FENTANYL CITRATE (PF) 250 MCG/5ML IJ SOLN
INTRAMUSCULAR | Status: AC
  Filled 2015-01-18: qty 5

## 2015-01-18 MED ORDER — LIDOCAINE HCL (CARDIAC) 20 MG/ML IV SOLN
INTRAVENOUS | Status: AC
  Filled 2015-01-18: qty 5

## 2015-01-18 MED ORDER — HYDROMORPHONE HCL 2 MG/ML IJ SOLN
INTRAMUSCULAR | Status: AC
  Filled 2015-01-18: qty 1

## 2015-01-18 MED ORDER — OXYCODONE HCL 5 MG/5ML PO SOLN
5.0000 mg | Freq: Once | ORAL | Status: DC | PRN
  Filled 2015-01-18: qty 5

## 2015-01-18 MED ORDER — BUPIVACAINE LIPOSOME 1.3 % IJ SUSP
20.0000 mL | Freq: Once | INTRAMUSCULAR | Status: AC
  Administered 2015-01-18: 20 mL
  Filled 2015-01-18: qty 20

## 2015-01-18 MED ORDER — HYDROMORPHONE HCL 1 MG/ML IJ SOLN
INTRAMUSCULAR | Status: AC
Start: 2015-01-18 — End: 2015-01-19
  Filled 2015-01-18: qty 1

## 2015-01-18 MED ORDER — EPHEDRINE SULFATE 50 MG/ML IJ SOLN
INTRAMUSCULAR | Status: AC
  Filled 2015-01-18: qty 1

## 2015-01-18 MED ORDER — FENTANYL CITRATE (PF) 100 MCG/2ML IJ SOLN
INTRAMUSCULAR | Status: DC | PRN
  Administered 2015-01-18: 100 ug via INTRAVENOUS
  Administered 2015-01-18 (×3): 50 ug via INTRAVENOUS

## 2015-01-18 MED ORDER — OXYCODONE HCL 5 MG PO TABS
5.0000 mg | ORAL_TABLET | ORAL | Status: DC | PRN
  Administered 2015-01-19 – 2015-01-20 (×3): 5 mg via ORAL
  Filled 2015-01-18 (×3): qty 1

## 2015-01-18 MED ORDER — NITROGLYCERIN 0.4 MG SL SUBL: 0.4000 mg | SUBLINGUAL_TABLET | SUBLINGUAL | Status: DC | PRN

## 2015-01-18 MED ORDER — LIDOCAINE HCL (CARDIAC) 20 MG/ML IV SOLN
INTRAVENOUS | Status: DC | PRN
  Administered 2015-01-18: 60 mg via INTRAVENOUS

## 2015-01-18 MED ORDER — OXYCODONE HCL 5 MG PO TABS: 5.0000 mg | ORAL_TABLET | Freq: Once | ORAL | Status: DC | PRN

## 2015-01-18 MED ORDER — GLYCOPYRROLATE 0.2 MG/ML IJ SOLN
INTRAMUSCULAR | Status: AC
  Filled 2015-01-18: qty 3

## 2015-01-18 MED ORDER — SODIUM CHLORIDE 0.9 % IJ SOLN
INTRAMUSCULAR | Status: AC
  Filled 2015-01-18: qty 20

## 2015-01-18 MED ORDER — NEOSTIGMINE METHYLSULFATE 10 MG/10ML IV SOLN
INTRAVENOUS | Status: AC
  Filled 2015-01-18: qty 1

## 2015-01-18 MED ORDER — ATORVASTATIN CALCIUM 80 MG PO TABS
80.0000 mg | ORAL_TABLET | Freq: Every day | ORAL | Status: DC
  Administered 2015-01-18 – 2015-01-19 (×2): 80 mg via ORAL
  Filled 2015-01-18 (×3): qty 1

## 2015-01-18 MED ORDER — CEFAZOLIN SODIUM-DEXTROSE 2-3 GM-% IV SOLR
INTRAVENOUS | Status: AC
  Filled 2015-01-18: qty 50

## 2015-01-18 MED ORDER — SODIUM CHLORIDE 0.9 % IJ SOLN
INTRAMUSCULAR | Status: AC
  Filled 2015-01-18: qty 10

## 2015-01-18 MED ORDER — ROCURONIUM BROMIDE 100 MG/10ML IV SOLN
INTRAVENOUS | Status: DC | PRN
  Administered 2015-01-18: 10 mg via INTRAVENOUS
  Administered 2015-01-18: 20 mg via INTRAVENOUS
  Administered 2015-01-18 (×3): 10 mg via INTRAVENOUS
  Administered 2015-01-18: 50 mg via INTRAVENOUS
  Administered 2015-01-18: 10 mg via INTRAVENOUS

## 2015-01-18 MED ORDER — NEOSTIGMINE METHYLSULFATE 10 MG/10ML IV SOLN
INTRAVENOUS | Status: DC | PRN
  Administered 2015-01-18: 4 mg via INTRAVENOUS

## 2015-01-18 MED ORDER — INDOCYANINE GREEN 25 MG IV SOLR
INTRAVENOUS | Status: DC | PRN
  Administered 2015-01-18: 5 mg via INTRAVENOUS

## 2015-01-18 MED ORDER — FENOFIBRATE 160 MG PO TABS
160.0000 mg | ORAL_TABLET | Freq: Every day | ORAL | Status: DC
  Administered 2015-01-19: 160 mg via ORAL
  Filled 2015-01-18 (×2): qty 1

## 2015-01-18 MED ORDER — MIDAZOLAM HCL 5 MG/5ML IJ SOLN
INTRAMUSCULAR | Status: DC | PRN
  Administered 2015-01-18: 2 mg via INTRAVENOUS

## 2015-01-18 MED ORDER — CEFAZOLIN SODIUM-DEXTROSE 2-3 GM-% IV SOLR
2.0000 g | INTRAVENOUS | Status: AC
  Administered 2015-01-18 (×2): 2 g via INTRAVENOUS

## 2015-01-18 MED ORDER — DEXTROSE-NACL 5-0.45 % IV SOLN
INTRAVENOUS | Status: DC
  Administered 2015-01-18 – 2015-01-19 (×3): via INTRAVENOUS

## 2015-01-18 SURGICAL SUPPLY — 67 items
APL ESCP 34 STRL LF DISP (HEMOSTASIS) ×1
APPLICATOR SURGIFLO ENDO (HEMOSTASIS) ×2 IMPLANT
BAG SPEC RTRVL LRG 6X4 10 (ENDOMECHANICALS)
CABLE HIGH FREQUENCY MONO STRZ (ELECTRODE) ×2 IMPLANT
CHLORAPREP W/TINT 26ML (MISCELLANEOUS) ×2 IMPLANT
CLIP LIGATING HEM O LOK PURPLE (MISCELLANEOUS) ×2 IMPLANT
CLIP LIGATING HEMO LOK XL GOLD (MISCELLANEOUS) IMPLANT
CLIP LIGATING HEMO O LOK GREEN (MISCELLANEOUS) ×5 IMPLANT
CLIP SUT LAPRA TY ABSORB (SUTURE) ×4 IMPLANT
CORDS BIPOLAR (ELECTRODE) ×2 IMPLANT
COVER SURGICAL LIGHT HANDLE (MISCELLANEOUS) ×2 IMPLANT
COVER TIP SHEARS 8 DVNC (MISCELLANEOUS) ×1 IMPLANT
COVER TIP SHEARS 8MM DA VINCI (MISCELLANEOUS) ×1
CUTTER ECHEON FLEX ENDO 45 340 (ENDOMECHANICALS) IMPLANT
DECANTER SPIKE VIAL GLASS SM (MISCELLANEOUS) ×1 IMPLANT
DRAIN CHANNEL 15F RND FF 3/16 (WOUND CARE) ×2 IMPLANT
DRAPE INCISE IOBAN 66X45 STRL (DRAPES) ×2 IMPLANT
DRAPE LAPAROSCOPIC ABDOMINAL (DRAPES) ×2 IMPLANT
DRAPE SHEET LG 3/4 BI-LAMINATE (DRAPES) ×2 IMPLANT
DRAPE TABLE BACK 44X90 PK DISP (DRAPES) ×2 IMPLANT
DRAPE WARM FLUID 44X44 (DRAPE) ×2 IMPLANT
ELECT REM PT RETURN 9FT ADLT (ELECTROSURGICAL) ×2
ELECTRODE REM PT RTRN 9FT ADLT (ELECTROSURGICAL) ×1 IMPLANT
EVACUATOR SILICONE 100CC (DRAIN) ×2 IMPLANT
GLOVE BIO SURGEON STRL SZ 6.5 (GLOVE) ×2 IMPLANT
GLOVE BIOGEL M STRL SZ7.5 (GLOVE) ×4 IMPLANT
GOWN STRL REUS W/TWL LRG LVL3 (GOWN DISPOSABLE) ×4 IMPLANT
HEMOSTAT SURGICEL 4X8 (HEMOSTASIS) ×5 IMPLANT
KIT ACCESSORY DA VINCI DISP (KITS) ×1
KIT ACCESSORY DVNC DISP (KITS) ×1 IMPLANT
KIT BASIN OR (CUSTOM PROCEDURE TRAY) ×2 IMPLANT
LIQUID BAND (GAUZE/BANDAGES/DRESSINGS) ×2 IMPLANT
LOOP VESSEL MAXI BLUE (MISCELLANEOUS) ×2 IMPLANT
NEEDLE INSUFFLATION 14GA 120MM (NEEDLE) ×2 IMPLANT
NS IRRIG 1000ML POUR BTL (IV SOLUTION) ×1 IMPLANT
PEN SKIN MARKING BROAD (MISCELLANEOUS) ×2 IMPLANT
PENCIL BUTTON HOLSTER BLD 10FT (ELECTRODE) ×2 IMPLANT
PORT ACCESS TROCAR AIRSEAL 12 (TROCAR) IMPLANT
PORT ACCESS TROCAR AIRSEAL 5M (TROCAR) ×1
POSITIONER SURGICAL ARM (MISCELLANEOUS) ×4 IMPLANT
POUCH SPECIMEN RETRIEVAL 10MM (ENDOMECHANICALS) ×1 IMPLANT
RELOAD WH ECHELON 45 (STAPLE) IMPLANT
SET TRI-LUMEN FLTR TB AIRSEAL (TUBING) ×1 IMPLANT
SET TUBE IRRIG SUCTION NO TIP (IRRIGATION / IRRIGATOR) ×1 IMPLANT
SOLUTION ELECTROLUBE (MISCELLANEOUS) ×2 IMPLANT
SPONGE LAP 4X18 X RAY DECT (DISPOSABLE) ×2 IMPLANT
SURGIFLO W/THROMBIN 8M KIT (HEMOSTASIS) ×2 IMPLANT
SUT ETHILON 3 0 PS 1 (SUTURE) ×2 IMPLANT
SUT MNCRL AB 4-0 PS2 18 (SUTURE) ×4 IMPLANT
SUT PDS AB 1 CT1 27 (SUTURE) ×4 IMPLANT
SUT V-LOC BARB 180 2/0GR6 GS22 (SUTURE)
SUT VIC AB 0 CT1 27 (SUTURE) ×10
SUT VIC AB 0 CT1 27XBRD ANTBC (SUTURE) ×5 IMPLANT
SUT VIC AB 2-0 SH 27 (SUTURE) ×4
SUT VIC AB 2-0 SH 27X BRD (SUTURE) ×2 IMPLANT
SUT VLOC BARB 180 ABS3/0GR12 (SUTURE) ×2
SUTURE V-LC BRB 180 2/0GR6GS22 (SUTURE) IMPLANT
SUTURE VLOC BRB 180 ABS3/0GR12 (SUTURE) ×1 IMPLANT
TOWEL OR 17X26 10 PK STRL BLUE (TOWEL DISPOSABLE) ×4 IMPLANT
TOWEL OR NON WOVEN STRL DISP B (DISPOSABLE) ×2 IMPLANT
TRAY FOLEY W/METER SILVER 14FR (SET/KITS/TRAYS/PACK) ×2 IMPLANT
TRAY LAPAROSCOPIC (CUSTOM PROCEDURE TRAY) ×2 IMPLANT
TROCAR 12M 150ML BLUNT (TROCAR) ×2 IMPLANT
TROCAR BLADELESS OPT 5 100 (ENDOMECHANICALS) IMPLANT
TROCAR UNIVERSAL OPT 12M 100M (ENDOMECHANICALS) ×2 IMPLANT
TROCAR XCEL 12X100 BLDLESS (ENDOMECHANICALS) ×2 IMPLANT
WATER STERILE IRR 1500ML POUR (IV SOLUTION) ×2 IMPLANT

## 2015-01-18 NOTE — Brief Op Note (Signed)
01/18/2015  12:29 PM  PATIENT:  Brett Clark  59 y.o. male  PRE-OPERATIVE DIAGNOSIS:  RIGHT RENAL MASS  POST-OPERATIVE DIAGNOSIS:  RIGHT RENAL MASS  PROCEDURE:  Procedure(s): ROBOTIC ASSITED PARTIAL NEPHRECTOMY, INTRAOPERATIVE ULTRASOUND, AND INDOCYANINE GREEN DYE (Right)  SURGEON:  Surgeon(s) and Role:    * Alexis Frock, MD - Primary  PHYSICIAN ASSISTANT:   ASSISTANTS: 1 - Clemetine Marker, PA   ANESTHESIA:   local and general  EBL:  Total I/O In: 2000 [I.V.:2000] Out: 335 [Urine:185; Blood:150]  BLOOD ADMINISTERED:none  DRAINS: 1 -JP to bulb, 2 - Foley to gravity   LOCAL MEDICATIONS USED:  MARCAINE     SPECIMEN:  Source of Specimen:  1 - fat around tumor (frozen section negative for carcinoma); 2 - right renal mass  DISPOSITION OF SPECIMEN:  PATHOLOGY  COUNTS:  YES  TOURNIQUET:  * No tourniquets in log *  DICTATION: .Other Dictation: Dictation Number [id number]  PLAN OF CARE: Admit to inpatient   PATIENT DISPOSITION:  PACU - hemodynamically stable.   Delay start of Pharmacological VTE agent (>24hrs) due to surgical blood loss or risk of bleeding: yes

## 2015-01-18 NOTE — Anesthesia Procedure Notes (Signed)
Procedure Name: Intubation Date/Time: 01/18/2015 8:28 AM Performed by: Glory Buff Pre-anesthesia Checklist: Patient identified, Emergency Drugs available, Suction available and Patient being monitored Patient Re-evaluated:Patient Re-evaluated prior to inductionOxygen Delivery Method: Circle System Utilized Preoxygenation: Pre-oxygenation with 100% oxygen Intubation Type: IV induction Ventilation: Mask ventilation without difficulty Laryngoscope Size: Miller and 3 Grade View: Grade I Tube type: Oral Tube size: 8.0 mm Number of attempts: 1 Airway Equipment and Method: Stylet and Oral airway Placement Confirmation: ETT inserted through vocal cords under direct vision,  positive ETCO2 and breath sounds checked- equal and bilateral Secured at: 22 cm Tube secured with: Tape Dental Injury: Teeth and Oropharynx as per pre-operative assessment

## 2015-01-18 NOTE — Discharge Instructions (Signed)
1.  Activity:  You are encouraged to ambulate frequently (about every hour during waking hours) to help prevent blood clots from forming in your legs or lungs.  However, you should not engage in any heavy lifting (> 10-15 lbs), strenuous activity, or straining. 2. Diet: You should advance your diet as instructed by your physician.  It will be normal to have some bloating, nausea, and abdominal discomfort intermittently. 3. Prescriptions:  You will be provided a prescription for pain medication to take as needed.  If your pain is not severe enough to require the prescription pain medication, you may take extra strength Tylenol instead which will have less side effects.  You should also take a prescribed stool softener to avoid straining with bowel movements as the prescription pain medication may constipate you. 4. Incisions: You may remove your dressing bandages 48 hours after surgery if not removed in the hospital.  You will either have some small staples or special tissue glue at each of the incision sites. Once the bandages are removed (if present), the incisions may stay open to air.  You may start showering (but not soaking or bathing in water) the 2nd day after surgery and the incisions simply need to be patted dry after the shower.  No additional care is needed. 5. What to call us about: You should call the office [phone]) if you develop fever > 101 or develop persistent vomiting.       6.  You may resume Effient, aspirin, advil, aleve, vitamins, and supplements 7 days after surgery.

## 2015-01-18 NOTE — H&P (Signed)
Reason for Consult: Pre-OP Right Robotic Partial Nephrectomy  Brett Clark is an 59 y.o. male.   HPI:   1 - Right Renal Mass - 3cm solid-cystic, enhancing posterior-mid mass initially incidetnal on CT 06/2014 at time of NSTEMI and confirmed by dedicated contrast scan 10/2014. 2 artery (one behind vein, one just inferior, both with branches), 2 vein (one normal position, another accessory entering posterior) renovascular anatomy. NO additional lesions. Borderline 35m interaorto-caval node around celiac, but no renal hilar adenopathy.  Dr. HMallie Musselhas felt reasonable to come off effient for mass resection if needed.   PMH sig for CAD/MI/Stent (placed 06/2014, now follows Dr. HMallie Clark cardiology, no present limitations), hydrocelectomy. His PCP is RNatividad Clark.   Today " Brett Clark" is seen to proceed with right robotic partial nephrectomy.   Past Medical History  Diagnosis Date  . CAD (coronary artery disease)     a. 06/2014 NSTEMI/PCI: LM nl, LAD 80 (2.5x18 Xience DES), LCX nl, RCA nl, EF 40-45%.  .Marland KitchenDyslipidemia   . Abnormal abdominal CT scan     a. Abnl lesion on kidney/liver noted on CT @ OSH ([facility] - no further details available.  . Ischemic cardiomyopathy     a. 06/2014 EF 40-45% by LV gram;  b. 06/2014 Echo: EF 50-55%, mild LVH, distal anterior, apical, and inferoapical wall motion abnormalities, DD, mild MR.  . Tobacco abuse   . Right renal mass   . Myocardial infarction nov 2015  . Headache     migraine and tension    Past Surgical History  Procedure Laterality Date  . Cardiac catheterization  06/23/2014    DES to mid LAD, single v disease  . Left heart catheterization with coronary angiogram N/A 06/23/2014    Procedure: LEFT HEART CATHETERIZATION WITH CORONARY ANGIOGRAM;  Surgeon: Peter M JMartinique MD;  [facility];  Service: Cardiovascular;  Laterality: N/A;  . Back surgery  years ago    neck  . Hydrocele surgery  15 years ago    Family History   Problem Relation Age of Onset  . CAD Father   . COPD Father     Social History:  reports that he has quit smoking. His smoking use included Cigarettes. He has a 20 pack-year smoking history. He has never used smokeless tobacco. He reports that he drinks alcohol. His drug history is not on file.  Allergies:  Allergies  Allergen Reactions  . Codeine     headaches    Medications: I have reviewed the patient's current medications.  Results for orders placed or performed during the hospital encounter of 01/16/15 (from the past 48 hour(s))  Basic metabolic panel     Status: None   Collection Time: 01/16/15  9:50 AM  Result Value Ref Range   Sodium 141 135 - 145 mmol/L   Potassium 4.4 3.5 - 5.1 mmol/L   Chloride 102 101 - 111 mmol/L   CO2 30 22 - 32 mmol/L   Glucose, Bld 91 65 - 99 mg/dL   BUN 11 6 - 20 mg/dL   Creatinine, Ser 0.84 0.61 - 1.24 mg/dL   Calcium 9.5 8.9 - 10.3 mg/dL   GFR calc non Af Amer >60 >60 mL/min   GFR calc Af Amer >60 >60 mL/min    Comment: (NOTE) The eGFR has been calculated using the CKD EPI equation. This calculation has not been validated in all clinical situations. eGFR's persistently <60 mL/min signify possible Chronic Kidney Disease.  Anion gap 9 5 - 15  CBC     Status: None   Collection Time: 01/16/15  9:50 AM  Result Value Ref Range   WBC 7.0 4.0 - 10.5 K/uL   RBC 4.74 4.22 - 5.81 MIL/uL   Hemoglobin 13.8 13.0 - 17.0 g/dL   HCT 42.2 39.0 - 52.0 %   MCV 89.0 78.0 - 100.0 fL   MCH 29.1 26.0 - 34.0 pg   MCHC 32.7 30.0 - 36.0 g/dL   RDW 13.2 11.5 - 15.5 %   Platelets 386 150 - 400 K/uL  Type and screen     Status: None   Collection Time: 01/16/15  9:50 AM  Result Value Ref Range   ABO/RH(D) AB POS    Antibody Screen NEG    Sample Expiration 01/30/2015   ABO/Rh     Status: None   Collection Time: 01/16/15  9:50 AM  Result Value Ref Range   ABO/RH(D) AB POS     No results found.  Review of Systems  Constitutional: Negative.    HENT: Negative.   Eyes: Negative.   Respiratory: Negative.   Cardiovascular: Negative.   Gastrointestinal: Negative.   Genitourinary: Negative.   Musculoskeletal: Negative.   Skin: Negative.   Neurological: Negative.   Endo/Heme/Allergies: Negative.   Psychiatric/Behavioral: Negative.    Blood pressure 116/65, pulse 68, temperature 98 F (36.7 C), temperature source Oral, resp. rate 18, SpO2 100 %. Physical Exam  Constitutional: He appears well-developed.  HENT:  Head: Normocephalic.  Eyes: Pupils are equal, round, and reactive to light.  Neck: Normal range of motion.  Cardiovascular: Normal rate.   Respiratory: Effort normal.  GI: Soft.  Genitourinary:  No CVAT  Musculoskeletal: Normal range of motion.  Neurological: He is alert.  Skin: Skin is warm.  Psychiatric: He has a normal mood and affect. His behavior is normal. Judgment and thought content normal.    Assessment/Plan:     1 - Right Renal Mass - likely clinicall localized renal cell carcinoma. Borderline adenopathy not in most typical location for mets, favor surveillance of that area. He has been off efffient per cardiology recs with plan ot hold approx 2 weeks.   We rediscussed the role of partial nephrectomy with the overall goals being a balance of trying to achieve complete surgical excision (negative margins) while minimizing loss of normally functioning kidney. We then rediscussed surgical approaches including robotic and open techniques with robotic associated with a shorter convalescence. I showed the patient on their abdomen the approximately 4-6 incision (trocar) sites as well as presumed extraction sites with robotic approach as well as possible open incision sites. We specifically readdressed that there may be need to alter operative plans according to intraopertive findings including conversion to open procedure or conversion to radical nephrectomy as well as need for adjunctive procedures such as ureteral  stenting to promote correct renal healing. We rediscussed specific peri-operative risks including bleeding, infection, deep vein thrombosis, pulmonary embolism, compartment syndrome, neuropathy / neuropraxia, heart attack, stroke, death, as well as long-term risks such as non-cure / need for additional therapy and need for imaging and lab based post-op surveillance protocols. We rediscussed typical hospital course of approximately 2 day hospitalization, need for peri-operative drains / catheters, and typical post-hospital course with return to most non-strenuous activities by 2 weeks and ability to return to most jobs and more strenuous activity such as exercise by 6 weeks.   Legacy Carrender 01/18/2015, 6:37 AM

## 2015-01-18 NOTE — Transfer of Care (Signed)
Immediate Anesthesia Transfer of Care Note  Patient: Brett Clark  Procedure(s) Performed: Procedure(s): ROBOTIC ASSITED PARTIAL NEPHRECTOMY, INTRAOPERATIVE ULTRASOUND, AND INDOCYANINE GREEN DYE (Right)  Patient Location: PACU  Anesthesia Type:General  Level of Consciousness: awake, alert  and oriented  Airway & Oxygen Therapy: Patient Spontanous Breathing and Patient connected to face mask oxygen  Post-op Assessment: Report given to RN and Post -op Vital signs reviewed and stable  Post vital signs: Reviewed and stable  Last Vitals:  Filed Vitals:   01/18/15 0632  BP: 116/65  Pulse: 68  Temp: 36.7 C  Resp: 18    Complications: No apparent anesthesia complications

## 2015-01-18 NOTE — Anesthesia Preprocedure Evaluation (Addendum)
Anesthesia Evaluation  Patient identified by MRN, date of birth, ID band Patient awake    Reviewed: Allergy & Precautions, NPO status , Patient's Chart, lab work & pertinent test results  Airway Mallampati: II   Neck ROM: full    Dental   Pulmonary former smoker,  breath sounds clear to auscultation        Cardiovascular + CAD, + Past MI and + Cardiac Stents Rhythm:regular Rate:Normal  06/2014 NSTEMI/PCI: LM nl, LAD 80 (2.5x18 Xience DES), LCX nl, RCA nl, EF 40-45%.   Neuro/Psych  Headaches,    GI/Hepatic   Endo/Other    Renal/GU      Musculoskeletal   Abdominal   Peds  Hematology   Anesthesia Other Findings   Reproductive/Obstetrics                            Anesthesia Physical Anesthesia Plan  ASA: III  Anesthesia Plan: General   Post-op Pain Management:    Induction: Intravenous  Airway Management Planned: Oral ETT  Additional Equipment:   Intra-op Plan:   Post-operative Plan: Extubation in OR  Informed Consent: I have reviewed the patients History and Physical, chart, labs and discussed the procedure including the risks, benefits and alternatives for the proposed anesthesia with the patient or authorized representative who has indicated his/her understanding and acceptance.     Plan Discussed with: CRNA, Anesthesiologist and Surgeon  Anesthesia Plan Comments:         Anesthesia Quick Evaluation

## 2015-01-18 NOTE — Anesthesia Postprocedure Evaluation (Signed)
Anesthesia Post Note  Patient: Brett Clark  Procedure(s) Performed: Procedure(s) (LRB): ROBOTIC ASSITED PARTIAL NEPHRECTOMY, INTRAOPERATIVE ULTRASOUND, AND INDOCYANINE GREEN DYE (Right)  Anesthesia type: General  Patient location: PACU  Post pain: Pain level controlled and Adequate analgesia  Post assessment: Post-op Vital signs reviewed, Patient's Cardiovascular Status Stable, Respiratory Function Stable, Patent Airway and Pain level controlled  Last Vitals:  Filed Vitals:   01/18/15 1300  BP: 139/66  Pulse: 75  Temp:   Resp: 12    Post vital signs: Reviewed and stable  Level of consciousness: awake, alert  and oriented  Complications: No apparent anesthesia complications

## 2015-01-19 ENCOUNTER — Encounter (HOSPITAL_COMMUNITY): Payer: Self-pay | Admitting: Urology

## 2015-01-19 LAB — BASIC METABOLIC PANEL
Anion gap: 4 — ABNORMAL LOW (ref 5–15)
BUN: 11 mg/dL (ref 6–20)
CO2: 28 mmol/L (ref 22–32)
Calcium: 8.4 mg/dL — ABNORMAL LOW (ref 8.9–10.3)
Chloride: 107 mmol/L (ref 101–111)
Creatinine, Ser: 1.03 mg/dL (ref 0.61–1.24)
GFR calc non Af Amer: >60 mL/min (ref >60)
Glucose, Bld: 116 mg/dL — ABNORMAL HIGH (ref 65–99)
POTASSIUM: 3.7 mmol/L (ref 3.5–5.1)
Sodium: 139 mmol/L (ref 135–145)

## 2015-01-19 LAB — HEMOGLOBIN AND HEMATOCRIT, BLOOD
HCT: 34.9 % — ABNORMAL LOW (ref 39.0–52.0)
Hemoglobin: 11.1 g/dL — ABNORMAL LOW (ref 13.0–17.0)

## 2015-01-19 LAB — CREATININE, FLUID (PLEURAL, PERITONEAL, JP DRAINAGE): CREAT FL: 0.9 mg/dL

## 2015-01-19 MED ORDER — ONDANSETRON HCL 4 MG/2ML IJ SOLN
4.0000 mg | INTRAMUSCULAR | Status: DC | PRN
  Administered 2015-01-19 – 2015-01-20 (×2): 4 mg via INTRAVENOUS
  Filled 2015-01-19 (×3): qty 2

## 2015-01-19 MED ORDER — DIPHENHYDRAMINE HCL 25 MG PO CAPS
25.0000 mg | ORAL_CAPSULE | Freq: Once | ORAL | Status: AC
  Administered 2015-01-19: 25 mg via ORAL
  Filled 2015-01-19: qty 1

## 2015-01-19 NOTE — Progress Notes (Signed)
1 Day Post-Op  Subjective:  1 - Right Renal Mass - s/p robotic partial nephrectomy with intraoperative ultrasound and ICG dye injection 01/18/2015 for 3cm solid-cystic renal mass, path pending.   Today " Brett Clark " is without complaints. No emesis, pain controlled.   Objective: Vital signs in last 24 hours: Temp:  [97.8 F (36.6 C)-98.8 F (37.1 C)] 98.7 F (37.1 C) (06/16 0445) Pulse Rate:  [69-103] 69 (06/16 0445) Resp:  [10-19] 16 (06/16 0445) BP: (122-154)/(61-80) 128/76 mmHg (06/16 0445) SpO2:  [98 %-100 %] 99 % (06/16 0445) Weight:  [80.2 kg (176 lb 12.9 oz)] 80.2 kg (176 lb 12.9 oz) (06/15 1522)    Intake/Output from previous day: 06/15 0701 - 06/16 0700 In: 4916.3 [P.O.:360; I.V.:4556.3] Out: 1875 [Urine:1585; Drains:140; Blood:150] Intake/Output this shift:    General appearance: alert, cooperative, appears stated age and family at bedside Eyes: negative Nose: Nares normal. Septum midline. Mucosa normal. No drainage or sinus tenderness. Throat: lips, mucosa, and tongue normal; teeth and gums normal Neck: supple, symmetrical, trachea midline Back: symmetric, no curvature. ROM normal. No CVA tenderness. Resp: non-labored on room air Cardio: Nl rate GI: soft, non-tender; bowel sounds normal; no masses,  no organomegaly Extremities: extremities normal, atraumatic, no cyanosis or edema Pulses: 2+ and symmetric Lymph nodes: Cervical, supraclavicular, and axillary nodes normal. Incision/Wound: port sites c/d/i. JP with scant serous drainge. Foley with clear urine.   Lab Results:   Recent Labs  01/16/15 0950 01/18/15 1305 01/19/15 0500  WBC 7.0  --   --   HGB 13.8 12.3* 11.1*  HCT 42.2 38.5* 34.9*  PLT 386  --   --    BMET  Recent Labs  01/16/15 0950 01/19/15 0500  NA 141 139  K 4.4 3.7  CL 102 107  CO2 30 28  GLUCOSE 91 116*  BUN 11 11  CREATININE 0.84 1.03  CALCIUM 9.5 8.4*   PT/INR No results for input(s): LABPROT, INR in the last 72  hours. ABG No results for input(s): PHART, HCO3 in the last 72 hours.  Invalid input(s): PCO2, PO2  Studies/Results: No results found.  Anti-infectives: Anti-infectives    Start     Dose/Rate Route Frequency Ordered Stop   01/18/15 0646  ceFAZolin (ANCEF) IVPB 2 g/50 mL premix     2 g 100 mL/hr over 30 Minutes Intravenous 30 min pre-op 01/18/15 2202 01/18/15 1221      Assessment/Plan:  1 - Right Renal Mass - Doing well POD 1 s/p partial nephrecotmy. Serum Cr excellent, hgb approx stable. Ambulate, DC foley, Check JP Cr, Saline lock. Disccussed goals for dischage. Probable DC this afternoon v. Tomorrow.   [facility], Brett Clark 01/19/2015

## 2015-01-19 NOTE — Op Note (Signed)
[name], Clark NO.:  [id number]  MEDICAL RECORD NO.:  [id number]  LOCATION:  76                         FACILITY:  [facility]  PHYSICIAN:  Alexis Frock, MD     DATE OF BIRTH:  [birth year]  DATE OF PROCEDURE:  01/18/2015                               OPERATIVE REPORT   DIAGNOSIS:  Right renal mass.  PROCEDURE: 1. Robotic-assisted laparoscopic right partial nephrectomy. 2. Intraoperative ultrasound with interpretation. 3. Injection of indocyanine green dye for vascular flow and function.  ESTIMATED BLOOD LOSS:  150 mL.  COMPLICATIONS:  None.  SPECIMENS: 1. Fat around renal tumor negative for carcinoma on frozen section. 2. Right renal mass with associated fat for permanent pathology.  FINDINGS: 1. Two artery, 2 vein, right renovascular anatomy, also a parasitic     tumor vessel, arterial in nature. 2. Approximately 50% endophytic right renal mass by intraoperative     ultrasound. 3. Hypofluorescent right renal mass with indocyanine green dye     injection.  WARM ISCHEMIA TIME: 17 minutes  DRAINS: 1. Jackson-Pratt drain to bulb suction. 2. Foley catheter to straight drain.  INDICATION:  Mr. Brett Clark is a pleasant 59 year old gentleman who was found to have an enhancing right renal mass, clinically localized.  He did have a single celiac borderline lymph node that was away from the renal hilum in an unusual location for metastatic disease thus again making the mass localized.  Options were discussed with the patient including surveillance protocols versus ablative therapies versus surgical extirpation with and without minimally invasive assistance and with and without nephron sparing, and he wished to proceed with right robotic partial nephrectomy.  Notably, the patient does have a cardiac stent after recent MI.  He has been on antiplatelet therapy for greater than 6 months.  He does have cardiology clearance to hold this agent for several weeks  around the time of surgery.  Informed consent was obtained and placed in medical record.  PROCEDURE IN DETAIL:  The patient being Brett Clark was verified. Procedure being right robotic partial nephrectomy was confirmed. Procedure was carried out.  Time-out was performed.  Intravenous antibiotics were administered.  General endotracheal anesthesia was introduced.  Foley catheter was placed per urethra to straight drain. The patient was placed into a right side up full flank position employing 15 degrees of stable flexion, superior arm elevator, axillary roll, sequential compression devices, bottom leg bent, top leg straight. He was further fashioned on the operative table using beanbag and 3-inch tape over foam across his supraxiphoid abdomen and pelvis.  Sterile field was created by prepping and draping the patient's right flank using chlorhexidine gluconate.  Next, a high-flow low pressure pneumoperitoneum was obtained using Veress technique in the right hemi- abdomen having passed the aspiration and drop test.  Next, a 12-mm robotic camera port was placed in position approximately 4 fingerbreadths superior lateral to the umbilicus.  Laparoscopic examination of the peritoneal cavity revealed no significant adhesions, no visceral injury.  Additional ports were placed as follows; left subcostal 8-mm robotic port, left far lateral 8-mm robotic port approximately 4 fingerbreadths superior and medial to the anterior iliac spine, left inferior paramedian robotic port, 12  mm assistant port in the midline infraumbilical, this was an air seal port, 12 mm assistant port in the paramedian location approximately 1 fingerbreadth lateral to the midline superiorly, and a 5 mm liver retraction port just beneath the xiphoid.  The inferior liver edge was retracted superiorly using a self-locking laparoscopic grasper, which was anchored to the posterior peritoneum and placing the liver in gentle  superior traction.  Initial attention was directed at development of retroperitoneum, which was made lateral to the ascending colon from the area of the cecum towards the area of the hepatic flexure, and this was carefully mobilized medially. Lower pole of the kidney was identified, placed on gentle lateral traction.  Dissection proceeded medial to this.  Ureter was encountered and also placed on gentle lateral traction.  Dissection proceeded within this triangle towards the area of renal hilum.  The renal hilum was quite complex, it consisted of a dominant early branching artery immediately behind a dominant vein.  There was also a second set of renal vessel, lower pole, artery, and vein below this, and there was a separate parasitic vessel, arterial in nature coursing around the inferior pole of the kidney towards the area of the mass as expected. This was doubly clipped and ligated.  The dominant arterial branch was marked with vessel loop proximal to its branching as was the dominant vein and the inferior artery.  Attention was directed at identification of the mass.  The lateral and posterior aspects of the kidney were carefully defatted.  The mass was somewhat more endophytic than anticipated based on CT scan, and intraoperative ultrasound was performed.  Inttraoperative ultrasound revealed a right posterior mid renal mass approximately 3 to 4 cm in diameter.  This had both solid and cystic components, cystic components appeared to be the deep aspect.  Using  ultrasound, the margins for partial nephrectomy were carefully marked.  Notably the fat overlying the mass was very, very inflammatory in nature.  This was felt to likely be reactive to frozen section, this was set aside for frozen section, negative for carcinoma.  Decision was made to proceed with partial nephrectomy.  Next, 2 mL of indocyanine green dye was given intravenously.  Arterial and venous passes were visualized  with infrared fluorescence light.  Under near infrared fluorescence light, the mass in question was found to be hypofluorescent related to normal surrounding parenchyma.  Warm ischemia was achieved by clamping the superior dominant artery just proximal to its branching and the lower pole artery.  Next, very careful partial nephrectomy was performed using cold scissors keeping what appeared to be a rim of normal-appearing parenchyma with the mass in question.  This was performed first in the posterior plane then in the inferior plane and finally superiorly.  Also during this dissection, periodic near-infrared fluorescence imaging was performed to help verify correct plane of dissection.  The mass in question with fat overlying it was set aside for later retrieval.  Venography was performed first of the overlying small venous sinuses and collecting system using running 3 0 V-Loc, a single bolster was applied in the long axis and parenchymal apposition sutures of interrupted 0-Vicryl on a CT needle were performed x4, which yielded excellent parenchymal apposition.  The clamps were removed for total warm ischemia time of 17 minutes.  10 mL of Floseal was applied and then parenchymal sutures were further anchored with lapper ties.  All sponge and needle counts were correct.  Hemostasis appeared excellent.  The kidney was retroperitonealized by  lying the ascending colon from back over the anterior surface of the kidney. Liver retractors were taken down.  Specimen was placed into a smaller EndoCatch bag.  Robot was then undocked.  Closed suction drain was brought through laparoscopically the lateral most robotic port site near the peritoneal cavity, this was retrieved by extending the previous inferior most assistant port site for a total distance of approximately 3 cm removing the specimen and sending for permanent pathology.  The previous 12-mm robotic camera port and superior assistant port  were closed at the level of fascia using Carter-Thomason suture passer under laparoscopic vision.  The extraction port was closed at the level of fascia using figure-of-eight PDS x2 volar reapproximation of Scarpa's with Vicryl.  Incision sites were infiltrated with dilute lyophilized Marcaine and closed at the level of the skin using subcuticular Monocryl and Dermabond.  Procedure was then terminated. The patient tolerated the procedure well.  There were no immediate periprocedural complications.  The patient was taken to the postanesthesia care unit in stable condition.          ______________________________ Alexis Frock, MD     TM/MEDQ  D:  01/18/2015  T:  01/19/2015  Job:  [id number]

## 2015-01-20 LAB — BASIC METABOLIC PANEL
Anion gap: 9 (ref 5–15)
BUN: 10 mg/dL (ref 6–20)
CO2: 26 mmol/L (ref 22–32)
CREATININE: 1.05 mg/dL (ref 0.61–1.24)
Calcium: 8.5 mg/dL — ABNORMAL LOW (ref 8.9–10.3)
Chloride: 104 mmol/L (ref 101–111)
GFR calc Af Amer: >60 mL/min (ref >60)
GFR calc non Af Amer: >60 mL/min (ref >60)
Glucose, Bld: 104 mg/dL — ABNORMAL HIGH (ref 65–99)
Potassium: 3.6 mmol/L (ref 3.5–5.1)
Sodium: 139 mmol/L (ref 135–145)

## 2015-01-20 LAB — HEMOGLOBIN AND HEMATOCRIT, BLOOD
HCT: 34.5 % — ABNORMAL LOW (ref 39.0–52.0)
Hemoglobin: 11.1 g/dL — ABNORMAL LOW (ref 13.0–17.0)

## 2015-01-20 MED ORDER — SENNOSIDES-DOCUSATE SODIUM 8.6-50 MG PO TABS: 1.0000 | ORAL_TABLET | Freq: Two times a day (BID) | ORAL | Status: DC

## 2015-01-20 NOTE — Discharge Summary (Signed)
Physician Discharge Summary  Patient ID: Brett Clark MRN: [id number] DOB/AGE: 1956/01/07 59 y.o.  Admit date: 01/18/2015 Discharge date: 01/20/2015  Admission Diagnoses: Right Renal Mass  Discharge Diagnoses:  Active Problems:   Renal mass   Discharged Condition: good  Hospital Course:    1 - Right Renal Mass - s/p robotic partial nephrectomy with intraoperative ultrasound and ICG dye injection 01/18/2015 for 3cm solid-cystic renal mass, path pending. Drain removed POD 1 as output minimal and creatinine same as serum. By POD 2, the day of discharge, pt abmulatory, pain controlled on PO meds, tollerating reg diet, and felt to be adequate for discharge. He held is effient pre/post op under cardiology guidance with plan to hole x 2 weeks post surgery.   Consults: None  Significant Diagnostic Studies: labs: Hgb >10, JP Cr <1, pathology pending  Treatments: surgery: robotic right partial nephrectomy with intraoperative ultrasound and ICG dye injection 01/18/2015   Discharge Exam: Blood pressure 135/80, pulse 99, temperature 99.7 F (37.6 C), temperature source Oral, resp. rate 16, height 6' (1.829 m), weight 80.2 kg (176 lb 12.9 oz), SpO2 94 %. Head: Normocephalic, without obvious abnormality, atraumatic Nose: Nares normal. Septum midline. Mucosa normal. No drainage or sinus tenderness. Throat: lips, mucosa, and tongue normal; teeth and gums normal Neck: supple, symmetrical, trachea midline Back: symmetric, no curvature. ROM normal. No CVA tenderness. Resp: non-labored on room air, mild guarding for deep inspiration Cardio: mild tachycardia that is regular GI: soft, non-tender; bowel sounds normal; no masses,  no organomegaly Male genitalia: normal Extremities: extremities normal, atraumatic, no cyanosis or edema Pulses: 2+ and symmetric Lymph nodes: Cervical, supraclavicular, and axillary nodes normal. Neurologic: Grossly normal Incision/Wound: recent port sites / extraction  sites c/d/i. Prior JP site with scant serous drainage that is non-foul, non-bloody.  Disposition: 01-Home or Self Care     Medication List    STOP taking these medications        aspirin 81 MG chewable tablet     EFFIENT 10 MG Tabs tablet  Generic drug:  prasugrel     phenylephrine 10 MG Tabs tablet  Commonly known as:  SUDAFED PE     pseudoephedrine-acetaminophen 30-500 MG Tabs  Commonly known as:  TYLENOL SINUS      TAKE these medications        atorvastatin 80 MG tablet  Commonly known as:  LIPITOR  Take 1 tablet (80 mg total) by mouth daily at 6 PM.     fenofibrate 160 MG tablet  Take 1 tablet (160 mg total) by mouth daily.     HYDROcodone-acetaminophen 5-325 MG per tablet  Commonly known as:  NORCO  Take 1-2 tablets by mouth every 6 (six) hours as needed.     loratadine 10 MG tablet  Commonly known as:  CLARITIN  Take 10 mg by mouth every morning.     nitroGLYCERIN 0.4 MG SL tablet  Commonly known as:  NITROSTAT  Place 1 tablet (0.4 mg total) under the tongue every 5 (five) minutes x 3 doses as needed for chest pain.     pantoprazole 40 MG tablet  Commonly known as:  PROTONIX  Take 1 tablet (40 mg total) by mouth daily at 6 (six) AM.     senna-docusate 8.6-50 MG per tablet  Commonly known as:  Senokot-S  Take 1 tablet by mouth 2 (two) times daily. While taking pain meds to prevent constipation           Follow-up Information  Follow up with Alexis Frock, MD.   Specialty:  Urology   Why:  office will call you with date and time of appointment   Contact information:   [place] [address] [phone]       Signed: Alexis Frock 01/20/2015, 6:34 AM

## 2015-04-02 ENCOUNTER — Other Ambulatory Visit: Payer: Self-pay | Admitting: Interventional Cardiology

## 2015-04-04 NOTE — Telephone Encounter (Signed)
Should the patient still be taking this? Per his chart, it was discontinued in the hospital, but he has still been refilling it. Please advise. Thanks, MI

## 2015-04-05 NOTE — Telephone Encounter (Signed)
The patient should've had 5-7 days Effient prior to kidney resection. He should has started the medication back when safe after surgery (determine by urologist).  The answer is yes, he needs to remain on Effient.

## 2015-06-05 ENCOUNTER — Other Ambulatory Visit: Payer: Self-pay | Admitting: Interventional Cardiology

## 2015-06-12 ENCOUNTER — Other Ambulatory Visit: Payer: 59

## 2015-06-19 ENCOUNTER — Ambulatory Visit: Payer: 59 | Admitting: Interventional Cardiology

## 2015-07-04 ENCOUNTER — Other Ambulatory Visit: Payer: Self-pay | Admitting: Physician Assistant

## 2015-09-19 DIAGNOSIS — C649 Malignant neoplasm of unspecified kidney, except renal pelvis: Secondary | ICD-10-CM | POA: Insufficient documentation

## 2015-09-20 ENCOUNTER — Ambulatory Visit (INDEPENDENT_AMBULATORY_CARE_PROVIDER_SITE_OTHER): Payer: 59 | Admitting: Interventional Cardiology

## 2015-09-20 ENCOUNTER — Encounter: Payer: Self-pay | Admitting: Interventional Cardiology

## 2015-09-20 VITALS — BP 138/82 | HR 70 | Ht 73.0 in | Wt 184.4 lb

## 2015-09-20 DIAGNOSIS — I2510 Atherosclerotic heart disease of native coronary artery without angina pectoris: Secondary | ICD-10-CM | POA: Diagnosis not present

## 2015-09-20 DIAGNOSIS — E785 Hyperlipidemia, unspecified: Secondary | ICD-10-CM

## 2015-09-20 DIAGNOSIS — C641 Malignant neoplasm of right kidney, except renal pelvis: Secondary | ICD-10-CM

## 2015-09-20 DIAGNOSIS — I214 Non-ST elevation (NSTEMI) myocardial infarction: Secondary | ICD-10-CM | POA: Diagnosis not present

## 2015-09-20 DIAGNOSIS — I255 Ischemic cardiomyopathy: Secondary | ICD-10-CM

## 2015-09-20 DIAGNOSIS — N2889 Other specified disorders of kidney and ureter: Secondary | ICD-10-CM

## 2015-09-20 DIAGNOSIS — Z720 Tobacco use: Secondary | ICD-10-CM

## 2015-09-20 LAB — LIPID PANEL
CHOL/HDL RATIO: 4.1 ratio (ref <=5.0)
Cholesterol: 132 mg/dL (ref 125–200)
HDL: 32 mg/dL — AB (ref >=40)
LDL CALC: 82 mg/dL (ref <130)
Triglycerides: 91 mg/dL (ref <150)
VLDL: 18 mg/dL (ref <30)

## 2015-09-20 LAB — ALT: ALT: 17 U/L (ref 9–46)

## 2015-09-20 MED ORDER — ASPIRIN EC 81 MG PO TBEC: 81.0000 mg | DELAYED_RELEASE_TABLET | Freq: Every day | ORAL | Status: AC

## 2015-09-20 NOTE — Progress Notes (Signed)
Cardiology Office Note   Date:  09/20/2015   ID:  Jarvell, Pyon Sep 02, 1955, MRN [id number]  PCP:  Vena Austria, MD  Cardiologist:  Sinclair Grooms, MD   Chief Complaint  Patient presents with  . Coronary Artery Disease      History of Present Illness: Brett Clark is a 60 y.o. male who presents for CAD with prior anterior wall infarction requiring DES mid LAD, partial right nephrectomy for renal cell carcinoma (clear cell), hyperlipidemia, and chronic combined systolic and diastolic heart failure with last documented EF 45%.  Antwione is doing relatively well. No cardiac complications or symptoms since successful DES by Dr. Martinique in November 2015. He denies recurrent chest pain. We paused dual antiplatelet therapy for partial right nephrectomy in June 2016. No ischemic complications. Back at work full-time without limitations.  Past Medical History  Diagnosis Date  . CAD (coronary artery disease)     a. 06/2014 NSTEMI/PCI: LM nl, LAD 80 (2.5x18 Xience DES), LCX nl, RCA nl, EF 40-45%.  Marland Kitchen Dyslipidemia   . Abnormal abdominal CT scan     a. Abnl lesion on kidney/liver noted on CT @ OSH [facility]) - no further details available.  . Ischemic cardiomyopathy     a. 06/2014 EF 40-45% by LV gram;  b. 06/2014 Echo: EF 50-55%, mild LVH, distal anterior, apical, and inferoapical wall motion abnormalities, DD, mild MR.  . Tobacco abuse   . Right renal mass   . Myocardial infarction ([place]) nov 2015  . Headache     migraine and tension    Past Surgical History  Procedure Laterality Date  . Cardiac catheterization  06/23/2014    DES to mid LAD, single v disease  . Left heart catheterization with coronary angiogram N/A 06/23/2014    Procedure: LEFT HEART CATHETERIZATION WITH CORONARY ANGIOGRAM;  Surgeon: Peter M Martinique, MD;  Location: [facility] CATH LAB;  Service: Cardiovascular;  Laterality: N/A;  . Back surgery  years ago    neck  . Hydrocele surgery  15 years ago  .  Robotic assited partial nephrectomy Right 01/18/2015    Procedure: ROBOTIC ASSITED PARTIAL NEPHRECTOMY, INTRAOPERATIVE ULTRASOUND, AND INDOCYANINE GREEN DYE;  Surgeon: Alexis Frock, MD;  Location: [facility] ORS;  Service: Urology;  Laterality: Right;     Current Outpatient Prescriptions  Medication Sig Dispense Refill  . atorvastatin (LIPITOR) 80 MG tablet TAKE ONE TABLET BY MOUTH ONCE DAILY AT  6PM 30 tablet 3  . EFFIENT 10 MG TABS tablet TAKE ONE TABLET BY MOUTH ONCE DAILY 30 tablet 2  . fenofibrate 160 MG tablet TAKE ONE TABLET BY MOUTH ONCE DAILY 30 tablet 3  . HYDROcodone-acetaminophen (NORCO/VICODIN) 5-325 MG tablet Take 1-2 tablets by mouth every 6 (six) hours as needed for moderate pain.    . nitroGLYCERIN (NITROSTAT) 0.4 MG SL tablet Place 1 tablet (0.4 mg total) under the tongue every 5 (five) minutes x 3 doses as needed for chest pain. 25 tablet 0   No current facility-administered medications for this visit.    Allergies:   Codeine    Social History:  The patient  reports that he has quit smoking. His smoking use included Cigarettes. He has a 20 pack-year smoking history. He has never used smokeless tobacco. He reports that he drinks alcohol.   Family History:  The patient's family history includes CAD in his father; COPD in his father.    ROS:  Please see the history of present illness.   Otherwise,  review of systems are positive for having difficulty with lower back discomfort and pain radiating into the left leg. All 7 difficulty with sleep. The sleep problem is with falling to sleep and also with awakening and being unable to return to sleep..   All other systems are reviewed and negative.    PHYSICAL EXAM: VS:  BP 138/82 mmHg  Pulse 70  Ht 6\' 1"  (1.854 m)  Wt 184 lb 6.4 oz (83.643 kg)  BMI 24.33 kg/m2 , BMI Body mass index is 24.33 kg/(m^2). GEN: Well nourished, well developed, in no acute distress HEENT: normal Neck: no JVD, carotid bruits, or masses Cardiac: RRR.   There is no murmur, rub, or gallop. There is no edema. Respiratory:  clear to auscultation bilaterally, normal work of breathing. GI: soft, nontender, nondistended, + BS MS: no deformity or atrophy Skin: warm and dry, no rash Neuro:  Strength and sensation are intact Psych: euthymic mood, full affect   EKG:  EKG is ordered today. The ekg reveals normal sinus rhythm, incomplete right bundle, small inferior Q waves, and right would axis.   Recent Labs: 09/21/2014: ALT 18 01/16/2015: Platelets 386 01/20/2015: BUN 10; Creatinine, Ser 1.05; Hemoglobin 11.1*; Potassium 3.6; Sodium 139    Lipid Panel    Component Value Date/Time   CHOL 115 09/21/2014 0736   TRIG 119.0 09/21/2014 0736   HDL 27.50* 09/21/2014 0736   CHOLHDL 4 09/21/2014 0736   VLDL 23.8 09/21/2014 0736   LDLCALC 64 09/21/2014 0736      Wt Readings from Last 3 Encounters:  09/20/15 184 lb 6.4 oz (83.643 kg)  01/18/15 176 lb 12.9 oz (80.2 kg)  01/16/15 177 lb (80.287 kg)      Other studies Reviewed: Additional studies/ records that were reviewed today include: Reviewed the urological surgery notes.. The findings include partial left nephrectomy. Clear cell cancer was found. Dual antiplatelet therapy was paused and did not result in ischemic cardiac events..    ASSESSMENT AND PLAN:  1. Coronary artery disease involving native coronary artery of native heart without angina pectoris He is doing well with second generation drug-eluting stent in the mid LAD. We paused DAPT therapy during the surgical procedure for RCC.  2. Hyperlipidemia On therapy but no recent evaluation  3. Non-STEMI (non-ST elevated myocardial infarction) (HCC) LVEF is near normal. No recurrent symptoms since LAD stent  4. Ischemic cardiomyopathy Last EF documented to be 40-45%  5. Tobacco abuse Discontinued  6. Renal cell carcinoma, right (HCC) Partial right nephrectomy robotically with good outcome and clear  margins  7.Insomnia Melatonin recommended    Current medicines are reviewed at length with the patient today.  The patient has the following concerns regarding medicines: Whether he needs to continue Effient..  The following changes/actions have been instituted:    Discontinue Effient.  Lipid panel and liver panel.  Melatonin for insomnia  Smoking cessation was stressed but not committed to by the patient.  Labs/ tests ordered today include:  No orders of the defined types were placed in this encounter.     Disposition:   FU with HS in 1 year  Signed, Sinclair Grooms, MD  09/20/2015 8:41 AM    [facility] [place], [place],   [address] Phone: [phone]; Fax: [phone]

## 2015-09-20 NOTE — Patient Instructions (Signed)
Medication Instructions:  Your physician has recommended you make the following change in your medication:  STOP Effient   Labwork: Lipid and Alt today   Testing/Procedures: None ordered  Follow-Up: Your physician wants you to follow-up in: 1 year with Dr.Smith. You will receive a reminder letter in the mail two months in advance. If you don't receive a letter, please call our office to schedule the follow-up appointment.  Any Other Special Instructions Will Be Listed Below (If Applicable). Ok to take Melatonin over the counter as needed for sleep     If you need a refill on your cardiac medications before your next appointment, please call your pharmacy.

## 2015-09-21 ENCOUNTER — Other Ambulatory Visit: Payer: Self-pay

## 2015-09-21 DIAGNOSIS — E785 Hyperlipidemia, unspecified: Secondary | ICD-10-CM

## 2015-11-23 ENCOUNTER — Other Ambulatory Visit: Payer: Self-pay | Admitting: Interventional Cardiology

## 2015-12-11 ENCOUNTER — Other Ambulatory Visit: Payer: Self-pay | Admitting: Urology

## 2015-12-11 ENCOUNTER — Ambulatory Visit (HOSPITAL_COMMUNITY)
Admission: RE | Admit: 2015-12-11 | Discharge: 2015-12-11 | Disposition: A | Discharge status: Home / Self Care | Payer: 59 | Source: Ambulatory Visit | Attending: Urology | Admitting: Urology

## 2015-12-11 DIAGNOSIS — C641 Malignant neoplasm of right kidney, except renal pelvis: Secondary | ICD-10-CM

## 2016-01-09 ENCOUNTER — Other Ambulatory Visit: Payer: Self-pay | Admitting: Interventional Cardiology

## 2016-11-24 NOTE — Progress Notes (Signed)
Cardiology Office Note    Date:  11/25/2016   ID:  Sherod, Cisse May 05, 1956, MRN [id number]  PCP:  Vena Austria, MD  Cardiologist: Sinclair Grooms, MD   Chief Complaint  Patient presents with  . Coronary Artery Disease    History of Present Illness:  Brett Clark is a 61 y.o. male who presents for CAD with prior anterior wall infarction requiring DES mid LAD 06/2014, partial right nephrectomy for renal cell carcinoma (clear cell), hyperlipidemia, and chronic combined systolic and diastolic heart failure with last documented EF 50-55%.  Difficulty with endurance. No chest discomfort. No episodes of syncope or palpitations. No nitroglycerin use. We decided not to refill nitroglycerin. We encouraged aerobic activity.  Past Medical History:  Diagnosis Date  . Abnormal abdominal CT scan    a. Abnl lesion on kidney/liver noted on CT @ OSH [facility]) - no further details available.  Marland Kitchen CAD (coronary artery disease)    a. 06/2014 NSTEMI/PCI: LM nl, LAD 80 (2.5x18 Xience DES), LCX nl, RCA nl, EF 40-45%.  Marland Kitchen Dyslipidemia   . Headache    migraine and tension  . Ischemic cardiomyopathy    a. 06/2014 EF 40-45% by LV gram;  b. 06/2014 Echo: EF 50-55%, mild LVH, distal anterior, apical, and inferoapical wall motion abnormalities, DD, mild MR.  . Myocardial infarction ([place]) nov 2015  . Right renal mass   . Tobacco abuse     Past Surgical History:  Procedure Laterality Date  . BACK SURGERY  years ago   neck  . CARDIAC CATHETERIZATION  06/23/2014   DES to mid LAD, single v disease  . hydrocele surgery  15 years ago  . LEFT HEART CATHETERIZATION WITH CORONARY ANGIOGRAM N/A 06/23/2014   Procedure: LEFT HEART CATHETERIZATION WITH CORONARY ANGIOGRAM;  Surgeon: Peter M Martinique, MD;  Location: [facility] CATH LAB;  Service: Cardiovascular;  Laterality: N/A;  . ROBOTIC ASSITED PARTIAL NEPHRECTOMY Right 01/18/2015   Procedure: ROBOTIC ASSITED PARTIAL NEPHRECTOMY, INTRAOPERATIVE  ULTRASOUND, AND INDOCYANINE GREEN DYE;  Surgeon: Alexis Frock, MD;  Location: [facility];  Service: Urology;  Laterality: Right;    Current Medications: Outpatient Medications Prior to Visit  Medication Sig Dispense Refill  . aspirin EC 81 MG tablet Take 1 tablet (81 mg total) by mouth daily.    Marland Kitchen atorvastatin (LIPITOR) 80 MG tablet TAKE ONE TABLET BY MOUTH ONCE DAILY AT  6PM 30 tablet 7  . fenofibrate 160 MG tablet TAKE ONE TABLET BY MOUTH ONCE DAILY 90 tablet 2  . nitroGLYCERIN (NITROSTAT) 0.4 MG SL tablet Place 1 tablet (0.4 mg total) under the tongue every 5 (five) minutes x 3 doses as needed for chest pain. 25 tablet 0  . HYDROcodone-acetaminophen (NORCO/VICODIN) 5-325 MG tablet Take 1-2 tablets by mouth every 6 (six) hours as needed for moderate pain.     No facility-administered medications prior to visit.      Allergies:   Codeine   Social History   Social History  . Marital status: Married    Spouse name: N/A  . Number of children: N/A  . Years of education: N/A   Social History Main Topics  . Smoking status: Former Smoker    Packs/day: 1.00    Years: 20.00    Types: Cigarettes  . Smokeless tobacco: Never Used     Comment: quit smoking years ago  . Alcohol use Yes     Comment: occasional  . Drug use: Unknown  . Sexual activity: Not Asked  Other Topics Concern  . None   Social History Narrative  . None     Family History:  The patient's family history includes CAD in his father; COPD in his father.   ROS:   Please see the history of present illness.    Insomnia, finding it difficult to fall off to sleep. Left knee discomfort. Difficulty stooping and arising from a supine position. All other systems reviewed and are negative.   PHYSICAL EXAM:   VS:  BP 132/78   Pulse 69   Ht 6\' 1"  (1.854 m)   Wt 187 lb (84.8 kg)   BMI 24.67 kg/m    GEN: Well nourished, well developed, in no acute distress  HEENT: normal  Neck: no JVD, carotid bruits, or  masses Cardiac: RRR; no murmurs, rubs, or gallops,no edema  Respiratory:  clear to auscultation bilaterally, normal work of breathing GI: soft, nontender, nondistended, + BS MS: no deformity or atrophy  Skin: warm and dry, no rash Neuro:  Alert and Oriented x 3, Strength and sensation are intact Psych: euthymic mood, full affect  Wt Readings from Last 3 Encounters:  11/25/16 187 lb (84.8 kg)  09/20/15 184 lb 6.4 oz (83.6 kg)  01/18/15 176 lb 12.9 oz (80.2 kg)      Studies/Labs Reviewed:   EKG:  EKG  Electrocardiogram reveals incomplete right bundle, sinus rhythm, left atrial abnormality, vertical axis, inferolateral Q waves. Poor R-wave progression is noted. No changes noted when compared to prior.  Recent Labs: No results found for requested labs within last 8760 hours.   Lipid Panel    Component Value Date/Time   CHOL 132 09/20/2015 0906   TRIG 91 09/20/2015 0906   HDL 32 (L) 09/20/2015 0906   CHOLHDL 4.1 09/20/2015 0906   VLDL 18 09/20/2015 0906   LDLCALC 82 09/20/2015 0906    Additional studies/ records that were reviewed today include:  ECHOCARDIOGRAM 06/23/14: Study Conclusions  - Left ventricle: The cavity size was normal. Wall thickness was increased in a pattern of mild LVH. Systolic function was normal. The estimated ejection fraction was in the range of 50% to 55%. There is distal anterior, apical and inferoapical hypokinesis, suggesting LA territory ischemia or infarct. Doppler parameters are consistent with abnormal left ventricular relaxation (grade 1 diastolic dysfunction). The E/e&' ratio is between 8-15, suggesting indeterminate LV filling pressure. - Mitral valve: Mildly thickened leaflets . There was mild regurgitation. - Left atrium: The atrium was normal in size.  Impressions:  - LVEF 50-55%, distal anterior, apical and inferoapical wall motion abnormality, consistent with LAD territory ischemia/infarct, diastolic  dysfunction, likely elevated LV filling pressures, mild MR.   ASSESSMENT:    1. Coronary artery disease involving native coronary artery of native heart without angina pectoris   2. Dyslipidemia   3. Tobacco abuse   4. Renal cell carcinoma of right kidney (HCC)      PLAN:  In order of problems listed above:  1. Asymptomatic with reference to angina/anginal equivalent symptoms. Still physically active. No functional testing required. 2. Last LDL slightly over a year ago was 82. Repeat will be done fasting. He is on high intensity statin therapy. 3. Discontinued since his myocardial infarction. 4. Not addressed.  Maintain an active lifestyle. Fasting liver and lipid panel. Asked that he discuss insomnia with his primary physician, Dr. Maury Dus. Clinical follow-up in one year.    Medication Adjustments/Labs and Tests Ordered: Current medicines are reviewed at length with the patient today.  Concerns  regarding medicines are outlined above.  Medication changes, Labs and Tests ordered today are listed in the Patient Instructions below. There are no Patient Instructions on file for this visit.   Signed, Sinclair Grooms, MD  11/25/2016 8:17 AM    [facility] [place], [place], [place]  [address] Phone: [phone]; Fax: [phone]

## 2016-11-25 ENCOUNTER — Other Ambulatory Visit: Payer: 59

## 2016-11-25 ENCOUNTER — Ambulatory Visit (INDEPENDENT_AMBULATORY_CARE_PROVIDER_SITE_OTHER): Payer: 59 | Admitting: Interventional Cardiology

## 2016-11-25 ENCOUNTER — Encounter: Payer: Self-pay | Admitting: Interventional Cardiology

## 2016-11-25 VITALS — BP 132/78 | HR 69 | Ht 73.0 in | Wt 187.0 lb

## 2016-11-25 DIAGNOSIS — Z720 Tobacco use: Secondary | ICD-10-CM

## 2016-11-25 DIAGNOSIS — C641 Malignant neoplasm of right kidney, except renal pelvis: Secondary | ICD-10-CM | POA: Diagnosis not present

## 2016-11-25 DIAGNOSIS — I2510 Atherosclerotic heart disease of native coronary artery without angina pectoris: Secondary | ICD-10-CM | POA: Diagnosis not present

## 2016-11-25 DIAGNOSIS — E785 Hyperlipidemia, unspecified: Secondary | ICD-10-CM | POA: Diagnosis not present

## 2016-11-25 MED ORDER — NITROGLYCERIN 0.4 MG SL SUBL: 0.4000 mg | SUBLINGUAL_TABLET | SUBLINGUAL | 3 refills | Status: DC | PRN

## 2016-11-25 NOTE — Patient Instructions (Signed)
Medication Instructions:  None  Labwork: Your physician recommends that you return for lab work when you are fasting (Liver and lipid)   Testing/Procedures: None  Follow-Up: Your physician wants you to follow-up in: 1 year with Dr. Tamala Julian.  You will receive a reminder letter in the mail two months in advance. If you don't receive a letter, please call our office to schedule the follow-up appointment.   Any Other Special Instructions Will Be Listed Below (If Applicable).     If you need a refill on your cardiac medications before your next appointment, please call your pharmacy.

## 2016-11-26 ENCOUNTER — Other Ambulatory Visit: Payer: 59

## 2016-11-28 ENCOUNTER — Other Ambulatory Visit: Payer: Self-pay | Admitting: Interventional Cardiology

## 2017-09-09 ENCOUNTER — Telehealth: Payer: Self-pay | Admitting: Interventional Cardiology

## 2017-09-09 NOTE — Telephone Encounter (Signed)
Referral sent to scheduling. 

## 2017-10-01 ENCOUNTER — Other Ambulatory Visit: Payer: Self-pay | Admitting: Dermatology

## 2017-10-01 DIAGNOSIS — C4491 Basal cell carcinoma of skin, unspecified: Secondary | ICD-10-CM

## 2017-10-01 HISTORY — DX: Basal cell carcinoma of skin, unspecified: C44.91

## 2017-10-08 ENCOUNTER — Encounter: Payer: Self-pay | Admitting: Interventional Cardiology

## 2017-10-13 NOTE — Progress Notes (Addendum)
Cardiology Office Note    Date:  10/14/2017   ID:  Clark, Brett Jan 04, 1956, MRN [id number]  PCP:  Maury Dus, MD  Cardiologist: Sinclair Grooms, MD   Chief Complaint  Patient presents with  . Coronary Artery Disease    History of Present Illness:  Brett Clark is a 62 y.o. male  who presents for CAD with prior anterior wall infarction requiring DES mid LAD 06/2014, partial right nephrectomy for renal cell carcinoma (clear cell), hyperlipidemia, and chronic combined systolic and diastolic heart failure with last documented EF 50-55%.   He is a Dealer.  When he bends and gets in certain positions doing his job he gets pain in the chest and in the left axilla and into his neck.  He stops and straightens out and gradually area this is totally different than the pain associated with his cardiac event.  He also does not experience discomfort with walking, climbing stairs, lifting.  He has not used nitroglycerin.   Past Medical History:  Diagnosis Date  . Abnormal abdominal CT scan    a. Abnl lesion on kidney/liver noted on CT @ OSH [facility]) - no further details available.  Brett Clark CAD (coronary artery disease)    a. 06/2014 NSTEMI/PCI: LM nl, LAD 80 (2.5x18 Xience DES), LCX nl, RCA nl, EF 40-45%.  Brett Clark Dyslipidemia   . Headache    migraine and tension  . Ischemic cardiomyopathy    a. 06/2014 EF 40-45% by LV gram;  b. 06/2014 Echo: EF 50-55%, mild LVH, distal anterior, apical, and inferoapical wall motion abnormalities, DD, mild MR.  . Myocardial infarction ([place]) nov 2015  . Right renal mass   . Tobacco abuse     Past Surgical History:  Procedure Laterality Date  . BACK SURGERY  years ago   neck  . CARDIAC CATHETERIZATION  06/23/2014   DES to mid LAD, single v disease  . hydrocele surgery  15 years ago  . LEFT HEART CATHETERIZATION WITH CORONARY ANGIOGRAM N/A 06/23/2014   Procedure: LEFT HEART CATHETERIZATION WITH CORONARY ANGIOGRAM;  Surgeon: Brett M Martinique, MD;   Location: [facility] CATH LAB;  Service: Cardiovascular;  Laterality: N/A;  . ROBOTIC ASSITED PARTIAL NEPHRECTOMY Right 01/18/2015   Procedure: ROBOTIC ASSITED PARTIAL NEPHRECTOMY, INTRAOPERATIVE ULTRASOUND, AND INDOCYANINE GREEN DYE;  Surgeon: Brett Frock, MD;  Location: [facility];  Service: Urology;  Laterality: Right;    Current Medications: Outpatient Medications Prior to Visit  Medication Sig Dispense Refill  . aspirin EC 81 MG tablet Take 1 tablet (81 mg total) by mouth daily.    Brett Clark atorvastatin (LIPITOR) 80 MG tablet TAKE ONE TABLET BY MOUTH ONCE DAILY AT 6PM 90 tablet 3  . fenofibrate 160 MG tablet TAKE ONE TABLET BY MOUTH ONCE DAILY 90 tablet 3  . nitroGLYCERIN (NITROSTAT) 0.4 MG SL tablet Place 1 tablet (0.4 mg total) under the tongue every 5 (five) minutes x 3 doses as needed for chest pain. (Patient not taking: Reported on 10/14/2017) 25 tablet 3   No facility-administered medications prior to visit.      Allergies:   Codeine   Social History   Socioeconomic History  . Marital status: Married    Spouse name: None  . Number of children: None  . Years of education: None  . Highest education level: None  Social Needs  . Financial resource strain: None  . Food insecurity - worry: None  . Food insecurity - inability: None  . Transportation needs -  medical: None  . Transportation needs - non-medical: None  Occupational History  . None  Tobacco Use  . Smoking status: Former Smoker    Packs/day: 1.00    Years: 20.00    Pack years: 20.00    Types: Cigarettes  . Smokeless tobacco: Never Used  . Tobacco comment: quit smoking years ago  Substance and Sexual Activity  . Alcohol use: Yes    Comment: occasional  . Drug use: No  . Sexual activity: None  Other Topics Concern  . None  Social History Narrative  . None     Family History:  The patient's family history includes CAD in his father; COPD in his father.   ROS:   Please see the history of present illness.    Has  history of cervical disc disease.  Has been having severe headaches.  Denies edema.  Uses Aleve and Advil every day for neck and back pain. All other systems reviewed and are negative.   PHYSICAL EXAM:   VS:  BP 132/64   Pulse 71   Ht 6\' 1"  (1.854 m)   Wt 192 lb 3.2 oz (87.2 kg)   BMI 25.36 kg/m    GEN: Well nourished, well developed, in no acute distress  HEENT: normal  Neck: no JVD, carotid bruits, or masses Cardiac: RRR; no murmurs, rubs, or gallops,no edema  Respiratory:  clear to auscultation bilaterally, normal work of breathing GI: soft, nontender, nondistended, + BS MS: no deformity or atrophy  Skin: warm and dry, no rash Neuro:  Alert and Oriented x 3, Strength and sensation are intact Psych: euthymic mood, full affect  Wt Readings from Last 3 Encounters:  10/14/17 192 lb 3.2 oz (87.2 kg)  11/25/16 187 lb (84.8 kg)  09/20/15 184 lb 6.4 oz (83.6 kg)      Studies/Labs Reviewed:   EKG:  EKG normal sinus rhythm, right axis deviation, incomplete right bundle branch block, fascicular block with left posterior hemiblock bleed right bundle branch block.  There is no change when compared to the prior tracing done in 11/25/2016  Recent Labs: No results found for requested labs within last 8760 hours.   Lipid Panel    Component Value Date/Time   CHOL 132 09/20/2015 0906   TRIG 91 09/20/2015 0906   HDL 32 (L) 09/20/2015 0906   CHOLHDL 4.1 09/20/2015 0906   VLDL 18 09/20/2015 0906   LDLCALC 82 09/20/2015 0906    Additional studies/ records that were reviewed today include:  No new data    ASSESSMENT:    1. Coronary artery disease involving native coronary artery of native heart without angina pectoris   2. Tobacco abuse   3. Ischemic cardiomyopathy   4. Dyslipidemia   5. Essential hypertension      PLAN:  In order of problems listed above:  1. He is doing well without symptoms suggestive of CAD.  Encourage physical activity.  Discussed targets for blood  pressure control which is quite elevated today.  Our goal is 130/80 mmHg.  LDL target is 70 or less. 2. Has discontinued smoking. 3. LVEF was in the 45-50% range when last checked.  No evidence of heart failure.  Because of problem #6 below, lisinopril 10 mg/day is being added along with HCTZ 12.5 mg. 4. LDL target less than 70.  Currently on fibroid and statin therapy which increases the risk of myopathy.  This is because of hypertriglyceridemia.  We should likely consider discontinuing fenofibrate and starting Zetia. 5. Start  lisinopril HCT 12.5 mg once per day.  Basic metabolic panel in 2 weeks.  Blood pressure clinic in 1 month.  Blood pressure target 130/80 mmHg.  Reduce salt in diet.  Cut back on nonsteroidal anti-inflammatory agents.    Medication Adjustments/Labs and Tests Ordered: Current medicines are reviewed at length with the patient today.  Concerns regarding medicines are outlined above.  Medication changes, Labs and Tests ordered today are listed in the Patient Instructions below. Patient Instructions  Medication Instructions:  1) START Lisinopril/HCTZ 10/12.5mg  once daily 2) Try to cut back on the amount of Aleve and Advil you are using.  Labwork: Your physician recommends that you return for lab work in: 2 weeks (BMET)   Testing/Procedures: None  Follow-Up: Your physician recommends that you schedule a follow-up appointment in: 1 month with the [facility].    Your physician wants you to follow-up in: 1 year with Dr. Tamala Julian.  You will receive a reminder letter in the mail two months in advance. If you don't receive a letter, please call our office to schedule the follow-up appointment.   Any Other Special Instructions Will Be Listed Below (If Applicable).     If you need a refill on your cardiac medications before your next appointment, please call your pharmacy.      Signed, Sinclair Grooms, MD  10/14/2017 8:40 AM    [facility] [place], [place], [place]  [address] Phone: [phone]; Fax: [phone]

## 2017-10-14 ENCOUNTER — Encounter: Payer: Self-pay | Admitting: Interventional Cardiology

## 2017-10-14 ENCOUNTER — Ambulatory Visit: Payer: 59 | Admitting: Interventional Cardiology

## 2017-10-14 VITALS — BP 132/64 | HR 71 | Ht 73.0 in | Wt 192.2 lb

## 2017-10-14 DIAGNOSIS — I10 Essential (primary) hypertension: Secondary | ICD-10-CM | POA: Diagnosis not present

## 2017-10-14 DIAGNOSIS — Z720 Tobacco use: Secondary | ICD-10-CM

## 2017-10-14 DIAGNOSIS — I255 Ischemic cardiomyopathy: Secondary | ICD-10-CM

## 2017-10-14 DIAGNOSIS — E785 Hyperlipidemia, unspecified: Secondary | ICD-10-CM | POA: Diagnosis not present

## 2017-10-14 DIAGNOSIS — I2510 Atherosclerotic heart disease of native coronary artery without angina pectoris: Secondary | ICD-10-CM | POA: Diagnosis not present

## 2017-10-14 MED ORDER — LISINOPRIL-HYDROCHLOROTHIAZIDE 10-12.5 MG PO TABS: 1.0000 | ORAL_TABLET | Freq: Every day | ORAL | 3 refills | Status: DC

## 2017-10-14 NOTE — Patient Instructions (Addendum)
Medication Instructions:  1) START Lisinopril/HCTZ 10/12.5mg  once daily 2) Try to cut back on the amount of Aleve and Advil you are using.  Labwork: Your physician recommends that you return for lab work in: 2 weeks (BMET)   Testing/Procedures: None  Follow-Up: Your physician recommends that you schedule a follow-up appointment in: 1 month with the [facility].    Your physician wants you to follow-up in: 1 year with Dr. Tamala Julian.  You will receive a reminder letter in the mail two months in advance. If you don't receive a letter, please call our office to schedule the follow-up appointment.   Any Other Special Instructions Will Be Listed Below (If Applicable).     If you need a refill on your cardiac medications before your next appointment, please call your pharmacy.

## 2017-10-14 NOTE — Addendum Note (Signed)
Addended by: Marlis Edelson C on: 10/14/2017 03:50 PM   Modules accepted: Orders

## 2017-10-30 ENCOUNTER — Other Ambulatory Visit: Payer: Self-pay | Admitting: Dermatology

## 2017-11-04 ENCOUNTER — Other Ambulatory Visit: Payer: 59

## 2017-11-04 DIAGNOSIS — I10 Essential (primary) hypertension: Secondary | ICD-10-CM

## 2017-11-04 LAB — BASIC METABOLIC PANEL
BUN/Creatinine Ratio: 10 (ref 10–24)
BUN: 18 mg/dL (ref 8–27)
CALCIUM: 10.5 mg/dL — AB (ref 8.6–10.2)
CHLORIDE: 99 mmol/L (ref 96–106)
CO2: 28 mmol/L (ref 20–29)
CREATININE: 1.77 mg/dL — AB (ref 0.76–1.27)
GFR calc Af Amer: 47 mL/min/{1.73_m2} — ABNORMAL LOW (ref >59)
GFR calc non Af Amer: 40 mL/min/{1.73_m2} — ABNORMAL LOW (ref >59)
GLUCOSE: 91 mg/dL (ref 65–99)
Potassium: 4.7 mmol/L (ref 3.5–5.2)
Sodium: 140 mmol/L (ref 134–144)

## 2017-11-06 ENCOUNTER — Telehealth: Payer: Self-pay | Admitting: *Deleted

## 2017-11-06 DIAGNOSIS — R7989 Other specified abnormal findings of blood chemistry: Secondary | ICD-10-CM

## 2017-11-06 NOTE — Telephone Encounter (Signed)
Informed pt of results. Pt verbalized understanding. Scheduled labs for 4/16, same day as HTN clinic appt.

## 2017-11-06 NOTE — Telephone Encounter (Signed)
-----   Message from Belva Crome, MD sent at 11/06/2017  9:28 AM EDT ----- Let the patient know there is mild kidney impairment.  He needs to repeat a basic metabolic panel in 2 weeks to ensure stable creatinine.  No change in therapy. A copy will be sent to Maury Dus, MD

## 2017-11-17 NOTE — Progress Notes (Addendum)
Patient ID: Brett Clark                 DOB: 16-Nov-1955                      MRN: [id number]     HPI: Brett Clark is a 62 y.o. male patient of Dr. Tamala Julian who presents today for hypertension evaluation. PMH significant for CAD with prior anterior wall infarction requiring DES mid LAD11/2015, partial right nephrectomy for renal cell carcinoma (clear cell), hyperlipidemia, and chronic combined systolic and diastolic heart failure with last documented EF50-55%. At his most recent OV he was started on lisinopril/HCTZ.   He presents today in good spirits. He has headaches constantly for which he is being seen on May 6th. He denies chest pain, SOB, and dizziness. He states he has done well on lisinopril/HCTZ.   Current HTN meds:  Lisinopril/HCTZ 10/12.5mg  daily  Previously tried: none   BP goal: <130/80  Family History: CAD in his father; COPD in his father.   Social History: former smoker, occasional alcohol  Diet: He eats mostly from home. He adds salt some. He does admit he could be better. He admits he drinks more soda than he should. He does drink water some. He does eat vegetables. He does not have a big sweet tooth.   Exercise: He is very active at work. 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   Home BP readings: He does not have a blood pressure cuff.   Wt Readings from Last 3 Encounters:  10/14/17 192 lb 3.2 oz (87.2 kg)  11/25/16 187 lb (84.8 kg)  09/20/15 184 lb 6.4 oz (83.6 kg)   BP Readings from Last 3 Encounters:  11/18/17 124/82  10/14/17 132/64  11/25/16 132/78   Pulse Readings from Last 3 Encounters:  11/18/17 79  10/14/17 71  11/25/16 69    Renal function: CrCl cannot be calculated (Unknown ideal weight.).  Past Medical History:  Diagnosis Date  . Abnormal abdominal CT scan    a. Abnl lesion on kidney/liver noted on CT @ [facility]) - no further details available.  Marland Kitchen CAD (coronary artery disease)    a. 06/2014 NSTEMI/PCI: LM nl, LAD 80 (2.5x18  Xience DES), LCX nl, RCA nl, EF 40-45%.  Marland Kitchen Dyslipidemia   . Headache    migraine and tension  . Ischemic cardiomyopathy    a. 06/2014 EF 40-45% by LV gram;  b. 06/2014 Echo: EF 50-55%, mild LVH, distal anterior, apical, and inferoapical wall motion abnormalities, DD, mild MR.  . Myocardial infarction ([place]) nov 2015  . Right renal mass   . Tobacco abuse     Current Outpatient Medications on File Prior to Visit  Medication Sig Dispense Refill  . aspirin EC 81 MG tablet Take 1 tablet (81 mg total) by mouth daily.    Marland Kitchen atorvastatin (LIPITOR) 80 MG tablet TAKE ONE TABLET BY MOUTH ONCE DAILY AT 6PM 90 tablet 3  . fenofibrate 160 MG tablet TAKE ONE TABLET BY MOUTH ONCE DAILY 90 tablet 3  . lisinopril-hydrochlorothiazide (PRINZIDE,ZESTORETIC) 10-12.5 MG tablet Take 1 tablet by mouth daily. 90 tablet 3  . nitroGLYCERIN (NITROSTAT) 0.4 MG SL tablet Place 1 tablet (0.4 mg total) under the tongue every 5 (five) minutes x 3 doses as needed for chest pain. (Patient not taking: Reported on 10/14/2017) 25 tablet 3   No current facility-administered medications on file prior to visit.     Allergies  Allergen Reactions  . Codeine     headaches    Blood pressure 124/82, pulse 79.   Assessment/Plan: Hypertension: BMET today. BP today is borderline at goal. Will continue medications as prescribed. Advised he monitor his pressures occasionally at local pharmacies and call with increase. Follow up with Dr. Tamala Julian as recommended and HTN clinic as needed.   Pt would like results of BMET to be sent to Dr. Tammi Klippel (his urologist) - [phone] phone.   Thank you, Lelan Pons. Patterson Hammersmith, [place]  11/18/2017 7:55 AM   ADDENDUM: BMET stable. Results sent per request.

## 2017-11-18 ENCOUNTER — Encounter: Payer: Self-pay | Admitting: Pharmacist

## 2017-11-18 ENCOUNTER — Ambulatory Visit (INDEPENDENT_AMBULATORY_CARE_PROVIDER_SITE_OTHER): Payer: 59 | Admitting: Pharmacist

## 2017-11-18 ENCOUNTER — Other Ambulatory Visit: Payer: 59 | Admitting: *Deleted

## 2017-11-18 DIAGNOSIS — I10 Essential (primary) hypertension: Secondary | ICD-10-CM | POA: Diagnosis not present

## 2017-11-18 DIAGNOSIS — R7989 Other specified abnormal findings of blood chemistry: Secondary | ICD-10-CM

## 2017-11-18 LAB — BASIC METABOLIC PANEL
BUN / CREAT RATIO: 11 (ref 10–24)
BUN: 19 mg/dL (ref 8–27)
CHLORIDE: 99 mmol/L (ref 96–106)
CO2: 26 mmol/L (ref 20–29)
CREATININE: 1.66 mg/dL — AB (ref 0.76–1.27)
Calcium: 9.8 mg/dL (ref 8.6–10.2)
GFR calc Af Amer: 50 mL/min/{1.73_m2} — ABNORMAL LOW (ref >59)
GFR calc non Af Amer: 44 mL/min/{1.73_m2} — ABNORMAL LOW (ref >59)
GLUCOSE: 95 mg/dL (ref 65–99)
Potassium: 4.2 mmol/L (ref 3.5–5.2)
SODIUM: 139 mmol/L (ref 134–144)

## 2017-11-18 NOTE — Patient Instructions (Addendum)
Return for a follow up appointment as scheduled with Dr. Tamala Julian  Your blood pressure goal is less than 130/80  Check your blood pressure at home daily (if able) and keep record of the readings.  Take your BP meds as follows: CONTINUE lisinopril/hydrochlorothizide 10/12.5mg  once daily   Bring all of your meds, your BP cuff and your record of home blood pressures to your next appointment.  Exercise as you're able, try to walk approximately 30 minutes per day.  Keep salt intake to a minimum, especially watch canned and prepared boxed foods.  Eat more fresh fruits and vegetables and fewer canned items.  Avoid eating in fast food restaurants.    HOW TO TAKE YOUR BLOOD PRESSURE: . Rest 5 minutes before taking your blood pressure. .  Don't smoke or drink caffeinated beverages for at least 30 minutes before. . Take your blood pressure before (not after) you eat. . Sit comfortably with your back supported and both feet on the floor (don't cross your legs). . Elevate your arm to heart level on a table or a desk. . Use the proper sized cuff. It should fit smoothly and snugly around your bare upper arm. There should be enough room to slip a fingertip under the cuff. The bottom edge of the cuff should be 1 inch above the crease of the elbow. . Ideally, take 3 measurements at one sitting and record the average.

## 2017-11-24 ENCOUNTER — Encounter: Payer: Self-pay | Admitting: Interventional Cardiology

## 2017-11-24 ENCOUNTER — Telehealth: Payer: Self-pay | Admitting: *Deleted

## 2017-11-24 DIAGNOSIS — N183 Chronic kidney disease, stage 3 unspecified: Secondary | ICD-10-CM

## 2017-11-24 NOTE — Telephone Encounter (Signed)
Informed pt of results. Pt verbalized understanding. Pt will have labs 7/22.

## 2017-11-24 NOTE — Telephone Encounter (Signed)
-----   Message from Belva Crome, MD sent at 11/24/2017 10:49 AM EDT ----- Let the patient know the kidney function is slightly improved compared to prior study from 2 weeks ago.  No change in therapy is needed at this time.  Please repeat a basic metabolic panel in 3 months.  Currently stage III chronic kidney disease. A copy will be sent to Maury Dus, MD

## 2017-12-18 ENCOUNTER — Other Ambulatory Visit: Payer: Self-pay | Admitting: Interventional Cardiology

## 2017-12-20 ENCOUNTER — Other Ambulatory Visit: Payer: Self-pay | Admitting: Interventional Cardiology

## 2017-12-22 NOTE — Telephone Encounter (Signed)
Outpatient Medication Detail    Disp Refills Start End   atorvastatin (LIPITOR) 80 MG tablet 90 tablet 3 12/18/2017    Sig: TAKE 1 TABLET BY MOUTH ONCE DAILY AT 6PM   Sent to pharmacy as: atorvastatin (LIPITOR) 80 MG tablet   E-Prescribing Status: Sent to pharmacy (12/18/2017 11:54 AM EDT)   Pharmacy   [place], [place]

## 2017-12-23 ENCOUNTER — Other Ambulatory Visit: Payer: Self-pay | Admitting: Interventional Cardiology

## 2017-12-23 MED ORDER — ATORVASTATIN CALCIUM 80 MG PO TABS: ORAL_TABLET | ORAL | 3 refills | Status: DC

## 2017-12-23 NOTE — Telephone Encounter (Signed)
New Message:      *STAT* If patient is at the pharmacy, call can be transferred to refill team.   1. Which medications need to be refilled? (please list name of each medication and dose if known) atorvastatin (LIPITOR) 80 MG tablet  2. Which pharmacy/location (including street and city if local pharmacy) is medication to be sent to?[facility], [address]  3. Do they need a 30 day or 90 day supply? [place]

## 2017-12-23 NOTE — Telephone Encounter (Signed)
Pt's medication was sent to pt's pharmacy as requested. Confirmation received.  °

## 2017-12-31 ENCOUNTER — Other Ambulatory Visit: Payer: Self-pay | Admitting: Interventional Cardiology

## 2018-02-23 ENCOUNTER — Other Ambulatory Visit: Payer: 59

## 2018-02-27 ENCOUNTER — Other Ambulatory Visit: Payer: 59

## 2018-03-31 ENCOUNTER — Ambulatory Visit (INDEPENDENT_AMBULATORY_CARE_PROVIDER_SITE_OTHER): Payer: 59 | Admitting: Orthopaedic Surgery

## 2018-03-31 ENCOUNTER — Telehealth (INDEPENDENT_AMBULATORY_CARE_PROVIDER_SITE_OTHER): Payer: Self-pay | Admitting: Orthopaedic Surgery

## 2018-03-31 ENCOUNTER — Ambulatory Visit (INDEPENDENT_AMBULATORY_CARE_PROVIDER_SITE_OTHER): Payer: Self-pay

## 2018-03-31 ENCOUNTER — Encounter (INDEPENDENT_AMBULATORY_CARE_PROVIDER_SITE_OTHER): Payer: Self-pay | Admitting: Orthopaedic Surgery

## 2018-03-31 DIAGNOSIS — M25511 Pain in right shoulder: Secondary | ICD-10-CM

## 2018-03-31 DIAGNOSIS — M7541 Impingement syndrome of right shoulder: Secondary | ICD-10-CM | POA: Diagnosis not present

## 2018-03-31 DIAGNOSIS — G8929 Other chronic pain: Secondary | ICD-10-CM

## 2018-03-31 MED ORDER — METHYLPREDNISOLONE ACETATE 40 MG/ML IJ SUSP
40.0000 mg | INTRAMUSCULAR | Status: AC | PRN
  Administered 2018-03-31: 40 mg via INTRA_ARTICULAR

## 2018-03-31 MED ORDER — TRAMADOL HCL 50 MG PO TABS: 50.0000 mg | ORAL_TABLET | Freq: Four times a day (QID) | ORAL | 0 refills | Status: DC | PRN

## 2018-03-31 MED ORDER — LIDOCAINE HCL 1 % IJ SOLN
3.0000 mL | INTRAMUSCULAR | Status: AC | PRN
  Administered 2018-03-31: 3 mL

## 2018-03-31 NOTE — Telephone Encounter (Signed)
Patient went to [facility] to pick up tramadol and they would only fill it for 5 days. He is requesting the rx be sent to [facility] on [address]. Patients # [phone]

## 2018-03-31 NOTE — Telephone Encounter (Signed)
Can you do this please, they will only fill 5 day supply-  That is their new policy

## 2018-03-31 NOTE — Progress Notes (Signed)
Office Visit Note   Patient: Brett Clark           Date of Birth: 12/14/1955           MRN: [id number] Visit Date: 03/31/2018              Requested by: Kary Kos, MD [address] [place] [address] [place], [place] [address] PCP: Maury Dus, MD   Assessment & Plan: Visit Diagnoses:  1. Chronic right shoulder pain   2. Impingement syndrome of right shoulder     Plan: The patient has classic signs, symptoms and radiologic findings of right shoulder impingement syndrome.  I talked about trying a steroid injection in the subacromial outlet area and explained the risk and benefits of this.  He was definitely agreeable to this and tolerated the injection well.  He did feel better with just the lidocaine in that area.  I explained in detail with this treatment involves.  I will send in some tramadol for his pain.  I would like to see him back in 3 weeks to see how is doing overall.  All question concerns were answered and addressed.  Follow-Up Instructions: Return in about 3 weeks (around 04/21/2018).   Orders:  Orders Placed This Encounter  Procedures  . Large Joint Inj  . XR Shoulder Right   Meds ordered this encounter  Medications  . traMADol (ULTRAM) 50 MG tablet    Sig: Take 1-2 tablets (50-100 mg total) by mouth every 6 (six) hours as needed.    Dispense:  60 tablet    Refill:  0      Procedures: Large Joint Inj: R subacromial bursa on 03/31/2018 8:32 AM Indications: pain and diagnostic evaluation Details: 22 G 1.5 in needle  Arthrogram: No  Medications: 3 mL lidocaine 1 %; 40 mg methylPREDNISolone acetate 40 MG/ML Outcome: tolerated well, no immediate complications Procedure, treatment alternatives, risks and benefits explained, specific risks discussed. Consent was given by the patient. Immediately prior to procedure a time out was called to verify the correct patient, procedure,  equipment, support staff and site/side marked as required. Patient was prepped and draped in the usual sterile fashion.       Clinical Data: No additional findings.   Subjective: Chief Complaint  Patient presents with  . Right Shoulder - Pain  Patient is sent to me from Dr. Saintclair Halsted to evaluate right shoulder pain.  Is been hurting for about a year now slowly getting worse with no known injury.  The pain does come and go but he cannot lay on that side at night.  He is done a lot of hard work over the years and reaching overhead and behind him to start and get significantly painful.  He does have a hard time putting his shirt on and off as well.  The pain seems to be all around the subacromial area and the [facility] joint is where he points to on the right shoulder.  He is right-hand dominant.  He has been told not to take anti-inflammatories due to previous kidney surgery.  HPI  Review of Systems He currently denies any chest pain or shortness of breath.  He denies any fever and chills.  Objective: Vital Signs: There were no vitals  taken for this visit.  Physical Exam Is alert and oriented and in no acute distress. Ortho Exam Examination of his right shoulder shows that he has significant pain past 90 degrees of abduction.  He has positive Neer and Hawkins on the right shoulder as well.  His internal rotation with adduction is just to the lower lumbar spine where it is higher on the opposite side.  He is negative liftoff exam. Specialty Comments:  No specialty comments available.  Imaging: Xr Shoulder Right  Result Date: 03/31/2018 3 views of the right shoulder are obtained and show decrease in the subacromial outlet and acromioclavicular arthritic changes.  The glenohumeral joint is well-maintained.  There is otherwise no acute findings.    PMFS History: Patient Active Problem List   Diagnosis Date Noted  . Impingement syndrome of right shoulder 03/31/2018  . CKD (chronic kidney  disease), stage III ([facility]) 11/24/2017  . Hypertension 11/18/2017  . Renal cell carcinoma ([place]) 09/19/2015  . CAD (coronary artery disease)   . Dyslipidemia   . Ischemic cardiomyopathy   . Tobacco abuse   . CAD S/P PCI mLAD 2.5 x 18 mm Xience Alpine DES 06/24/2014    Class: Status post  . Non-STEMI (non-ST elevated myocardial infarction) ([place]) 06/22/2014  . Abnormal abdominal CT scan 06/22/2014  . Epigastric abdominal tenderness 06/22/2014   Past Medical History:  Diagnosis Date  . Abnormal abdominal CT scan    a. Abnl lesion on kidney/liver noted on CT @ [facility]) - no further details available.  Marland Kitchen CAD (coronary artery disease)    a. 06/2014 NSTEMI/PCI: LM nl, LAD 80 (2.5x18 Xience DES), LCX nl, RCA nl, EF 40-45%.  Marland Kitchen Dyslipidemia   . Headache    migraine and tension  . Ischemic cardiomyopathy    a. 06/2014 EF 40-45% by LV gram;  b. 06/2014 Echo: EF 50-55%, mild LVH, distal anterior, apical, and inferoapical wall motion abnormalities, DD, mild MR.  . Myocardial infarction ([place]) nov 2015  . Right renal mass   . Tobacco abuse     Family History  Problem Relation Age of Onset  . CAD Father   . COPD Father     Past Surgical History:  Procedure Laterality Date  . BACK SURGERY  years ago   neck  . CARDIAC CATHETERIZATION  06/23/2014   DES to mid LAD, single v disease  . hydrocele surgery  15 years ago  . LEFT HEART CATHETERIZATION WITH CORONARY ANGIOGRAM N/A 06/23/2014   Procedure: LEFT HEART CATHETERIZATION WITH CORONARY ANGIOGRAM;  Surgeon: Peter M Martinique, MD;  Location: [facility] CATH LAB;  Service: Cardiovascular;  Laterality: N/A;  . ROBOTIC ASSITED PARTIAL NEPHRECTOMY Right 01/18/2015   Procedure: ROBOTIC ASSITED PARTIAL NEPHRECTOMY, INTRAOPERATIVE ULTRASOUND, AND INDOCYANINE GREEN DYE;  Surgeon: Alexis Frock, MD;  Location: [facility] ORS;  Service: Urology;  Laterality: Right;   Social History   Occupational History  . Not on file  Tobacco Use  . Smoking status: Former  Smoker    Packs/day: 1.00    Years: 20.00    Pack years: 20.00    Types: Cigarettes  . Smokeless tobacco: Never Used  . Tobacco comment: quit smoking years ago  Substance and Sexual Activity  . Alcohol use: Yes    Comment: occasional  . Drug use: No  . Sexual activity: Not on file

## 2018-04-15 ENCOUNTER — Ambulatory Visit (INDEPENDENT_AMBULATORY_CARE_PROVIDER_SITE_OTHER): Payer: 59 | Admitting: Orthopaedic Surgery

## 2018-04-15 ENCOUNTER — Encounter (INDEPENDENT_AMBULATORY_CARE_PROVIDER_SITE_OTHER): Payer: Self-pay | Admitting: Orthopaedic Surgery

## 2018-04-15 DIAGNOSIS — M25511 Pain in right shoulder: Secondary | ICD-10-CM

## 2018-04-15 DIAGNOSIS — M7541 Impingement syndrome of right shoulder: Secondary | ICD-10-CM

## 2018-04-15 DIAGNOSIS — G8929 Other chronic pain: Secondary | ICD-10-CM | POA: Diagnosis not present

## 2018-04-15 MED ORDER — METHYLPREDNISOLONE 4 MG PO TABS: ORAL_TABLET | ORAL | 0 refills | Status: DC

## 2018-04-15 NOTE — Progress Notes (Signed)
The patient is in follow-up today after having a right shoulder injection to treat the pain from shoulder impingement.  He said that the injection is worked greatly.  He says his shoulder is 95% better.  On exam he is got excellent range of motion of his right shoulder.  His right elbow though he lacks full extension and he can definitely tell that there is arthritic changes throughout his right elbow joint.  However again the shoulder is doing well.  He uses his rotator cuff correctly to abduct and externally rotate the shoulder and there is good strength.  This point I still advised to take 1 Aleve twice a day for at least the next week.  I will send him in a steroid taper to have on hold if his pain comes back.  Of also told him that I would be happy to provide an injection in his shoulder in October if the pain comes back.  All questions concerns were answered and addressed.  Follow-up will be as needed.

## 2018-07-13 ENCOUNTER — Encounter (INDEPENDENT_AMBULATORY_CARE_PROVIDER_SITE_OTHER): Payer: Self-pay | Admitting: Orthopaedic Surgery

## 2018-07-13 ENCOUNTER — Other Ambulatory Visit (INDEPENDENT_AMBULATORY_CARE_PROVIDER_SITE_OTHER): Payer: Self-pay

## 2018-07-13 ENCOUNTER — Ambulatory Visit (INDEPENDENT_AMBULATORY_CARE_PROVIDER_SITE_OTHER): Payer: 59 | Admitting: Orthopaedic Surgery

## 2018-07-13 DIAGNOSIS — M25511 Pain in right shoulder: Secondary | ICD-10-CM | POA: Diagnosis not present

## 2018-07-13 DIAGNOSIS — G8929 Other chronic pain: Secondary | ICD-10-CM

## 2018-07-13 MED ORDER — METHYLPREDNISOLONE ACETATE 40 MG/ML IJ SUSP
40.0000 mg | INTRAMUSCULAR | Status: AC | PRN
  Administered 2018-07-13: 40 mg via INTRA_ARTICULAR

## 2018-07-13 MED ORDER — LIDOCAINE HCL 1 % IJ SOLN
3.0000 mL | INTRAMUSCULAR | Status: AC | PRN
  Administered 2018-07-13: 3 mL

## 2018-07-13 NOTE — Progress Notes (Signed)
Office Visit Note   Patient: Brett Clark           Date of Birth: [birth year]           MRN: [id number] Visit Date: 07/13/2018              Requested by: Maury Dus, MD [place],  [id number] PCP: Maury Dus, MD   Assessment & Plan: Visit Diagnoses:  1. Chronic right shoulder pain     Plan: I will try a second steroid injection today and subacromial outlet.  He understands the risk and benefits of injections like this.  I placed 1 in the right shoulder without difficulty.  At this point I do feel an MRI is medically warranted given the failure of conservative treatment and his continued pain and weakness in his right shoulder.  We will work on ordering an MRI and see him in follow-up for this.  All questions were answered and addressed.  Follow-Up Instructions: Return in about 2 weeks (around 07/27/2018).   Orders:  No orders of the defined types were placed in this encounter.  No orders of the defined types were placed in this encounter.     Procedures: Large Joint Inj: R subacromial bursa on 07/13/2018 8:26 AM Indications: pain and diagnostic evaluation Details: 22 G 1.5 in needle  Arthrogram: No  Medications: 3 mL lidocaine 1 %; 40 mg methylPREDNISolone acetate 40 MG/ML Outcome: tolerated well, no immediate complications Procedure, treatment alternatives, risks and benefits explained, specific risks discussed. Consent was given by the patient. Immediately prior to procedure a time out was called to verify the correct patient, procedure, equipment, support staff and site/side marked as required. Patient was prepped and draped in the usual sterile fashion.       Clinical Data: No additional findings.   Subjective: Chief Complaint  Patient presents with  . Right Shoulder - Pain  The patient has continued right shoulder pain.  We have tried a steroid injection subacromial space and help temporize things for a while but he still  having pain with overhead motion reaching behind him and severe.  He gets a burning tingling sensation as well.  He is also had cervical spine surgery in the past and he said he is got some other distant other areas in his neck later creating some burning and numbness and tingling with this not related to the shoulder and I agree with this as well.  He has been having trouble reaching behind him and overhead with pain in the subacromial outlet.  The first injection I tried helped significantly for a while but and that is worn off.  He would like to have another injection today.  He is not a diabetic.  HPI  Review of Systems Currently denies any headache, chest pain, shortness of breath, fever, chills, nausea, vomiting.  Objective: Vital Signs: There were no vitals taken for this visit.  Physical Exam Is alert and orient x3 and in no acute distress Ortho Exam Examination of his right shoulder shows pain with abduction and overhead motion.  There is some slight weakness with abduction extra rotation as well.  He has global tenderness with exam.  His internal Tatian with adduction is limited. Specialty Comments:  No specialty comments available.  Imaging: No results found.   PMFS History: Patient Active Problem List   Diagnosis Date Noted  . Impingement syndrome of right shoulder 03/31/2018  . CKD (chronic kidney disease), stage III ([place])  11/24/2017  . Hypertension 11/18/2017  . Renal cell carcinoma ([facility]) 09/19/2015  . CAD (coronary artery disease)   . Dyslipidemia   . Ischemic cardiomyopathy   . Tobacco abuse   . CAD S/P PCI mLAD 2.5 x 18 mm Xience Alpine DES 06/24/2014    Class: Status post  . Non-STEMI (non-ST elevated myocardial infarction) ([place]) 06/22/2014  . Abnormal abdominal CT scan 06/22/2014  . Epigastric abdominal tenderness 06/22/2014   Past Medical History:  Diagnosis Date  . Abnormal abdominal CT scan    a. Abnl lesion on kidney/liver noted on CT @ [facility])  - no further details available.  Marland Kitchen CAD (coronary artery disease)    a. 06/2014 NSTEMI/PCI: LM nl, LAD 80 (2.5x18 Xience DES), LCX nl, RCA nl, EF 40-45%.  Marland Kitchen Dyslipidemia   . Headache    migraine and tension  . Ischemic cardiomyopathy    a. 06/2014 EF 40-45% by LV gram;  b. 06/2014 Echo: EF 50-55%, mild LVH, distal anterior, apical, and inferoapical wall motion abnormalities, DD, mild MR.  . Myocardial infarction ([place]) nov 2015  . Right renal mass   . Tobacco abuse     Family History  Problem Relation Age of Onset  . CAD Father   . COPD Father     Past Surgical History:  Procedure Laterality Date  . BACK SURGERY  years ago   neck  . CARDIAC CATHETERIZATION  06/23/2014   DES to mid LAD, single v disease  . hydrocele surgery  15 years ago  . LEFT HEART CATHETERIZATION WITH CORONARY ANGIOGRAM N/A 06/23/2014   Procedure: LEFT HEART CATHETERIZATION WITH CORONARY ANGIOGRAM;  Surgeon: Peter M Martinique, MD;  Location: [facility] CATH LAB;  Service: Cardiovascular;  Laterality: N/A;  . ROBOTIC ASSITED PARTIAL NEPHRECTOMY Right 01/18/2015   Procedure: ROBOTIC ASSITED PARTIAL NEPHRECTOMY, INTRAOPERATIVE ULTRASOUND, AND INDOCYANINE GREEN DYE;  Surgeon: Alexis Frock, MD;  Location: [facility]S;  Service: Urology;  Laterality: Right;   Social History   Occupational History  . Not on file  Tobacco Use  . Smoking status: Former Smoker    Packs/day: 1.00    Years: 20.00    Pack years: 20.00    Types: Cigarettes  . Smokeless tobacco: Never Used  . Tobacco comment: quit smoking years ago  Substance and Sexual Activity  . Alcohol use: Yes    Comment: occasional  . Drug use: No  . Sexual activity: Not on file

## 2018-07-21 ENCOUNTER — Telehealth (INDEPENDENT_AMBULATORY_CARE_PROVIDER_SITE_OTHER): Payer: Self-pay | Admitting: Orthopaedic Surgery

## 2018-07-21 ENCOUNTER — Other Ambulatory Visit (INDEPENDENT_AMBULATORY_CARE_PROVIDER_SITE_OTHER): Payer: Self-pay

## 2018-07-21 ENCOUNTER — Other Ambulatory Visit (INDEPENDENT_AMBULATORY_CARE_PROVIDER_SITE_OTHER): Payer: Self-pay | Admitting: Orthopaedic Surgery

## 2018-07-21 ENCOUNTER — Other Ambulatory Visit: Payer: Self-pay | Admitting: Orthopaedic Surgery

## 2018-07-21 DIAGNOSIS — S0540XA Penetrating wound of orbit with or without foreign body, unspecified eye, initial encounter: Secondary | ICD-10-CM

## 2018-07-21 DIAGNOSIS — M542 Cervicalgia: Secondary | ICD-10-CM

## 2018-07-21 NOTE — Telephone Encounter (Signed)
Ok.  If it can be added, then a cervical spine MRI with contrast.

## 2018-07-21 NOTE — Telephone Encounter (Signed)
Patient called stating that he needs his MRI order to include his right shoulder and his neck with contrast.  Patient would like for you to call him as soon as you get this approved from Dr. Ninfa Linden, his appointment is Thursday morning at 6:30.  CB#[phone].  Thank you.

## 2018-07-21 NOTE — Telephone Encounter (Signed)
Patient's wife called stating that she spoke with [facility] and that they do have a time slot for them to do both a Cervical Spine and Shoulder MRI on December 28th.  The patient has gotten this approved from his insurance and is just waiting to see if Dr. Ninfa Linden will order the cervical mri as well

## 2018-07-21 NOTE — Telephone Encounter (Signed)
He does not need contrast for the shoulder MRI.  As far as a cervical spine MRI, he has had cervical spine surgery by someone else in town and they are more of an authority on what he needs for his neck and what type of study that would be.  He would probably be difficult to get this type of study approved in such a short amount of time and in the same time slot given the fact that I am only seeing him for shoulder next well-documented.

## 2018-07-21 NOTE — Telephone Encounter (Signed)
See below  probably not possible this week

## 2018-07-21 NOTE — Telephone Encounter (Signed)
Patient states his neck doctor's nurse actually suggested we do the neck MRI for him?

## 2018-07-23 ENCOUNTER — Other Ambulatory Visit: Payer: 59

## 2018-07-27 ENCOUNTER — Ambulatory Visit (INDEPENDENT_AMBULATORY_CARE_PROVIDER_SITE_OTHER): Payer: 59 | Admitting: Orthopaedic Surgery

## 2018-08-01 ENCOUNTER — Ambulatory Visit
Admission: RE | Admit: 2018-08-01 | Discharge: 2018-08-01 | Disposition: A | Discharge status: Home / Self Care | Payer: 59 | Source: Ambulatory Visit | Attending: Orthopaedic Surgery | Admitting: Orthopaedic Surgery

## 2018-08-01 DIAGNOSIS — G8929 Other chronic pain: Secondary | ICD-10-CM

## 2018-08-01 DIAGNOSIS — M542 Cervicalgia: Secondary | ICD-10-CM

## 2018-08-01 DIAGNOSIS — M25511 Pain in right shoulder: Principal | ICD-10-CM

## 2018-08-01 MED ORDER — GADOBENATE DIMEGLUMINE 529 MG/ML IV SOLN
9.0000 mL | Freq: Once | INTRAVENOUS | Status: AC | PRN
  Administered 2018-08-01: 9 mL via INTRAVENOUS

## 2018-08-04 ENCOUNTER — Ambulatory Visit (INDEPENDENT_AMBULATORY_CARE_PROVIDER_SITE_OTHER): Payer: 59 | Admitting: Orthopaedic Surgery

## 2018-08-04 ENCOUNTER — Encounter (INDEPENDENT_AMBULATORY_CARE_PROVIDER_SITE_OTHER): Payer: Self-pay | Admitting: Orthopaedic Surgery

## 2018-08-04 DIAGNOSIS — M75121 Complete rotator cuff tear or rupture of right shoulder, not specified as traumatic: Secondary | ICD-10-CM

## 2018-08-04 NOTE — Progress Notes (Signed)
The patient is following up to go over an MRI of his right shoulder.  He is only 62 years old and very active and we have been seeing him for a while with right shoulder pain.  After the very conservative treatment including activity modification and therapy as well as anti-inflammatories and steroid injections, an MRI was warranted given the continued pain and weakness he was having.  He is here to go over this today.  On exam he does exhibit weakness of the rotator cuff with abduction and external rotation.  He still has excellent range of motion of the shoulder.  His shoulder abducts well without using his deltoids extensively but there is again some deficit in the rotator cuff on the right side.  The MRI does show a complete full-thickness tear of the distal aspect of the supraspinatus tendon with about 2 cm of retraction.  There is no muscle atrophy.  The remainder of the rotator cuff looks good.  The glenohumeral joint looks good.  I went over a shoulder model explained to him what is going on with the shoulder.  I have recommended an arthroscopic rotator cuff repair they can be done as an outpatient.  I described what the surgery would involve as well as the risk and benefits of surgery.  We talked about his intraoperative and postoperative course and what the recovery would be like.  All question concerns were answered and addressed.  He will think about this.  I gave him our surgery schedulers card and he will call when he would like to have this potentially schedule for him.

## 2018-09-01 ENCOUNTER — Telehealth: Payer: Self-pay | Admitting: *Deleted

## 2018-09-01 NOTE — Telephone Encounter (Signed)
   Primary Cardiologist: Sinclair Grooms, MD  Chart reviewed as part of pre-operative protocol coverage.   Patient has a history of MI managed with PCI/DES to LAD in 2015. No intervention since that time.  Dr. Tamala Julian, can you comment on whether patient may stop aspirin, and for how long, for upcoming shoulder surgery?  Please route your response back to P CV DIV PREOP.  Abigail Butts, PA-C 09/01/2018, 3:45 PM

## 2018-09-01 NOTE — Telephone Encounter (Signed)
   [facility] Pre-operative Risk Assessment    Request for surgical clearance:  1. What type of surgery is being performed? RIGHT RCR, RCR, SAD, POSSIBLE BICEP TENODESIS   2. When is this surgery scheduled? 11/02/18   3. What type of clearance is required (medical clearance vs. Pharmacy clearance to hold med vs. Both)? MEDICAL  4. Are there any medications that need to be held prior to surgery and how long?ASA   5. Practice name and name of physician performing surgery? [facility]; DR. Stann Mainland   6. What is your office phone number [phone]    7.   What is your office fax number [phone]  8.   Anesthesia type (None, local, MAC, general) ? CHOICE    Brett Clark 09/01/2018, 10:24 AM  _________________________________________________________________   (provider comments below)

## 2018-09-03 NOTE — Telephone Encounter (Signed)
° °  Patient returning call Pre op team unavailable Please return call on cell phone

## 2018-09-03 NOTE — Telephone Encounter (Signed)
   Primary Cardiologist: Sinclair Grooms, MD  Chart reviewed as part of pre-operative protocol coverage. Left message with patient to call back for further preop assessment.   Per Dr. Tamala Julian, patient can hold aspirin 5 days prior to his upcoming shoulder surgery. He should restart aspirin when cleared to do so by his orthopedist.    Abigail Butts, PA-C 09/03/2018, 4:14 PM

## 2018-09-03 NOTE — Telephone Encounter (Signed)
OKAY TO HOLD ASPIRIN FOR SURGERY, UP TO 5 DAYS.

## 2018-09-07 DIAGNOSIS — D229 Melanocytic nevi, unspecified: Secondary | ICD-10-CM | POA: Insufficient documentation

## 2018-09-07 DIAGNOSIS — C4491 Basal cell carcinoma of skin, unspecified: Secondary | ICD-10-CM | POA: Insufficient documentation

## 2018-09-08 NOTE — Telephone Encounter (Signed)
   Primary Cardiologist:Henry Nicholes Stairs III, MD  Chart reviewed as part of pre-operative protocol coverage. Because of Brett Clark's past medical history and time since last visit, he/she will require a follow-up visit in order to better assess preoperative cardiovascular risk.  Pre-op covering staff: - Please schedule appointment and call patient to inform them. - Please contact requesting surgeon's office via preferred method (i.e, phone, fax) to inform them of need for appointment prior to surgery.  If applicable, this message will also be routed to pharmacy pool and/or primary cardiologist for input on holding anticoagulant/antiplatelet agent as requested below so that this information is available at time of patient's appointment.   Of note, Dr. Tamala Julian has already commented on antiplatelet.   Tami Lin Chosen Geske, PA  09/08/2018, 9:26 AM

## 2018-09-08 NOTE — Telephone Encounter (Signed)
Pt is scheduled to see Dr Tamala Julian on 09/11/2018 for surgical clearance.  Pt is aware.

## 2018-09-10 NOTE — Progress Notes (Signed)
Cardiology Office Note:    Date:  09/11/2018   ID:  KROSBY RITCHIE, DOB 18-Jul-1956, MRN [id number]  PCP:  Maury Dus, MD  Cardiologist:  Sinclair Grooms, MD   Referring MD: Maury Dus, MD   Chief Complaint  Patient presents with  . Coronary Artery Disease    History of Present Illness:    Brett Clark is a 63 y.o. male with a hx of CAD with prior anterior wall infarction requiring DES mid LAD11/2015, partial right nephrectomy for renal cell carcinoma (clear cell), hyperlipidemia, and chronic combined systolic and diastolic heart failure with last documented EF50-55%.   He is here primarily to get clearance for surgery of right rotator cuff.  He had DES implantation in the LAD in 2015.  Dual antiplatelet therapy has been discontinued.  He is on aspirin 81 mg/day.  He has no symptoms of angina or dyspnea.  No symptoms of heart failure or clinical evidence of heart failure.  He is physically quite active.  He has positional left lateral chest discomfort that comes on when he tries to use the left arm to wash his right shoulder.  The twisting brings on a spasmodic type cramp in the left lower costal region.  It goes away with changing position.  He has not had palpitations, syncope, and denies lower extremity edema.  In conversation, he reveals that his wife is concerned about significant snoring.  Past Medical History:  Diagnosis Date  . Abnormal abdominal CT scan    a. Abnl lesion on kidney/liver noted on CT @ OSH [facility]) - no further details available.  Marland Kitchen CAD (coronary artery disease)    a. 06/2014 NSTEMI/PCI: LM nl, LAD 80 (2.5x18 Xience DES), LCX nl, RCA nl, EF 40-45%.  Marland Kitchen Dyslipidemia   . Headache    migraine and tension  . Ischemic cardiomyopathy    a. 06/2014 EF 40-45% by LV gram;  b. 06/2014 Echo: EF 50-55%, mild LVH, distal anterior, apical, and inferoapical wall motion abnormalities, DD, mild MR.  . Myocardial infarction ([place]) nov 2015  . Right renal  mass   . Tobacco abuse     Past Surgical History:  Procedure Laterality Date  . BACK SURGERY  years ago   neck  . CARDIAC CATHETERIZATION  06/23/2014   DES to mid LAD, single v disease  . hydrocele surgery  15 years ago  . LEFT HEART CATHETERIZATION WITH CORONARY ANGIOGRAM N/A 06/23/2014   Procedure: LEFT HEART CATHETERIZATION WITH CORONARY ANGIOGRAM;  Surgeon: Peter M Martinique, MD;  Location: [facility] CATH LAB;  Service: Cardiovascular;  Laterality: N/A;  . ROBOTIC ASSITED PARTIAL NEPHRECTOMY Right 01/18/2015   Procedure: ROBOTIC ASSITED PARTIAL NEPHRECTOMY, INTRAOPERATIVE ULTRASOUND, AND INDOCYANINE GREEN DYE;  Surgeon: Alexis Frock, MD;  Location: [facility] ORS;  Service: Urology;  Laterality: Right;    Current Medications: Current Meds  Medication Sig  . aspirin EC 81 MG tablet Take 1 tablet (81 mg total) by mouth daily.  Marland Kitchen atorvastatin (LIPITOR) 80 MG tablet TAKE 1 TABLET BY MOUTH ONCE DAILY AT  6PM  . fenofibrate 160 MG tablet TAKE 1 TABLET BY MOUTH ONCE DAILY  . lisinopril-hydrochlorothiazide (PRINZIDE,ZESTORETIC) 10-12.5 MG tablet Take 1 tablet by mouth daily.  . meloxicam (MOBIC) 7.5 MG tablet Take 1 tablet by mouth as needed.  . methylPREDNISolone (MEDROL) 4 MG tablet Medrol dose pack. Take as instructed  . nitroGLYCERIN (NITROSTAT) 0.4 MG SL tablet Place 1 tablet (0.4 mg total) under the tongue every 5 (five) minutes  x 3 doses as needed for chest pain.  . traMADol (ULTRAM) 50 MG tablet Take 1-2 tablets (50-100 mg total) by mouth every 6 (six) hours as needed.     Allergies:   Codeine   Social History   Socioeconomic History  . Marital status: Married    Spouse name: Not on file  . Number of children: Not on file  . Years of education: Not on file  . Highest education level: Not on file  Occupational History  . Not on file  Social Needs  . Financial resource strain: Not on file  . Food insecurity:    Worry: Not on file    Inability: Not on file  . Transportation needs:     Medical: Not on file    Non-medical: Not on file  Tobacco Use  . Smoking status: Former Smoker    Packs/day: 1.00    Years: 20.00    Pack years: 20.00    Types: Cigarettes  . Smokeless tobacco: Never Used  . Tobacco comment: quit smoking years ago  Substance and Sexual Activity  . Alcohol use: Yes    Comment: occasional  . Drug use: No  . Sexual activity: Not on file  Lifestyle  . Physical activity:    Days per week: Not on file    Minutes per session: Not on file  . Stress: Not on file  Relationships  . Social connections:    Talks on phone: Not on file    Gets together: Not on file    Attends religious service: Not on file    Active member of club or organization: Not on file    Attends meetings of clubs or organizations: Not on file    Relationship status: Not on file  Other Topics Concern  . Not on file  Social History Narrative  . Not on file     Family History: The patient's family history includes CAD in his father; COPD in his father.  ROS:   Please see the history of present illness.    Snoring, right shoulder discomfort, he notes difficulty with tremor in the left hand when he tries to eat.  He has been eating with the left arm/hand because of the right rotator cuff problem.  All other systems reviewed and are negative.  EKGs/Labs/Other Studies Reviewed:    The following studies were reviewed today: No new cardiac evaluation or data  EKG:  EKG normal sinus rhythm, heart rate 79 bpm, right axis deviation, incomplete right bundle branch block.  When compared to prior tracings.  When compared to March 2019, no changes occurred.  Recent Labs: 11/18/2017: BUN 19; Creatinine, Ser 1.66; Potassium 4.2; Sodium 139  Recent Lipid Panel    Component Value Date/Time   CHOL 132 09/20/2015 0906   TRIG 91 09/20/2015 0906   HDL 32 (L) 09/20/2015 0906   CHOLHDL 4.1 09/20/2015 0906   VLDL 18 09/20/2015 0906   LDLCALC 82 09/20/2015 0906    Physical Exam:    VS:  BP  128/90   Pulse 79   Ht 6' 1.2" (1.859 m)   Wt 190 lb 6.4 oz (86.4 kg)   SpO2 100%   BMI 24.98 kg/m     Wt Readings from Last 3 Encounters:  09/11/18 190 lb 6.4 oz (86.4 kg)  10/14/17 192 lb 3.2 oz (87.2 kg)  11/25/16 187 lb (84.8 kg)     GEN: Healthy-appearing. No acute distress HEENT: Normal NECK: No JVD. LYMPHATICS: No lymphadenopathy  CARDIAC: RRR.  no murmur, nogallop, noedema VASCULAR: 2+ bilateral radial and pedal pulses, no bruits RESPIRATORY:  Clear to auscultation without rales, wheezing or rhonchi  ABDOMEN: Soft, non-tender, non-distended, No pulsatile mass, MUSCULOSKELETAL: Tender right shoulder.  No deformity  SKIN: Warm and dry NEUROLOGIC:  Alert and oriented x 3 PSYCHIATRIC:  Normal affect   ASSESSMENT:    1. Coronary artery disease involving native coronary artery of native heart without angina pectoris   2. Hypertension, unspecified type   3. CKD (chronic kidney disease), stage III ([place])   4. Tobacco abuse   5. Dyslipidemia   6. Preoperative clearance   7. Snoring    PLAN:    In order of problems listed above:  1. Symptomatic from CV standpoint.  Secondary risk prevention is reviewed.  Last lipid panel was done in February 2019 and needs to be repeated.  He is not fasting today and therefore will need to come back at some point soon. 2. Blood pressure is controlled.  Target is 130/80 mmHg. 3. Not addressed 4. Not smoking 5. LDL target less than 70.  We will check labs this year and may have to intensify strategy for control by adding ezetimibe 6. He is clear for upcoming rotator cuff surgery by Dr. Stann Mainland in March.  No further cardiac evaluation is felt needed at this time. 7. I am concerned he has sleep apnea.  He will need to have a sleep study done at some point this year to exclude this as a significant untreated cardiovascular risk factor.  This is of particular concern since this EKG reveals right axis deviation  Overall education and awareness  concerning primary/secondary risk prevention was discussed in detail: LDL less than 70, hemoglobin A1c less than 7, blood pressure target less than 130/80 mmHg, >150 minutes of moderate aerobic activity per week, avoidance of smoking, weight control (via diet and exercise), and continued surveillance/management of/for obstructive sleep apnea.  Therefore, encouraged aerobic activity, discussed the potential need for better blood pressure control but he will decrease salt in his diet as the first step, LDL needs to be less than 70, and a sleep study will be done to exclude OSA.  1 year clinical follow-up   Medication Adjustments/Labs and Tests Ordered: Current medicines are reviewed at length with the patient today.  Concerns regarding medicines are outlined above.  Orders Placed This Encounter  Procedures  . EKG 12-Lead   No orders of the defined types were placed in this encounter.   There are no Patient Instructions on file for this visit.   Signed, Sinclair Grooms, MD  09/11/2018 9:43 AM    [place]

## 2018-09-11 ENCOUNTER — Encounter: Payer: Self-pay | Admitting: Interventional Cardiology

## 2018-09-11 ENCOUNTER — Telehealth: Payer: Self-pay | Admitting: *Deleted

## 2018-09-11 ENCOUNTER — Ambulatory Visit (INDEPENDENT_AMBULATORY_CARE_PROVIDER_SITE_OTHER): Payer: 59 | Admitting: Interventional Cardiology

## 2018-09-11 VITALS — BP 128/90 | HR 79 | Ht 73.2 in | Wt 190.4 lb

## 2018-09-11 DIAGNOSIS — Z720 Tobacco use: Secondary | ICD-10-CM | POA: Diagnosis not present

## 2018-09-11 DIAGNOSIS — I2510 Atherosclerotic heart disease of native coronary artery without angina pectoris: Secondary | ICD-10-CM

## 2018-09-11 DIAGNOSIS — Z01818 Encounter for other preprocedural examination: Secondary | ICD-10-CM

## 2018-09-11 DIAGNOSIS — N183 Chronic kidney disease, stage 3 unspecified: Secondary | ICD-10-CM

## 2018-09-11 DIAGNOSIS — I10 Essential (primary) hypertension: Secondary | ICD-10-CM

## 2018-09-11 DIAGNOSIS — R0683 Snoring: Secondary | ICD-10-CM

## 2018-09-11 DIAGNOSIS — E785 Hyperlipidemia, unspecified: Secondary | ICD-10-CM

## 2018-09-11 NOTE — Telephone Encounter (Signed)
-----   Message from Loren Racer, LPN sent at 12/11/2761  9:53 AM EST ----- Sleep study ordered for pt for snoring.  Please pre cert and schedule.  Anderson Malta, LPN

## 2018-09-11 NOTE — Patient Instructions (Addendum)
Medication Instructions:  Your physician recommends that you continue on your current medications as directed. Please refer to the Current Medication list given to you today.  If you need a refill on your cardiac medications before your next appointment, please call your pharmacy.   Lab work: Your physician recommends that you return for lab work when you are fasting (Lipid, liver).  You will need to be fasting for these labs (nothing to eat or drink after midnight except water and black coffee)  If you have labs (blood work) drawn today and your tests are completely normal, you will receive your results only by: Marland Kitchen MyChart Message (if you have MyChart) OR . A paper copy in the mail If you have any lab test that is abnormal or we need to change your treatment, we will call you to review the results.  Testing/Procedures: Your physician has recommended that you have a sleep study. This test records several body functions during sleep, including: brain activity, eye movement, oxygen and carbon dioxide blood levels, heart rate and rhythm, breathing rate and rhythm, the flow of air through your mouth and nose, snoring, body muscle movements, and chest and belly movement.   Follow-Up: At [facility], you and your health needs are our priority.  As part of our continuing mission to provide you with exceptional heart care, we have created designated Provider Care Teams.  These Care Teams include your primary Cardiologist (physician) and Advanced Practice Providers (APPs -  Physician Assistants and Nurse Practitioners) who all work together to provide you with the care you need, when you need it. You will need a follow up appointment in 12 months.  Please call our office 2 months in advance to schedule this appointment.  You may see Sinclair Grooms, MD or one of the following Advanced Practice Providers on your designated Care Team:   Truitt Merle, NP Cecilie Kicks, NP . Kathyrn Drown, NP  Any  Other Special Instructions Will Be Listed Below (If Applicable).

## 2018-09-14 ENCOUNTER — Telehealth: Payer: Self-pay | Admitting: *Deleted

## 2018-09-14 NOTE — Telephone Encounter (Signed)
-----   Message from Freada Bergeron, [place] sent at 09/11/2018  5:24 PM EST ----- Regarding: pre cert  ----- Message ----- From: Loren Racer, LPN Sent: 1/0/2548   9:53 AM EST To: Freada Bergeron, CMA  Sleep study ordered for pt for snoring.  Please pre cert and schedule.  Anderson Malta, LPN

## 2018-09-14 NOTE — Telephone Encounter (Signed)
PA request submitted to [facility] via web portal for in lab split night sleep study.

## 2018-09-21 ENCOUNTER — Telehealth: Payer: Self-pay | Admitting: *Deleted

## 2018-09-21 NOTE — Telephone Encounter (Signed)
-----   Message from Freada Bergeron, [place] sent at 09/11/2018  5:24 PM EST ----- Regarding: pre cert  ----- Message ----- From: Loren Racer, LPN Sent: 11/04/3242   9:53 AM EST To: Freada Bergeron, CMA  Sleep study ordered for pt for snoring.  Please pre cert and schedule.  Anderson Malta, LPN

## 2018-09-21 NOTE — Telephone Encounter (Signed)
Staff message sent to [facility]. [employer] denied in lab study. Can do HST. Please notify ordering MD.

## 2018-09-23 ENCOUNTER — Telehealth: Payer: Self-pay | Admitting: *Deleted

## 2018-09-23 DIAGNOSIS — I10 Essential (primary) hypertension: Secondary | ICD-10-CM

## 2018-09-23 DIAGNOSIS — R0683 Snoring: Secondary | ICD-10-CM

## 2018-09-23 NOTE — Telephone Encounter (Signed)
-----   Message from Lauralee Evener, [place] sent at 09/21/2018  9:13 AM EST ----- Regarding: RE: pre cert In lab study denied by [facility]. Can order HST. Please inform ordering MD. ----- Message ----- From: Freada Bergeron, CMA Sent: 09/11/2018   5:24 PM EST To: [facility] Sleep Studies Subject: pre cert                                        ----- Message ----- From: Loren Racer, LPN Sent: 01/04/8365   9:53 AM EST To: Freada Bergeron, CMA  Sleep study ordered for pt for snoring.  Please pre cert and schedule.  Anderson Malta, LPN

## 2018-09-24 NOTE — Telephone Encounter (Signed)
  Belva Crome, MD  Freada Bergeron, CMA        Home sleep study okay.     ----- Message -----  From: Freada Bergeron, CMA  Sent: 09/23/2018  4:37 PM EST  To: Belva Crome, MD, Loren Racer, LPN  Subject: [place] Alar: pre cert                   In lab denied home sleep ok?  ----- Message -----  From: Lauralee Evener, CMA  Sent: 09/21/2018  9:13 AM EST  To: Freada Bergeron, CMA  Subject: RE: pre cert                   In lab study denied by [facility]. Can order HST. Please inform ordering MD.

## 2018-09-28 ENCOUNTER — Other Ambulatory Visit: Payer: 59

## 2018-10-01 NOTE — Telephone Encounter (Signed)
Patient is aware and of Home Sleep Study through [facility]. Patient is scheduled for 3/4 at 2:15 to pick up home sleep kit and meet with Respiratory therapist at [facility]. Patient is aware that if this appointment date and time does not work for them they should contact Artis Delay directly at [phone]. Patient is aware that a sleep packet will be sent from [facility] in week. Patient is scheduled for surgery and has declined this HST. Patient will contact our office when he is ready to test.

## 2018-10-07 ENCOUNTER — Encounter (HOSPITAL_BASED_OUTPATIENT_CLINIC_OR_DEPARTMENT_OTHER): Payer: 59

## 2018-10-15 ENCOUNTER — Telehealth: Payer: Self-pay | Admitting: Interventional Cardiology

## 2018-10-15 NOTE — Telephone Encounter (Signed)
New message   Patient's wife states that the patient is scheduled for rotator cup surgery on 11/02/2018. The patient's wife has questions about this surgery in reference to the patient's heart. Patient's wife has questions about if patient's heart condition considered to be an underlying condition for the corona virus.

## 2018-10-15 NOTE — Telephone Encounter (Signed)
Spoke with wife, DPR on file.  She was asking if pt was more susceptible to the corona virus due to his heart condition.  Advised wife when pt seen Dr. Tamala Julian in February pt was doing good.  Advised we have been telling pts to use proper precautions when they are going to be in public since we don't have a lot of info on the corona virus at this point. Wife verbalized understanding and was appreciative for call.

## 2018-10-28 ENCOUNTER — Other Ambulatory Visit: Payer: Self-pay | Admitting: Interventional Cardiology

## 2018-12-21 ENCOUNTER — Telehealth: Payer: Self-pay

## 2018-12-21 NOTE — Telephone Encounter (Signed)
Dr. Tamala Julian OK to hold ASA for shoulder surgery?

## 2018-12-21 NOTE — Telephone Encounter (Signed)
   [facility] Pre-operative Risk Assessment    Request for surgical clearance:  1. What type of surgery is being performed? Right Shoulder scope, rotator cuff repair   2. When is this surgery scheduled? 01/11/19   3. What type of clearance is required (medical clearance vs. Pharmacy clearance to hold med vs. Both)? Both  4. Are there any medications that need to be held prior to surgery and how long?ASA   5. Practice name and name of physician performing surgery? [facility]   6. What is your office phone number [phone]    7.   What is your office fax number [phone] Brett Clark  8.   Anesthesia type (None, local, MAC, general) ? Unknown    Brett Clark 12/21/2018, 3:29 PM  _________________________________________________________________   (provider comments below)

## 2018-12-22 NOTE — Telephone Encounter (Signed)
Okay to hold asa.

## 2018-12-23 NOTE — Telephone Encounter (Signed)
Pt has been cleared by Dr. Tamala Julian in February. I placed a call to pt to update his status and ensure that he has not developed any new cardiac complaints. LM on VM to call back to my direct number in the office.

## 2018-12-23 NOTE — Telephone Encounter (Signed)
   Primary Cardiologist: Sinclair Grooms, MD  Chart reviewed as part of pre-operative protocol coverage. Patient was contacted 12/23/2018 in reference to pre-operative risk assessment for pending surgery as outlined below.  Brett Clark was last seen on 09/11/2018 by Dr. Tamala Julian who had cleared him for surgery. However, his surgery was postponed due the pandemic.  Since that day, DRAYKE GRABEL has done well with no new cardiac complaints. He is fairly active with no chest discomfort or shortness of breath.   Therefore, based on ACC/AHA guidelines, the patient would be at acceptable risk for the planned procedure without further cardiovascular testing.   It is OK to hold aspirin for the procedure per Dr. Tamala Julian.   I will route this recommendation to the requesting party via Epic fax function and remove from pre-op pool.  Please call with questions.  Daune Perch, NP 12/23/2018, 9:15 AM

## 2018-12-25 ENCOUNTER — Other Ambulatory Visit: Payer: Self-pay | Admitting: Interventional Cardiology

## 2019-01-18 ENCOUNTER — Other Ambulatory Visit: Payer: Self-pay | Admitting: Interventional Cardiology

## 2019-01-21 ENCOUNTER — Other Ambulatory Visit: Payer: Self-pay | Admitting: Interventional Cardiology

## 2019-03-20 IMAGING — MR MR CERVICAL SPINE WO/W CM
5 of 8 series · 25 of 48 positions shown · IV contrast (9ml multihance)
Comparison: Cervical MRI dated 10/16/2017

CLINICAL DATA: Neck pain. Radiculopathy. Previous anterior cervical
fusion at C3-4.

EXAM:
MRI CERVICAL SPINE WITHOUT AND WITH CONTRAST
TECHNIQUE: Multiplanar and multiecho pulse sequences of the cervical spine, to
include the craniocervical junction and cervicothoracic junction,
were obtained without and with intravenous contrast.
CONTRAST:  9mL MULTIHANCE GADOBENATE DIMEGLUMINE 529 MG/ML IV SOLN

[Series 3: T1 · sagittal · 3.0mm · 0.41mm/px · 4 of 15 slices shown (1 of 2)]
[im 1/15]
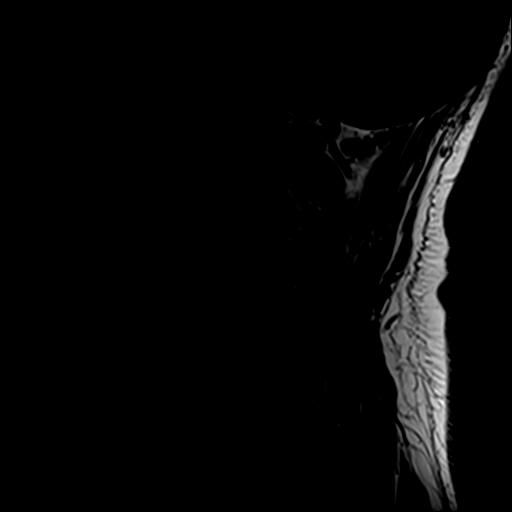
[im 5/15]
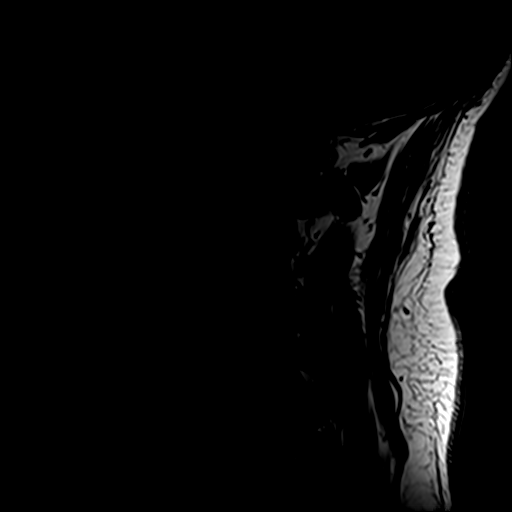
[im 10/15]
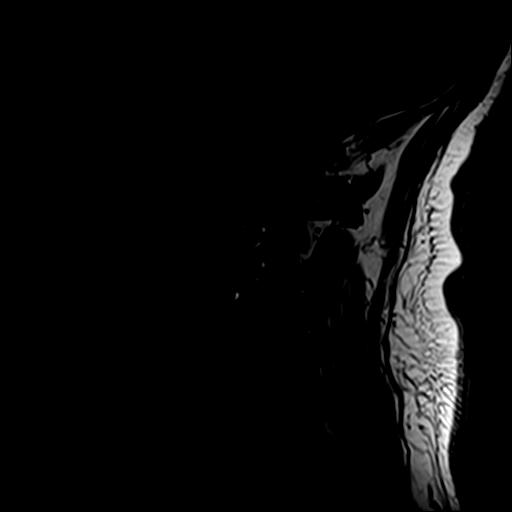
[im 15/15]
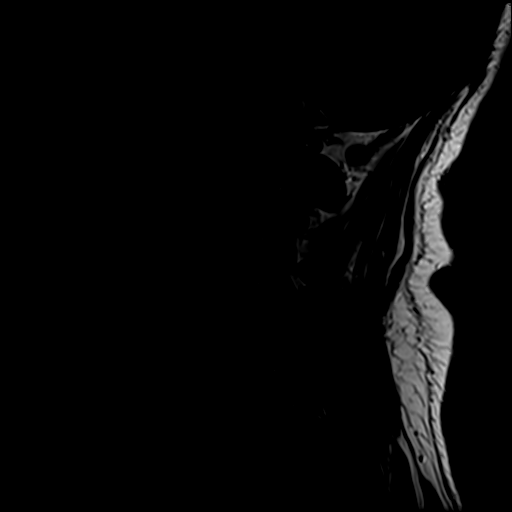

[Series 5: T2 · axial · 3.0mm · 0.70mm/px · z∈[-81,+21]mm · 8 of 28 slices shown (1 of 2)]
[im 1/28]
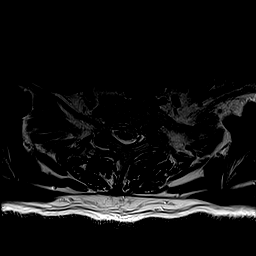
[im 4/28]
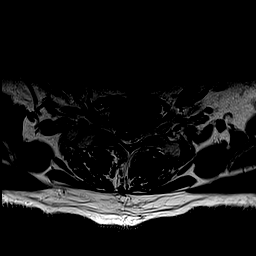
[im 8/28]
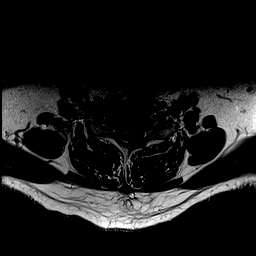
[im 12/28]
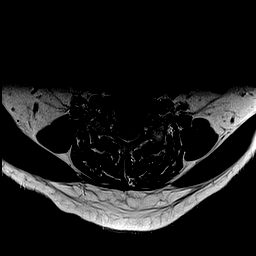
[im 16/28]
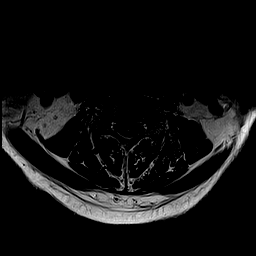
[im 20/28]
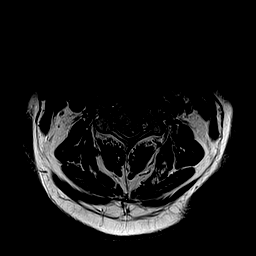
[im 24/28]
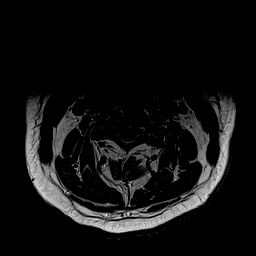
[im 28/28]
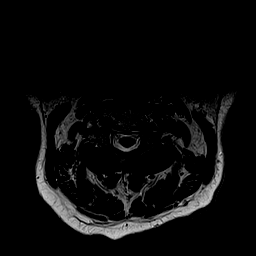

[Series 6: T1 · axial · 3.0mm · 0.35mm/px · z∈[-81,+21]mm · 8 of 28 slices shown (2 of 2)]
[im 1/28]
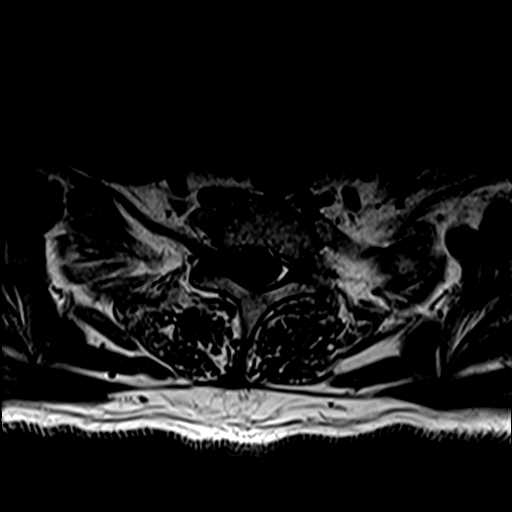
[im 4/28]
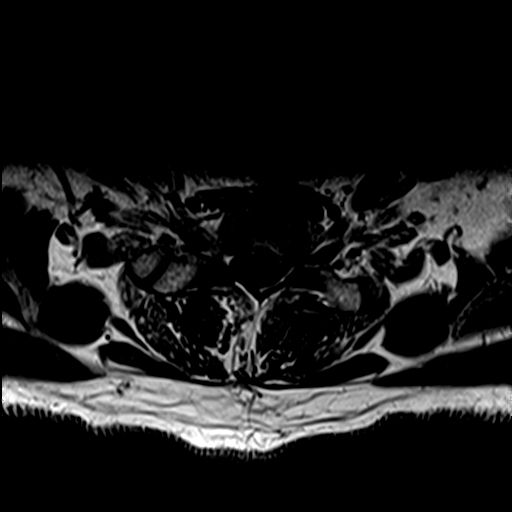
[im 8/28]
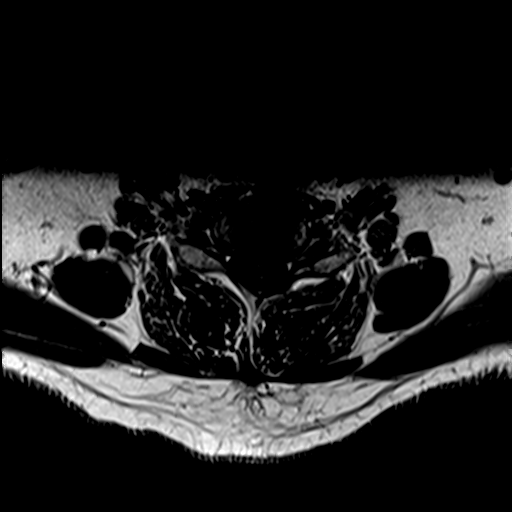
[im 12/28]
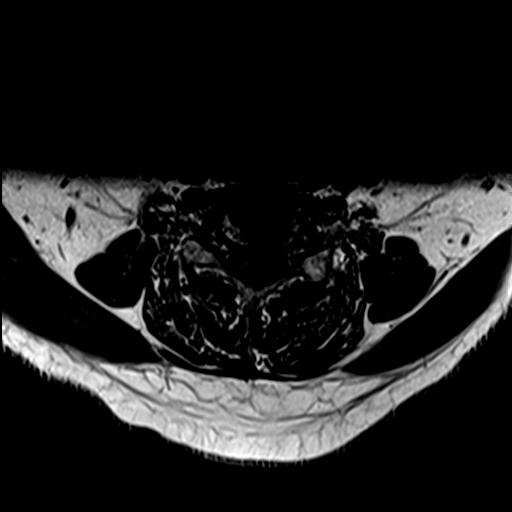
[im 16/28]
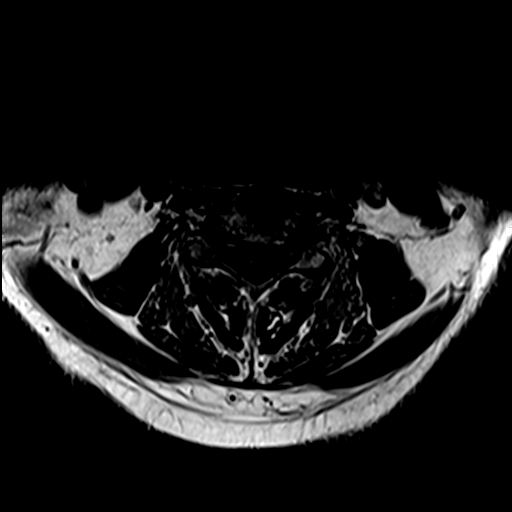
[im 20/28]
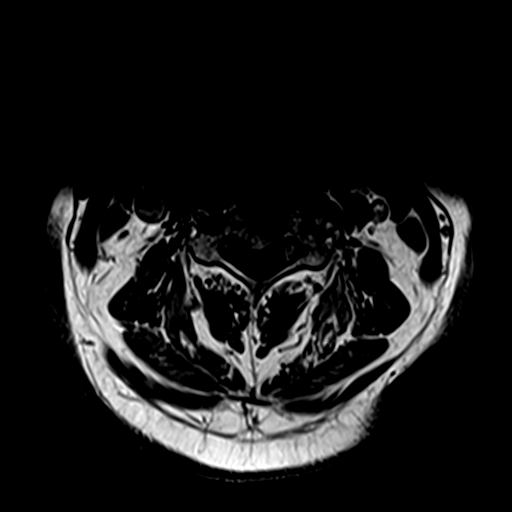
[im 24/28]
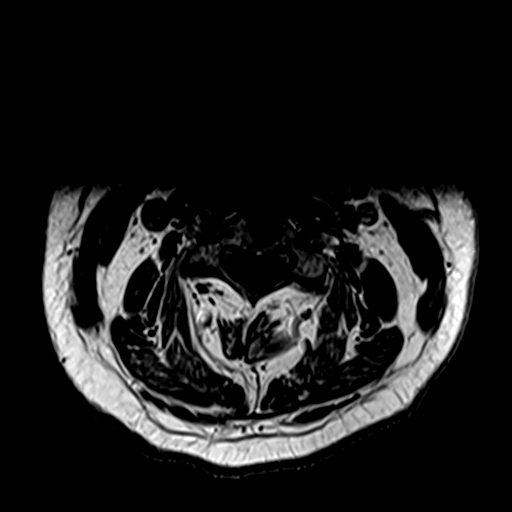
[im 28/28]
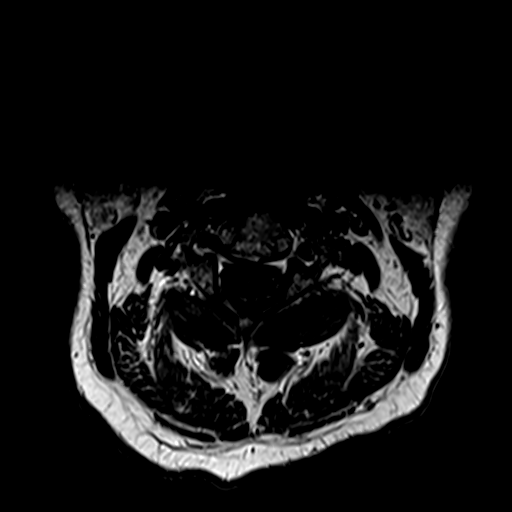

[Series 7: T1 fat-sat post-contrast · sagittal · 3.0mm · 0.82mm/px · 1 of 15 slices shown]
[im 1/15]
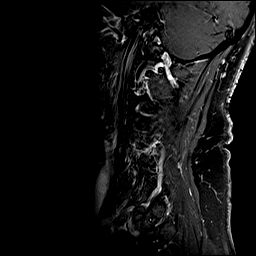

[Series 8: T2 · sagittal · 3.0mm · 0.41mm/px · 4 of 15 slices shown (2 of 2)]
[im 1/15]
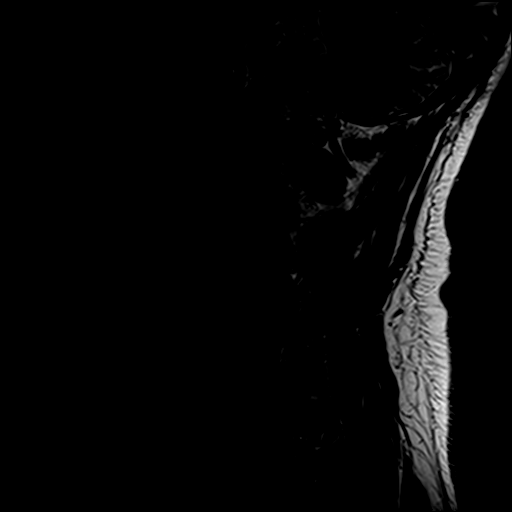
[im 5/15]
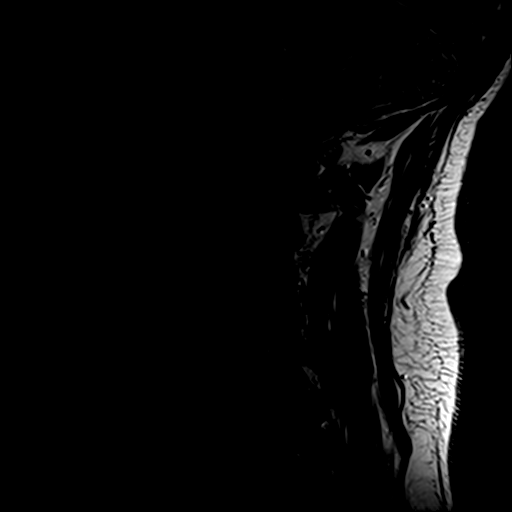
[im 10/15]
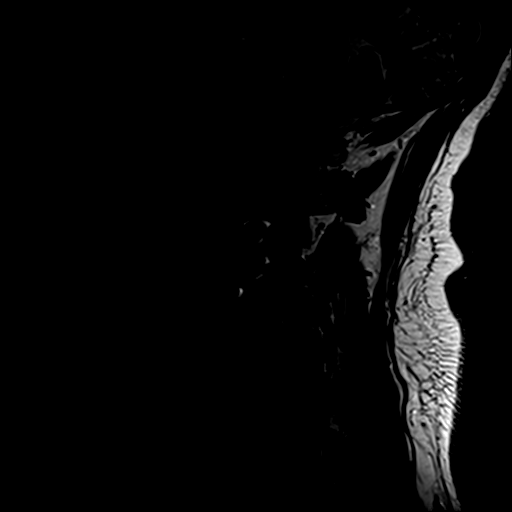
[im 15/15]
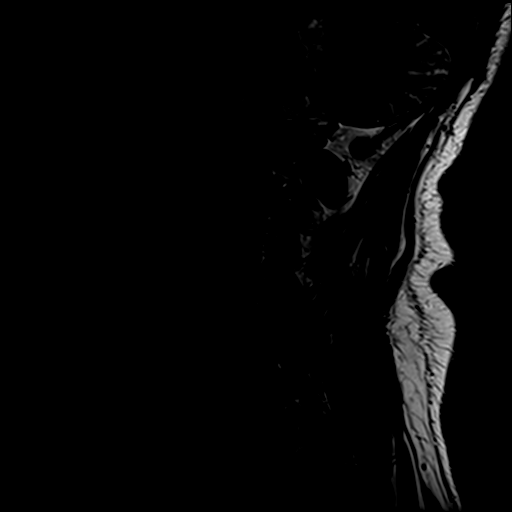

[25 of 48 positions shown; findings below may reference images not displayed]

FINDINGS: Alignment: Chronic straightening of lower cervical lordosis.

Vertebrae: Solid anterior fusion of C3 and C4.

Cord: There are small focal areas of chronic myelopathy of the
cervical spinal cord at C3-4, unchanged. No new abnormalities.

Posterior Fossa, vertebral arteries, paraspinal tissues: Negative.

Disc levels:

C2-3: No significant findings.

C3-4: Solid anterior interbody fusion. No residual neural
impingement. Widely patent neural foramina. Chronic myelopathy,
unchanged since the prior study.

C4-5: Interval slight progression of broad-based disc bulge and
small central disc protrusion with slight compression of the thecal
sac and cervical spinal cord without myelopathy. Chronic bilateral
foraminal narrowing, left greater than right, unchanged.

C5-6: Slight progression of broad-based disc bulge and central disc
protrusion with slight indentation upon the thecal sac and cervical
spinal cord without myelopathy. No significant foraminal narrowing.

C6-7: Chronic disc space narrowing. No disc bulging or protrusion.
Uncinate spurs extend into both neural foramina without foraminal
stenosis.

C7-T1: Minimal disc bulge and accompanying osteophytes just to the
right of midline without neural impingement. Widely patent neural
foramina.

After contrast administration there is no pathologic enhancement.
IMPRESSION: 1. Slight progression of disc bulges and small central disc
protrusions at C4-5 and C5-6 with slight increased indentation upon
the thecal sac and cervical spinal cord at both levels but without
myelopathy.
2. Stable myelopathy at C3-4 with no residual neural impingement.

## 2019-03-31 ENCOUNTER — Telehealth: Payer: Self-pay | Admitting: Interventional Cardiology

## 2019-03-31 DIAGNOSIS — E785 Hyperlipidemia, unspecified: Secondary | ICD-10-CM

## 2019-03-31 NOTE — Telephone Encounter (Signed)
Called pt and left message to call back.  Pt just needs to be scheduled for fasting labs (Liver, Lipid).  Orders are in. Remind pt nothing to eat or drink after midnight.

## 2019-04-05 ENCOUNTER — Other Ambulatory Visit: Payer: Self-pay | Admitting: Dermatology

## 2019-04-15 ENCOUNTER — Other Ambulatory Visit: Payer: Self-pay

## 2019-04-15 DIAGNOSIS — Z20822 Contact with and (suspected) exposure to covid-19: Secondary | ICD-10-CM

## 2019-04-15 NOTE — Telephone Encounter (Signed)
Spoke with pt and he states that he is actually going this afternoon to get tested for Covid d/t a possible exposure at work.  Advised to call me once he gets his results and we will schedule labs accordingly.  Pt verbalized understanding and was in agreement with plan.

## 2019-04-17 LAB — NOVEL CORONAVIRUS, NAA: SARS-CoV-2, NAA: NOT DETECTED

## 2019-05-06 NOTE — Telephone Encounter (Signed)
Left message to call back  

## 2019-05-28 NOTE — Telephone Encounter (Signed)
Spoke with pt and got him scheduled for labs on 11/10.  Pt verbalized understanding.

## 2019-06-15 ENCOUNTER — Other Ambulatory Visit: Payer: 59

## 2019-06-15 ENCOUNTER — Other Ambulatory Visit: Payer: Self-pay

## 2019-06-15 DIAGNOSIS — I2510 Atherosclerotic heart disease of native coronary artery without angina pectoris: Secondary | ICD-10-CM

## 2019-06-15 DIAGNOSIS — E785 Hyperlipidemia, unspecified: Secondary | ICD-10-CM

## 2019-06-15 LAB — HEPATIC FUNCTION PANEL
ALT: 21 IU/L (ref 0–44)
AST: 21 IU/L (ref 0–40)
Albumin: 4.8 g/dL (ref 3.8–4.8)
Alkaline Phosphatase: 70 IU/L (ref 39–117)
Bilirubin Total: 0.4 mg/dL (ref 0.0–1.2)
Bilirubin, Direct: 0.12 mg/dL (ref 0.00–0.40)
Total Protein: 6.9 g/dL (ref 6.0–8.5)

## 2019-06-15 LAB — LIPID PANEL
Chol/HDL Ratio: 6.7 ratio — ABNORMAL HIGH (ref 0.0–5.0)
Cholesterol, Total: 207 mg/dL — ABNORMAL HIGH (ref 100–199)
HDL: 31 mg/dL — ABNORMAL LOW (ref >39)
LDL Chol Calc (NIH): 117 mg/dL — ABNORMAL HIGH (ref 0–99)
Triglycerides: 334 mg/dL — ABNORMAL HIGH (ref 0–149)
VLDL Cholesterol Cal: 59 mg/dL — ABNORMAL HIGH (ref 5–40)

## 2019-06-17 ENCOUNTER — Telehealth: Payer: Self-pay | Admitting: *Deleted

## 2019-06-17 DIAGNOSIS — E785 Hyperlipidemia, unspecified: Secondary | ICD-10-CM

## 2019-06-17 NOTE — Telephone Encounter (Signed)
Pt has been notified of lab results by phone with verbal understanding to results. Pt does state he is taking the Lipitor 80 mg though he did honestly say in the last month he has only taken it maybe 2 weeks. He does state he could not get it for a little bit due to money problems. Pt would like to try first continuing to take the Lipitor everyday (he promises) before referral to the lipid clinic. I assured the pt that I will send my note to Dr. Thompson Caul nurse Marveen Reeks RN and she will call back with Dr. Thompson Caul recommendations. Pt thanked me for the call and the help. The patient has been notified of the result and verbalized understanding.  All questions (if any) were answered. Julaine Hua, [place] 06/17/2019 3:10 PM

## 2019-06-17 NOTE — Telephone Encounter (Signed)
Spoke with pt and scheduled him for repeat labs in about 6 weeks.  Pt agreeable to take statin as prescribed.  Pt appreciative for call.

## 2019-06-17 NOTE — Telephone Encounter (Signed)
-----   Message from Belva Crome, MD sent at 06/17/2019 12:54 PM EST ----- Let the patient know it seems he has stopped taking his medication.  LDL cholesterol is 117.  Target is less than 70.  Please let me know what is going on.  If he is still on statin therapy, compliant, please refer to the lipid clinic to consider PCSK9 addition. A copy will be sent to Maury Dus, MD

## 2019-08-02 ENCOUNTER — Other Ambulatory Visit: Payer: Self-pay

## 2019-08-02 ENCOUNTER — Other Ambulatory Visit: Payer: 59 | Admitting: *Deleted

## 2019-08-02 DIAGNOSIS — E785 Hyperlipidemia, unspecified: Secondary | ICD-10-CM

## 2019-08-02 LAB — HEPATIC FUNCTION PANEL
ALT: 21 IU/L (ref 0–44)
AST: 18 IU/L (ref 0–40)
Albumin: 4.9 g/dL — ABNORMAL HIGH (ref 3.8–4.8)
Alkaline Phosphatase: 63 IU/L (ref 39–117)
Bilirubin Total: 0.4 mg/dL (ref 0.0–1.2)
Bilirubin, Direct: 0.13 mg/dL (ref 0.00–0.40)
Total Protein: 7 g/dL (ref 6.0–8.5)

## 2019-08-02 LAB — LIPID PANEL
Chol/HDL Ratio: 5.9 ratio — ABNORMAL HIGH (ref 0.0–5.0)
Cholesterol, Total: 176 mg/dL (ref 100–199)
HDL: 30 mg/dL — ABNORMAL LOW (ref >39)
LDL Chol Calc (NIH): 100 mg/dL — ABNORMAL HIGH (ref 0–99)
Triglycerides: 271 mg/dL — ABNORMAL HIGH (ref 0–149)
VLDL Cholesterol Cal: 46 mg/dL — ABNORMAL HIGH (ref 5–40)

## 2019-08-09 ENCOUNTER — Encounter: Payer: Self-pay | Admitting: Interventional Cardiology

## 2019-08-09 NOTE — Telephone Encounter (Signed)
Error

## 2019-08-10 ENCOUNTER — Telehealth: Payer: Self-pay | Admitting: *Deleted

## 2019-08-10 DIAGNOSIS — E785 Hyperlipidemia, unspecified: Secondary | ICD-10-CM

## 2019-08-10 MED ORDER — EZETIMIBE 10 MG PO TABS: 10.0000 mg | ORAL_TABLET | Freq: Every day | ORAL | 3 refills | Status: DC

## 2019-08-10 NOTE — Telephone Encounter (Signed)
Late entry:  Spoke with pt 08/09/19 and reviewed lab results and recommendations.  Pt will come for labs 4/6.  Pt verbalized understanding and was in agreement with plan.

## 2019-08-10 NOTE — Telephone Encounter (Signed)
-----   Message from Belva Crome, MD sent at 08/08/2019  3:38 PM EST ----- Let the patient know the LDL is too high. Add Ezetimibe 10 mg daily. Lipid panel in 3 months A copy will be sent to Maury Dus, MD

## 2019-09-06 NOTE — Progress Notes (Signed)
Cardiology Office Note:    Date:  09/07/2019   ID:  Brett Clark, DOB [birth year], MRN [id number]  PCP:  Brett Dus, MD  Cardiologist:  Brett Grooms, MD   Referring MD: Brett Dus, MD   Chief Complaint  Patient presents with  . Coronary Artery Disease    History of Present Illness:    Brett Clark is a 64 y.o. male with a hx of CAD with prior anterior wall infarction requiring DES mid LAD11/2015, partial right nephrectomy for renal cell carcinoma (clear cell), hyperlipidemia, and chronic combined systolic and diastolic heart failure with last documented EF50-55%.  He is doing well.  He denies angina.  Physical activity is moderate achieving greater than 150 minutes.  No angina.  He does occasionally get cramps in his chest in feet stretches or bends in a certain direction.  We discussed the possibility that this could be statin related.  Past Medical History:  Diagnosis Date  . Abnormal abdominal CT scan    a. Abnl lesion on kidney/liver noted on CT @ [facility]) - no further details available.  Marland Kitchen CAD (coronary artery disease)    a. 06/2014 NSTEMI/PCI: LM nl, LAD 80 (2.5x18 Xience DES), LCX nl, RCA nl, EF 40-45%.  Marland Kitchen Dyslipidemia   . Headache    migraine and tension  . Ischemic cardiomyopathy    a. 06/2014 EF 40-45% by LV gram;  b. 06/2014 Echo: EF 50-55%, mild LVH, distal anterior, apical, and inferoapical wall motion abnormalities, DD, mild MR.  . Myocardial infarction ([place]) nov 2015  . Right renal mass   . Tobacco abuse     Past Surgical History:  Procedure Laterality Date  . BACK SURGERY  years ago   neck  . CARDIAC CATHETERIZATION  06/23/2014   DES to mid LAD, single v disease  . hydrocele surgery  15 years ago  . LEFT HEART CATHETERIZATION WITH CORONARY ANGIOGRAM N/A 06/23/2014   Procedure: LEFT HEART CATHETERIZATION WITH CORONARY ANGIOGRAM;  Surgeon: Brett M Martinique, MD;  Location: [facility] CATH LAB;  Service: Cardiovascular;  Laterality: N/A;  .  ROBOTIC ASSITED PARTIAL NEPHRECTOMY Right 01/18/2015   Procedure: ROBOTIC ASSITED PARTIAL NEPHRECTOMY, INTRAOPERATIVE ULTRASOUND, AND INDOCYANINE GREEN DYE;  Surgeon: Brett Frock, MD;  Location: [facility] ORS;  Service: Urology;  Laterality: Right;    Current Medications: Current Meds  Medication Sig  . aspirin EC 81 MG tablet Take 1 tablet (81 mg total) by mouth daily.  Marland Kitchen atorvastatin (LIPITOR) 80 MG tablet TAKE 1 TABLET BY MOUTH ONCE DAILY AT  6PM  . ezetimibe (ZETIA) 10 MG tablet Take 1 tablet (10 mg total) by mouth daily.  . fenofibrate 160 MG tablet Take 1 tablet by mouth once daily  . lisinopril-hydrochlorothiazide (PRINZIDE,ZESTORETIC) 10-12.5 MG tablet Take 1 tablet by mouth once daily  . meloxicam (MOBIC) 7.5 MG tablet Take 1 tablet by mouth as needed.     Allergies:   Codeine and Tramadol   Social History   Socioeconomic History  . Marital status: Married    Spouse name: Not on file  . Number of children: Not on file  . Years of education: Not on file  . Highest education level: Not on file  Occupational History  . Not on file  Tobacco Use  . Smoking status: Former Smoker    Packs/day: 1.00    Years: 20.00    Pack years: 20.00    Types: Cigarettes  . Smokeless tobacco: Never Used  . Tobacco comment:  quit smoking years ago  Substance and Sexual Activity  . Alcohol use: Yes    Comment: occasional  . Drug use: No  . Sexual activity: Not on file  Other Topics Concern  . Not on file  Social History Narrative  . Not on file   Social Determinants of Health   Financial Resource Strain:   . Difficulty of Paying Living Expenses: Not on file  Food Insecurity:   . Worried About Charity fundraiser in the Last Year: Not on file  . Ran Out of Food in the Last Year: Not on file  Transportation Needs:   . Lack of Transportation (Medical): Not on file  . Lack of Transportation (Non-Medical): Not on file  Physical Activity:   . Days of Exercise per Week: Not on file  .  Minutes of Exercise per Session: Not on file  Stress:   . Feeling of Stress : Not on file  Social Connections:   . Frequency of Communication with Friends and Family: Not on file  . Frequency of Social Gatherings with Friends and Family: Not on file  . Attends Religious Services: Not on file  . Active Member of Clubs or Organizations: Not on file  . Attends Archivist Meetings: Not on file  . Marital Status: Not on file     Family History: The patient's family history includes CAD in his father; COPD in his father.  ROS:   Please see the history of present illness.    Some depression and irritation related to the COVID-19 pandemic.  All other systems reviewed and are negative.  EKGs/Labs/Other Studies Reviewed:    The following studies were reviewed today: None  EKG:  EKG right axis deviation, incomplete right bundle branch block, findings consistent with bifascicular block with left posterior hemiblock and incomplete right bundle.  Normal sinus rhythm.  When compared to prior  Recent Labs: 08/02/2019: ALT 21  Recent Lipid Panel    Component Value Date/Time   CHOL 176 08/02/2019 0731   TRIG 271 (H) 08/02/2019 0731   HDL 30 (L) 08/02/2019 0731   CHOLHDL 5.9 (H) 08/02/2019 0731   CHOLHDL 4.1 09/20/2015 0906   VLDL 18 09/20/2015 0906   LDLCALC 100 (H) 08/02/2019 0731    Physical Exam:    VS:  BP 122/68   Pulse 80   Ht 6' 1.2" (1.859 m)   Wt 187 lb 3.2 oz (84.9 kg)   SpO2 100%   BMI 24.56 kg/m     Wt Readings from Last 3 Encounters:  09/07/19 187 lb 3.2 oz (84.9 kg)  09/11/18 190 lb 6.4 oz (86.4 kg)  10/14/17 192 lb 3.2 oz (87.2 kg)     GEN: Healthy-appearing. No acute distress HEENT: Normal NECK: No JVD. LYMPHATICS: No lymphadenopathy CARDIAC:  RRR without murmur, gallop, or edema. VASCULAR:  Normal Pulses. No bruits. RESPIRATORY:  Clear to auscultation without rales, wheezing or rhonchi  ABDOMEN: Soft, non-tender, non-distended, No pulsatile  mass, MUSCULOSKELETAL: No deformity  SKIN: Warm and dry NEUROLOGIC:  Alert and oriented x 3 PSYCHIATRIC:  Normal affect   ASSESSMENT:    1. Coronary artery disease involving native coronary artery of native heart without angina pectoris   2. Stage 3b chronic kidney disease   3. Essential hypertension   4. Dyslipidemia   5. Tobacco abuse   6. Snoring   7. Educated about COVID-19 virus infection   8. Bifascicular block    PLAN:    In order of  problems listed above:  1. Secondary prevention reviewed.  He is achieving 150 minutes of moderate activity. 2. Not assessed today. 3. Excellent control with target less than 130/80 mmHg. 4. Lipid panel done earlier this month revealed mildly elevated LDL.  Ezetimibe was added and a statin panel will be obtained later this month along with a hemoglobin A1c. 5. Not smoking 6. Sleeps well. 7. 3W's and COVID-19 vaccine discussed.  Patient endorses both. 8. Incomplete right bundle with left posterior hemiblock.  Overall education and awareness concerning primary/secondary risk prevention was discussed in detail: LDL less than 70, hemoglobin A1c less than 7, blood pressure target less than 130/80 mmHg, >150 minutes of moderate aerobic activity per week, avoidance of smoking, weight control (via diet and exercise), and continued surveillance/management of/for obstructive sleep apnea.    Medication Adjustments/Labs and Tests Ordered: Current medicines are reviewed at length with the patient today.  Concerns regarding medicines are outlined above.  Orders Placed This Encounter  Procedures  . Hemoglobin A1c  . EKG 12-Lead   No orders of the defined types were placed in this encounter.   Patient Instructions  Medication Instructions:  Your physician recommends that you continue on your current medications as directed. Please refer to the Current Medication list given to you today.  *If you need a refill on your cardiac medications before your  next appointment, please call your pharmacy*  Lab Work: A1C same day as Lipid panel in  April  If you have labs (blood work) drawn today and your tests are completely normal, you will receive your results only by: Marland Kitchen MyChart Message (if you have MyChart) OR . A paper copy in the mail If you have any lab test that is abnormal or we need to change your treatment, we will call you to review the results.  Testing/Procedures: None  Follow-Up: At [facility], you and your health needs are our priority.  As part of our continuing mission to provide you with exceptional heart care, we have created designated Provider Care Teams.  These Care Teams include your primary Cardiologist (physician) and Advanced Practice Providers (APPs -  Physician Assistants and Nurse Practitioners) who all work together to provide you with the care you need, when you need it.  Your next appointment:   12 month(s)  The format for your next appointment:   In Person  Provider:   You may see Brett Grooms, MD or one of the following Advanced Practice Providers on your designated Care Team:    Truitt Merle, NP  Cecilie Kicks, NP  Kathyrn Drown, NP   Other Instructions      Signed, Brett Grooms, MD  09/07/2019 8:27 AM    [place]

## 2019-09-07 ENCOUNTER — Other Ambulatory Visit: Payer: Self-pay

## 2019-09-07 ENCOUNTER — Ambulatory Visit (INDEPENDENT_AMBULATORY_CARE_PROVIDER_SITE_OTHER): Payer: 59 | Admitting: Interventional Cardiology

## 2019-09-07 ENCOUNTER — Encounter: Payer: Self-pay | Admitting: Interventional Cardiology

## 2019-09-07 VITALS — BP 122/68 | HR 80 | Ht 73.2 in | Wt 187.2 lb

## 2019-09-07 DIAGNOSIS — I252 Old myocardial infarction: Secondary | ICD-10-CM

## 2019-09-07 DIAGNOSIS — N1832 Chronic kidney disease, stage 3b: Secondary | ICD-10-CM | POA: Diagnosis not present

## 2019-09-07 DIAGNOSIS — Z955 Presence of coronary angioplasty implant and graft: Secondary | ICD-10-CM

## 2019-09-07 DIAGNOSIS — R0683 Snoring: Secondary | ICD-10-CM

## 2019-09-07 DIAGNOSIS — I2510 Atherosclerotic heart disease of native coronary artery without angina pectoris: Secondary | ICD-10-CM

## 2019-09-07 DIAGNOSIS — I10 Essential (primary) hypertension: Secondary | ICD-10-CM

## 2019-09-07 DIAGNOSIS — I452 Bifascicular block: Secondary | ICD-10-CM

## 2019-09-07 DIAGNOSIS — Z7189 Other specified counseling: Secondary | ICD-10-CM

## 2019-09-07 DIAGNOSIS — Z720 Tobacco use: Secondary | ICD-10-CM

## 2019-09-07 DIAGNOSIS — E785 Hyperlipidemia, unspecified: Secondary | ICD-10-CM

## 2019-09-07 DIAGNOSIS — I130 Hypertensive heart and chronic kidney disease with heart failure and stage 1 through stage 4 chronic kidney disease, or unspecified chronic kidney disease: Secondary | ICD-10-CM

## 2019-09-07 NOTE — Patient Instructions (Signed)
Medication Instructions:  Your physician recommends that you continue on your current medications as directed. Please refer to the Current Medication list given to you today.  *If you need a refill on your cardiac medications before your next appointment, please call your pharmacy*  Lab Work: A1C same day as Lipid panel in  April  If you have labs (blood work) drawn today and your tests are completely normal, you will receive your results only by: Marland Kitchen MyChart Message (if you have MyChart) OR . A paper copy in the mail If you have any lab test that is abnormal or we need to change your treatment, we will call you to review the results.  Testing/Procedures: None  Follow-Up: At [facility], you and your health needs are our priority.  As part of our continuing mission to provide you with exceptional heart care, we have created designated Provider Care Teams.  These Care Teams include your primary Cardiologist (physician) and Advanced Practice Providers (APPs -  Physician Assistants and Nurse Practitioners) who all work together to provide you with the care you need, when you need it.  Your next appointment:   12 month(s)  The format for your next appointment:   In Person  Provider:   You may see Sinclair Grooms, MD or one of the following Advanced Practice Providers on your designated Care Team:    Truitt Merle, NP  Cecilie Kicks, NP  Kathyrn Drown, NP   Other Instructions

## 2019-10-06 ENCOUNTER — Other Ambulatory Visit: Payer: Self-pay | Admitting: Dermatology

## 2019-10-30 ENCOUNTER — Other Ambulatory Visit: Payer: Self-pay | Admitting: Interventional Cardiology

## 2019-11-09 ENCOUNTER — Other Ambulatory Visit: Payer: 59

## 2020-01-14 ENCOUNTER — Other Ambulatory Visit: Payer: Self-pay | Admitting: Interventional Cardiology

## 2020-02-15 ENCOUNTER — Other Ambulatory Visit: Payer: Self-pay

## 2020-02-15 ENCOUNTER — Ambulatory Visit (INDEPENDENT_AMBULATORY_CARE_PROVIDER_SITE_OTHER): Payer: 59 | Admitting: Dermatology

## 2020-02-15 ENCOUNTER — Encounter: Payer: Self-pay | Admitting: Dermatology

## 2020-02-15 DIAGNOSIS — L570 Actinic keratosis: Secondary | ICD-10-CM | POA: Diagnosis not present

## 2020-02-15 DIAGNOSIS — D485 Neoplasm of uncertain behavior of skin: Secondary | ICD-10-CM

## 2020-02-15 DIAGNOSIS — C44622 Squamous cell carcinoma of skin of right upper limb, including shoulder: Secondary | ICD-10-CM | POA: Diagnosis not present

## 2020-02-15 DIAGNOSIS — C4492 Squamous cell carcinoma of skin, unspecified: Secondary | ICD-10-CM

## 2020-02-15 HISTORY — DX: Squamous cell carcinoma of skin, unspecified: C44.92

## 2020-02-15 NOTE — Patient Instructions (Signed)

## 2020-02-16 ENCOUNTER — Ambulatory Visit: Payer: 59 | Admitting: Dermatology

## 2020-02-18 ENCOUNTER — Telehealth: Payer: Self-pay | Admitting: Dermatology

## 2020-02-18 ENCOUNTER — Encounter: Payer: Self-pay | Admitting: Dermatology

## 2020-02-18 NOTE — Telephone Encounter (Signed)
-----   Message from Lavonna Monarch, MD sent at 02/18/2020  7:29 AM EDT ----- Schedule surgery with Dr. Darene Lamer

## 2020-02-18 NOTE — Telephone Encounter (Signed)
Patient is calling for pathology results from last visit with Stuart Tafeen, MD 

## 2020-02-18 NOTE — Telephone Encounter (Signed)
Phone call to patient with his pathology results. Patient aware of results.  

## 2020-03-06 NOTE — Progress Notes (Signed)
   Follow-Up Visit   Subjective  Brett Clark is a 64 y.o. male who presents for the following: Skin Problem (right arm x 3 months- sore and bleeds).  New growths right arm and right neck Location:  Duration:  Quality:  Associated Signs/Symptoms: Modifying Factors:  Severity:  Timing: Context: History of skin cancers  Objective  Well appearing patient in no apparent distress; mood and affect are within normal limits.  All skin waist up examined.   Assessment & Plan    Neoplasm of uncertain behavior of skin (2) Right Forearm - Posterior  Skin / nail biopsy Type of biopsy: tangential   Informed consent: discussed and consent obtained   Timeout: patient name, date of birth, surgical site, and procedure verified   Procedure prep:  Patient was prepped and draped in usual sterile fashion Prep type:  Chlorhexidine Anesthesia: the lesion was anesthetized in a standard fashion   Anesthetic:  1% lidocaine w/ epinephrine 1-100,000 local infiltration Instrument used: flexible razor blade   Hemostasis achieved with: ferric subsulfate   Outcome: patient tolerated procedure well   Post-procedure details: wound care instructions given    Specimen 1 - Surgical pathology Differential Diagnosis: bcc vs scc Check Margins: No  Right Posterior Neck  Skin / nail biopsy Type of biopsy: tangential   Informed consent: discussed and consent obtained   Timeout: patient name, date of birth, surgical site, and procedure verified   Procedure prep:  Patient was prepped and draped in usual sterile fashion Prep type:  Chlorhexidine Anesthesia: the lesion was anesthetized in a standard fashion   Anesthetic:  1% lidocaine w/ epinephrine 1-100,000 local infiltration Instrument used: flexible razor blade   Hemostasis achieved with: ferric subsulfate   Outcome: patient tolerated procedure well   Post-procedure details: wound care instructions given    Specimen 2 - Surgical  pathology Differential Diagnosis: r/o tag Check Margins: No  AK (actinic keratosis) Right Hand - Posterior  Destruction of lesion - Right Hand - Posterior Complexity: simple   Destruction method: cryotherapy   Informed consent: discussed and consent obtained   Timeout:  patient name, date of birth, surgical site, and procedure verified Lesion destroyed using liquid nitrogen: Yes   Region frozen until ice ball extended beyond lesion: Yes   Cryotherapy cycles:  5 Outcome: patient tolerated procedure well with no complications        I, Lavonna Monarch, MD, have reviewed all documentation for this visit.  The documentation on 03/06/20 for the exam, diagnosis, procedures, and orders are all accurate and complete.

## 2020-04-06 ENCOUNTER — Encounter: Payer: Self-pay | Admitting: Dermatology

## 2020-04-06 ENCOUNTER — Other Ambulatory Visit: Payer: Self-pay

## 2020-04-06 ENCOUNTER — Ambulatory Visit (INDEPENDENT_AMBULATORY_CARE_PROVIDER_SITE_OTHER): Payer: 59 | Admitting: Dermatology

## 2020-04-06 DIAGNOSIS — C44622 Squamous cell carcinoma of skin of right upper limb, including shoulder: Secondary | ICD-10-CM | POA: Diagnosis not present

## 2020-04-06 DIAGNOSIS — C4492 Squamous cell carcinoma of skin, unspecified: Secondary | ICD-10-CM

## 2020-04-06 NOTE — Progress Notes (Signed)
  Curet x3 cautery deep pocket and 5FU

## 2020-04-06 NOTE — Patient Instructions (Signed)

## 2020-05-02 ENCOUNTER — Encounter: Payer: Self-pay | Admitting: Dermatology

## 2020-05-02 NOTE — Progress Notes (Signed)
   Follow-Up Visit   Subjective  Brett Clark is a 64 y.o. male who presents for the following: Procedure (scc at right forarm).  SCCA Location: Right forearm Duration:  Quality:  Associated Signs/Symptoms: Modifying Factors:  Severity:  Timing: Context: For treatment  Objective  Well appearing patient in no apparent distress; mood and affect are within normal limits.  A focused examination was performed including Head, neck, arms.. Relevant physical exam findings are noted in the Assessment and Plan.   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 - Posterior  Destruction of lesion Complexity: simple   Destruction method: electrodesiccation and curettage   Informed consent: discussed and consent obtained   Timeout:  patient name, date of birth, surgical site, and procedure verified Anesthesia: the lesion was anesthetized in a standard fashion   Anesthetic:  1% lidocaine w/ epinephrine 1-100,000 local infiltration Curettage performed in three different directions: Yes   Curettage cycles:  3 Lesion length (cm):  1.5 Lesion width (cm):  1 Margin per side (cm):  0 Final wound size (cm):  1.5 Hemostasis achieved with:  ferric subsulfate Outcome: patient tolerated procedure well with no complications   Post-procedure details: wound care instructions given   Additional details:  Inoculated with parenteral 5% fluorouracil     I, Lavonna Monarch, MD, have reviewed all documentation for this visit.  The documentation on 05/02/20 for the exam, diagnosis, procedures, and orders are all accurate and complete.

## 2020-06-14 ENCOUNTER — Ambulatory Visit: Payer: 59 | Admitting: Dermatology

## 2020-10-25 ENCOUNTER — Encounter: Payer: Self-pay | Admitting: Physician Assistant

## 2020-10-25 ENCOUNTER — Ambulatory Visit (INDEPENDENT_AMBULATORY_CARE_PROVIDER_SITE_OTHER): Payer: 59 | Admitting: Physician Assistant

## 2020-10-25 ENCOUNTER — Other Ambulatory Visit: Payer: Self-pay

## 2020-10-25 VITALS — BP 130/80 | HR 80 | Ht 73.0 in | Wt 186.0 lb

## 2020-10-25 DIAGNOSIS — M791 Myalgia, unspecified site: Secondary | ICD-10-CM

## 2020-10-25 DIAGNOSIS — I10 Essential (primary) hypertension: Secondary | ICD-10-CM | POA: Diagnosis not present

## 2020-10-25 DIAGNOSIS — I2510 Atherosclerotic heart disease of native coronary artery without angina pectoris: Secondary | ICD-10-CM | POA: Diagnosis not present

## 2020-10-25 DIAGNOSIS — E785 Hyperlipidemia, unspecified: Secondary | ICD-10-CM | POA: Diagnosis not present

## 2020-10-25 MED ORDER — ATORVASTATIN CALCIUM 80 MG PO TABS: ORAL_TABLET | ORAL | 3 refills | Status: DC

## 2020-10-25 MED ORDER — EZETIMIBE 10 MG PO TABS: 10.0000 mg | ORAL_TABLET | Freq: Every day | ORAL | 3 refills | Status: DC

## 2020-10-25 MED ORDER — LISINOPRIL-HYDROCHLOROTHIAZIDE 10-12.5 MG PO TABS: 1.0000 | ORAL_TABLET | Freq: Every day | ORAL | 3 refills | Status: DC

## 2020-10-25 NOTE — Progress Notes (Signed)
Cardiology Office Note:    Date:  10/25/2020   ID:  Brett Clark, DOB 08-30-1955, MRN [id number]  PCP:  Maury Dus, MD  [facility] Cardiologist:  Sinclair Grooms, MD  [facility] Electrophysiologist:  None   Chief Complaint: yearly follow up   History of Present Illness:    Brett Clark is a 65 y.o. male with a hx of of CAD with prior anterior wall infarction requiring DES mid LAD11/2015, partial right nephrectomy for renal cell carcinoma (clear cell), hyperlipidemia, and chronic combined systolic and diastolic heart failure with last documented EF50-55% at MI seen for follow up.   Patient is here for follow-up.  Complains of intermittent muscle ache all over his body which gets better with stretching.  He works as a Dealer.  Only drinking 1 or 2 glasses of water per day.  Denies chest pain, shortness of breath, orthopnea, PND, palpitation, dizziness, melena or blood in the stool or urine.  Past Medical History:  Diagnosis Date  . Abnormal abdominal CT scan    a. Abnl lesion on kidney/liver noted on CT @ OSH [facility]) - no further details available.  Marland Kitchen Atypical mole 09/06/2003   Upper Lesion (moderate) (widershave)  . Atypical mole 09/06/2003   Middle Back Lesion (moderate to severe) (excision)  . Atypical mole 11/08/2003   Mid Low Back (moderate to severe) (excision)  . Atypical mole 11/08/2003   Right Lower Shoulder (moderate)  . Atypical mole 11/08/2003   Mid Upper Back (moderate)  . Atypical mole 11/08/2003   Left Mid Back (moderate)  . Atypical mole 11/08/2003   Mid Abdomen (moderate) (widershave)  . Atypical mole 08/02/2004   Mid Lower Back (moderate)  . Atypical mole 08/02/2004   Right Shoulder Sup (moderate) (widershave)  . Atypical mole 08/02/2004   Right Shoulder Inf (moderate)  . Atypical mole 08/02/2004   Right Scapula (moderate)  . Atypical mole 06/22/2007   Left Shoulder (moderate to severe) (widershave)  . Atypical mole 01/22/2005    Mid Back (slight to moderate) (widershave)  . Atypical mole 09/28/2013   Right Lower Back (moderate to severe)  . CAD (coronary artery disease)    a. 06/2014 NSTEMI/PCI: LM nl, LAD 80 (2.5x18 Xience DES), LCX nl, RCA nl, EF 40-45%.  Marland Kitchen Dyslipidemia   . Headache    migraine and tension  . Ischemic cardiomyopathy    a. 06/2014 EF 40-45% by LV gram;  b. 06/2014 Echo: EF 50-55%, mild LVH, distal anterior, apical, and inferoapical wall motion abnormalities, DD, mild MR.  . Myocardial infarction ([place]) nov 2015  . Nodular basal cell carcinoma (BCC) 10/01/2017   Right Outer Forehead (excision)  . Right renal mass   . SCCA (squamous cell carcinoma) of skin 02/15/2020   Right Forearm-Posterior (well diff) TX CURET CAUTERY 5FU  . Tobacco abuse     Past Surgical History:  Procedure Laterality Date  . BACK SURGERY  years ago   neck  . CARDIAC CATHETERIZATION  06/23/2014   DES to mid LAD, single v disease  . hydrocele surgery  15 years ago  . LEFT HEART CATHETERIZATION WITH CORONARY ANGIOGRAM N/A 06/23/2014   Procedure: LEFT HEART CATHETERIZATION WITH CORONARY ANGIOGRAM;  Surgeon: Peter M Martinique, MD;  Location: [facility] CATH LAB;  Service: Cardiovascular;  Laterality: N/A;  . ROBOTIC ASSITED PARTIAL NEPHRECTOMY Right 01/18/2015   Procedure: ROBOTIC ASSITED PARTIAL NEPHRECTOMY, INTRAOPERATIVE ULTRASOUND, AND INDOCYANINE GREEN DYE;  Surgeon: Alexis Frock, MD;  Location: [facility] ORS;  Service:  Urology;  Laterality: Right;    Current Medications: Current Meds  Medication Sig  . aspirin EC 81 MG tablet Take 1 tablet (81 mg total) by mouth daily.  . fenofibrate 160 MG tablet Take 1 tablet by mouth once daily  . [DISCONTINUED] atorvastatin (LIPITOR) 80 MG tablet TAKE 1 TABLET BY MOUTH ONCE DAILY AT  6  PM  . [DISCONTINUED] lisinopril-hydrochlorothiazide (ZESTORETIC) 10-12.5 MG tablet Take 1 tablet by mouth once daily  . [DISCONTINUED] meloxicam (MOBIC) 7.5 MG tablet Take 1 tablet by mouth as needed.      Allergies:   Codeine and Tramadol   Social History   Socioeconomic History  . Marital status: Married    Spouse name: Not on file  . Number of children: Not on file  . Years of education: Not on file  . Highest education level: Not on file  Occupational History  . Not on file  Tobacco Use  . Smoking status: Former Smoker    Packs/day: 1.00    Years: 20.00    Pack years: 20.00    Types: Cigarettes  . Smokeless tobacco: Never Used  . Tobacco comment: quit smoking years ago  Vaping Use  . Vaping Use: Never used  Substance and Sexual Activity  . Alcohol use: Yes    Comment: occasional  . Drug use: No  . Sexual activity: Not on file  Other Topics Concern  . Not on file  Social History Narrative  . Not on file   Social Determinants of Health   Financial Resource Strain: Not on file  Food Insecurity: Not on file  Transportation Needs: Not on file  Physical Activity: Not on file  Stress: Not on file  Social Connections: Not on file     Family History: The patient's family history includes CAD in his father; COPD in his father.    ROS:   Please see the history of present illness.    All other systems reviewed and are negative.   EKGs/Labs/Other Studies Reviewed:    The following studies were reviewed today:  Echo 06/2014 Study Conclusions  - Left ventricle: The cavity size was normal. Wall thickness was increased in a pattern of mild LVH. Systolic function was normal. The estimated ejection fraction was in the range of 50% to 55%. There is distal anterior, apical and inferoapical hypokinesis, suggesting LA territory ischemia or infarct. Doppler parameters are consistent with abnormal left ventricular relaxation (grade 1 diastolic dysfunction). The E/e&' ratio is between 8-15, suggesting indeterminate LV filling pressure. - Mitral valve: Mildly thickened leaflets . There was mild regurgitation. - Left atrium: The atrium was normal in  size.  Impressions:  - LVEF 50-55%, distal anterior, apical and inferoapical wall motion abnormality, consistent with LAD territory ischemia/infarct, diastolic dysfunction, likely elevated LV filling pressures, mild MR.  Coronary angiography: 06/2014 Coronary dominance: right  Left mainstem: Normal  Left anterior descending (LAD): The mid LAD has an eccentric 80% lesion with some hypodensity. The first and second diagonal branches are normal.  Left circumflex (LCx): Normal  Right coronary artery (RCA): Normal.  Left ventriculography: Left ventricular systolic function is abnormal, there is akinesis of the anteroapical and inferoapical wall segments. LVEF is estimated at 40-45%, there is no significant mitral regurgitation   PCI Data: Vessel - LAD/Segment - mid Percent Stenosis (pre) 80% TIMI-flow 3 Stent 2.5 x 18 mm Xience [place] Percent Stenosis (post) 0% TIMI-flow (post) 3  Final Conclusions:  1. Single vessel obstructive CAD 2. Mild to moderate LV  dysfunction 3. Successful stenting of the mid LAD with DES   Recommendations:  DAPT for one year. Risk factor modification. Anticipate DC in am.  EKG:  EKG is ordered today.  The ekg ordered today demonstrates normal sinus rhythm, right bundle branch block  Recent Labs: No results found for requested labs within last 8760 hours.  Recent Lipid Panel    Component Value Date/Time   CHOL 176 08/02/2019 0731   TRIG 271 (H) 08/02/2019 0731   HDL 30 (L) 08/02/2019 0731   CHOLHDL 5.9 (H) 08/02/2019 0731   CHOLHDL 4.1 09/20/2015 0906   VLDL 18 09/20/2015 0906   LDLCALC 100 (H) 08/02/2019 0731   Physical Exam:    VS:  BP 130/80   Pulse 80   Ht 6\' 1"  (1.854 m)   Wt 186 lb (84.4 kg)   SpO2 98%   BMI 24.54 kg/m     Wt Readings from Last 3 Encounters:  10/25/20 186 lb (84.4 kg)  09/07/19 187 lb 3.2 oz (84.9 kg)  09/11/18 190 lb 6.4 oz (86.4 kg)     GEN: Well nourished, well developed in no acute  distress HEENT: Normal NECK: No JVD; No carotid bruits LYMPHATICS: No lymphadenopathy CARDIAC: RRR, no murmurs, rubs, gallops RESPIRATORY:  Clear to auscultation without rales, wheezing or rhonchi  ABDOMEN: Soft, non-tender, non-distended MUSCULOSKELETAL:  No edema; No deformity  SKIN: Warm and dry NEUROLOGIC:  Alert and oriented x 3 PSYCHIATRIC:  Normal affect   ASSESSMENT AND PLAN:    1. CAD s/p DES to LAD No angina.  Continue aspirin and statin.  2.  HTN Blood pressure stable and controlled on current medication.  3. HLD -Patient will come for blood work next week.  He is nonfasting today. -No change in therapy today.  4. Chronic combined CHF - LVEF of 50-55% in 06/2014 -Euvolemic -Continue lisinopril/hydrochlorothiazide  5.  Muscle ache -Does not suspect side effect of statin -Advised to drink plenty of water - He may try cutting back on his Lipitor for few days to see response  Medication Adjustments/Labs and Tests Ordered: Current medicines are reviewed at length with the patient today.  Concerns regarding medicines are outlined above.  Orders Placed This Encounter  Procedures  . Lipid panel  . Hepatic function panel  . EKG 12-Lead   Meds ordered this encounter  Medications  . lisinopril-hydrochlorothiazide (ZESTORETIC) 10-12.5 MG tablet    Sig: Take 1 tablet by mouth daily.    Dispense:  90 tablet    Refill:  3  . ezetimibe (ZETIA) 10 MG tablet    Sig: Take 1 tablet (10 mg total) by mouth daily.    Dispense:  90 tablet    Refill:  3  . atorvastatin (LIPITOR) 80 MG tablet    Sig: TAKE 1 TABLET BY MOUTH ONCE DAILY AT  6  PM    Dispense:  90 tablet    Refill:  3    Patient Instructions  Medication Instructions:  Your physician recommends that you continue on your current medications as directed. Please refer to the Current Medication list given to you today.  *If you need a refill on your cardiac medications before your next appointment, please call  your pharmacy*   Lab Work: 10/26/2019 come back for FASTING, anytime after 7:30 for:  LIPID & LFT   If you have labs (blood work) drawn today and your tests are completely normal, you will receive your results only by: Marland Kitchen MyChart Message (if you have  MyChart) OR . A paper copy in the mail If you have any lab test that is abnormal or we need to change your treatment, we will call you to review the results.   Testing/Procedures: None ordered   Follow-Up: At [facility], you and your health needs are our priority.  As part of our continuing mission to provide you with exceptional heart care, we have created designated Provider Care Teams.  These Care Teams include your primary Cardiologist (physician) and Advanced Practice Providers (APPs -  Physician Assistants and Nurse Practitioners) who all work together to provide you with the care you need, when you need it.  We recommend signing up for the patient portal called "MyChart".  Sign up information is provided on this After Visit Summary.  MyChart is used to connect with patients for Virtual Visits (Telemedicine).  Patients are able to view lab/test results, encounter notes, upcoming appointments, etc.  Non-urgent messages can be sent to your provider as well.   To learn more about what you can do with MyChart, go to [email or web address].    Your next appointment:   12 month(s)  The format for your next appointment:   In Person  Provider:   You may see Sinclair Grooms, MD or one of the following Advanced Practice Providers on your designated Care Team:    Kathyrn Drown, NP    Other Instructions      Signed, Leanor Kail, [place]  10/25/2020 9:01 AM    [place]

## 2020-10-25 NOTE — Patient Instructions (Addendum)
Medication Instructions:  Your physician recommends that you continue on your current medications as directed. Please refer to the Current Medication list given to you today.  *If you need a refill on your cardiac medications before your next appointment, please call your pharmacy*   Lab Work: 10/26/2019 come back for FASTING, anytime after 7:30 for:  LIPID & LFT   If you have labs (blood work) drawn today and your tests are completely normal, you will receive your results only by:  [place] (if you have MyChart) OR  A paper copy in the mail If you have any lab test that is abnormal or we need to change your treatment, we will call you to review the results.   Testing/Procedures: None ordered   Follow-Up: At [facility], you and your health needs are our priority.  As part of our continuing mission to provide you with exceptional heart care, we have created designated Provider Care Teams.  These Care Teams include your primary Cardiologist (physician) and Advanced Practice Providers (APPs -  Physician Assistants and Nurse Practitioners) who all work together to provide you with the care you need, when you need it.  We recommend signing up for the patient portal called "MyChart".  Sign up information is provided on this After Visit Summary.  MyChart is used to connect with patients for Virtual Visits (Telemedicine).  Patients are able to view lab/test results, encounter notes, upcoming appointments, etc.  Non-urgent messages can be sent to your provider as well.   To learn more about what you can do with MyChart, go to [email or web address].    Your next appointment:   12 month(s)  The format for your next appointment:   In Person  Provider:   You may see Sinclair Grooms, MD or one of the following Advanced Practice Providers on your designated Care Team:    Kathyrn Drown, NP    Other Instructions

## 2020-11-01 ENCOUNTER — Other Ambulatory Visit: Payer: 59 | Admitting: *Deleted

## 2020-11-01 ENCOUNTER — Other Ambulatory Visit: Payer: Self-pay

## 2020-11-01 DIAGNOSIS — E785 Hyperlipidemia, unspecified: Secondary | ICD-10-CM

## 2020-11-01 DIAGNOSIS — I2510 Atherosclerotic heart disease of native coronary artery without angina pectoris: Secondary | ICD-10-CM

## 2020-11-01 DIAGNOSIS — I10 Essential (primary) hypertension: Secondary | ICD-10-CM

## 2020-11-01 LAB — LIPID PANEL
Chol/HDL Ratio: 6.2 ratio — ABNORMAL HIGH (ref 0.0–5.0)
Cholesterol, Total: 173 mg/dL (ref 100–199)
HDL: 28 mg/dL — ABNORMAL LOW (ref >39)
LDL Chol Calc (NIH): 97 mg/dL (ref 0–99)
Triglycerides: 282 mg/dL — ABNORMAL HIGH (ref 0–149)
VLDL Cholesterol Cal: 48 mg/dL — ABNORMAL HIGH (ref 5–40)

## 2020-11-01 LAB — HEPATIC FUNCTION PANEL
ALT: 33 IU/L (ref 0–44)
AST: 29 IU/L (ref 0–40)
Albumin: 4.7 g/dL (ref 3.8–4.8)
Alkaline Phosphatase: 59 IU/L (ref 44–121)
Bilirubin Total: 0.4 mg/dL (ref 0.0–1.2)
Bilirubin, Direct: 0.14 mg/dL (ref 0.00–0.40)
Total Protein: 7 g/dL (ref 6.0–8.5)

## 2020-11-03 ENCOUNTER — Telehealth: Payer: Self-pay | Admitting: *Deleted

## 2020-11-03 DIAGNOSIS — E785 Hyperlipidemia, unspecified: Secondary | ICD-10-CM

## 2020-11-03 NOTE — Telephone Encounter (Signed)
Pt has been made aware of his lab results. He will like to be referred to Lipid Clinic for further treatment.

## 2020-11-03 NOTE — Telephone Encounter (Signed)
-----   Message from [place], [place] sent at 11/03/2020  9:24 AM EDT ----- Please add Vecepa 2gm BID and recheck labs in 8 weeks. Other option to refer to lipid clinic if patient wants to seen if patient agrees.   ----- Message ----- From: Jeanann Lewandowsky, RMA Sent: 11/03/2020   8:44 AM EDT To: Leanor Kail, PA  Pt made aware of his lab results. He states he is taking his medications and he is back on a routine now, but has never gone more than a couple days without them.

## 2020-11-09 ENCOUNTER — Telehealth: Payer: Self-pay | Admitting: *Deleted

## 2020-11-09 NOTE — Telephone Encounter (Signed)
Patient left a msg on the refill vm requesting a call back in regards to his meds being changed, he would like to know why they were changed. He can be reached at [phone]. Thanks, MI

## 2020-11-09 NOTE — Telephone Encounter (Signed)
Spoke with pt and he wanted to know why his Fenofibrate was being changed.  Advised pt this was a recommendation from [employer], PA-C.  Asked about the way he was taking Fenofibrate because Vin mentions that he wasn't taking it correctly.  Pt admits to missing probably 50% of his doses.  Pt now taking everyday like he is suppose to. Has appt with Lipid Clinic on 4/25.  Advised pt to continue taking Fenofibrate as prescribed and talk to PharmD at his appt and we'll go from there on if meds need to be changed.  Pt agreeable to plan.

## 2020-11-27 ENCOUNTER — Other Ambulatory Visit: Payer: Self-pay

## 2020-11-27 ENCOUNTER — Ambulatory Visit (INDEPENDENT_AMBULATORY_CARE_PROVIDER_SITE_OTHER): Payer: 59 | Admitting: Pharmacist

## 2020-11-27 DIAGNOSIS — E785 Hyperlipidemia, unspecified: Secondary | ICD-10-CM

## 2020-11-27 MED ORDER — FENOFIBRATE 160 MG PO TABS: 160.0000 mg | ORAL_TABLET | Freq: Every day | ORAL | 3 refills | Status: DC

## 2020-11-27 NOTE — Patient Instructions (Signed)
It was good to see you today. Continue to keep up the good work and take your medications daily. We will follow up with labs to check your triglycerides and LDL. Please call and let us know when would be best for you in the next two weeks.   Call if you have any questions or need refills.   [facility] [address], [place], [place] [address]  Phone: [phone]; Fax: [phone]  (Ask for [facility])

## 2020-11-27 NOTE — Progress Notes (Signed)
Patient ID: Brett Clark                 DOB: 1956/05/27                    MRN: [id number]     HPI: Brett Clark is a 65 y.o. male patient referred to lipid clinic by Robbie Lis, PA. PMH is significant for CAD with prior anterior wall infarction requiring DES mid LAD11/2015, partial right nephrectomy for renal cell carcinoma (clear cell), hyperlipidemia, and chronic combined systolic and diastolic heart failure with last documented EF40-45%.  Patient presents today with no complaints, he is tolerating his medications well. Has documented muscle aches but it is musculoskeletal in nature, resolves with stretches. He was taking his medications 50% of the time before previous labs were drawn in March. Since then he has taken them every day in an effort to get his cholesterol at goal and is using his pillbox. We have no new labs since then. He remains on his current insurance with [place]. He is not fasting this morning. Fenofibrate has been sent in as a 90 day supply to help with compliance.  We discussed the role of PCSK9i, how to administer injections and the side effect profile with skin reactions and a low risk of myalgias. He administered injections to his father and is comfortable injecting the autoinjector if that is required.   Current Medications:  fenofibrate 160mg  once daily (since 2015) atorvastatin 80mg  once daily (since 2015) ezetimibe 10mg  once daily (since 2021)  Intolerances: none Risk Factors: CAD, HLD, HTN,  LDL goal: <70  Diet:  Breakfast: does not eat Lunch: meat and vegetables meal at [employer] ` Dinner: vegetables Snacks: peanut butter crackers  Exercise: Works outside and fishes  Family History: The patient's family history includes CAD in his father; COPD in his father.    Labs: Lipid Panel     Component Value Date/Time   CHOL 173 11/01/2020 0730   TRIG 282 (H) 11/01/2020 0730   HDL 28 (L) 11/01/2020 0730   CHOLHDL 6.2 (H) 11/01/2020 0730    CHOLHDL 4.1 09/20/2015 0906   VLDL 18 09/20/2015 0906   LDLCALC 97 11/01/2020 0730   LABVLDL 48 (H) 11/01/2020 0730     Past Medical History:  Diagnosis Date  . Abnormal abdominal CT scan    a. Abnl lesion on kidney/liver noted on CT @ OSH [facility]) - no further details available.  Marland Kitchen Atypical mole 09/06/2003   Upper Lesion (moderate) (widershave)  . Atypical mole 09/06/2003   Middle Back Lesion (moderate to severe) (excision)  . Atypical mole 11/08/2003   Mid Low Back (moderate to severe) (excision)  . Atypical mole 11/08/2003   Right Lower Shoulder (moderate)  . Atypical mole 11/08/2003   Mid Upper Back (moderate)  . Atypical mole 11/08/2003   Left Mid Back (moderate)  . Atypical mole 11/08/2003   Mid Abdomen (moderate) (widershave)  . Atypical mole 08/02/2004   Mid Lower Back (moderate)  . Atypical mole 08/02/2004   Right Shoulder Sup (moderate) (widershave)  . Atypical mole 08/02/2004   Right Shoulder Inf (moderate)  . Atypical mole 08/02/2004   Right Scapula (moderate)  . Atypical mole 06/22/2007   Left Shoulder (moderate to severe) (widershave)  . Atypical mole 01/22/2005   Mid Back (slight to moderate) (widershave)  . Atypical mole 09/28/2013   Right Lower Back (moderate to severe)  . CAD (coronary artery disease)    a. 06/2014  NSTEMI/PCI: LM nl, LAD 80 (2.5x18 Xience DES), LCX nl, RCA nl, EF 40-45%.  Marland Kitchen Dyslipidemia   . Headache    migraine and tension  . Ischemic cardiomyopathy    a. 06/2014 EF 40-45% by LV gram;  b. 06/2014 Echo: EF 50-55%, mild LVH, distal anterior, apical, and inferoapical wall motion abnormalities, DD, mild MR.  . Myocardial infarction ([place]) nov 2015  . Nodular basal cell carcinoma (BCC) 10/01/2017   Right Outer Forehead (excision)  . Right renal mass   . SCCA (squamous cell carcinoma) of skin 02/15/2020   Right Forearm-Posterior (well diff) TX CURET CAUTERY 5FU  . Tobacco abuse     Current Outpatient Medications on File Prior to  Visit  Medication Sig Dispense Refill  . aspirin EC 81 MG tablet Take 1 tablet (81 mg total) by mouth daily.    Marland Kitchen atorvastatin (LIPITOR) 80 MG tablet TAKE 1 TABLET BY MOUTH ONCE DAILY AT  6  PM 90 tablet 3  . ezetimibe (ZETIA) 10 MG tablet Take 1 tablet (10 mg total) by mouth daily. 90 tablet 3  . fenofibrate 160 MG tablet Take 1 tablet by mouth once daily 30 tablet 8  . lisinopril-hydrochlorothiazide (ZESTORETIC) 10-12.5 MG tablet Take 1 tablet by mouth daily. 90 tablet 3   No current facility-administered medications on file prior to visit.    Allergies  Allergen Reactions  . Codeine     headaches  . Tramadol     Assessment/Plan:  1. Hyperlipidemia -  Patient is on atorvastatin 80mg  daily and ezetimibe 10mg  daily for lipid lowering as well as fenofibrate 160mg  daily for triglycerides. He has been on each of these medications for more than one year. In March, LDL was above goal at 97 (goal <70), and triglycerides were above goal at 282 (goal <150) while adherent to medications 50% of the time due to difficulty refilling prescriptions. His refill needs have been addressed and he has a good potential for adherence. He has now been 100% adherent for one month. Will recheck labs in ~2 weeks (not fasting today), and will plan to start Repatha or Vascepa if lipids remain uncontrolled.   Thank you,  Norina Buzzard, PharmD PGY1 Pharmacy Resident 11/27/2020 7:55 AM  [facility]  [address], [place], [place] [address]  Phone: [phone]; Fax: [phone]

## 2020-12-04 ENCOUNTER — Telehealth: Payer: Self-pay | Admitting: Pharmacist

## 2020-12-04 NOTE — Telephone Encounter (Signed)
Pt was to call clinic to let us know what date he can have lipids checked based on his availability. Appt still has not been scheduled. Called pt and left message.

## 2020-12-05 NOTE — Telephone Encounter (Signed)
Left another message.

## 2020-12-08 NOTE — Telephone Encounter (Signed)
3rd call made to pt. States work has been busy. Scheduled fasting labs on 5/10. Plan to add Repatha or Vascepa pending lipid panel results.

## 2020-12-12 ENCOUNTER — Other Ambulatory Visit: Payer: 59 | Admitting: *Deleted

## 2020-12-12 ENCOUNTER — Other Ambulatory Visit: Payer: Self-pay

## 2020-12-12 ENCOUNTER — Encounter: Payer: Self-pay | Admitting: Interventional Cardiology

## 2020-12-12 ENCOUNTER — Other Ambulatory Visit: Payer: Self-pay | Admitting: Interventional Cardiology

## 2020-12-14 ENCOUNTER — Telehealth: Payer: Self-pay | Admitting: Pharmacist

## 2020-12-14 ENCOUNTER — Telehealth: Payer: Self-pay | Admitting: Interventional Cardiology

## 2020-12-14 LAB — LIPID PANEL
Chol/HDL Ratio: 4.8 ratio (ref 0.0–5.0)
Cholesterol, Total: 119 mg/dL (ref 100–199)
HDL: 25 mg/dL — ABNORMAL LOW (ref >39)
LDL Chol Calc (NIH): 60 mg/dL (ref 0–99)
Triglycerides: 208 mg/dL — ABNORMAL HIGH (ref 0–149)
VLDL Cholesterol Cal: 34 mg/dL (ref 5–40)

## 2020-12-14 LAB — HEPATIC FUNCTION PANEL
ALT: 32 IU/L (ref 0–44)
AST: 21 IU/L (ref 0–40)
Albumin: 4.9 g/dL — ABNORMAL HIGH (ref 3.8–4.8)
Alkaline Phosphatase: 56 IU/L (ref 44–121)
Bilirubin Total: 0.5 mg/dL (ref 0.0–1.2)
Bilirubin, Direct: 0.16 mg/dL (ref 0.00–0.40)
Total Protein: 7.1 g/dL (ref 6.0–8.5)

## 2020-12-14 NOTE — Telephone Encounter (Signed)
Called pt back, left voicemail.

## 2020-12-14 NOTE — Telephone Encounter (Signed)
Left message for pt to discuss lipid results.  Labs checked 5/10 at [facility], didn't pull into Epic for some reason but had them printed from [facility]:  TC 119, TG 208, HDL 25, LDL 60 AST 21, ALT 32, alk phos 56, Tbili 0.5, albumin 4.9, total protein 7.1  LDL is at goal < 70, he should continue on atorvastatin 56m daily and ezetimibe 135mdaily and does not need additional therapy. TG improved from 282 in March, still a bit elevated at 208. Will discuss adding on Vascepa for TG lowering and CV benefit when pt returns call.

## 2020-12-14 NOTE — Telephone Encounter (Signed)
Follow up:    Patient returning your call back for results.

## 2020-12-20 NOTE — Telephone Encounter (Signed)
Spoke with pt about results. He would be agreeable to starting Vascepa if the cost is affordable. Will submit PA to see if his insurance will cover brand name Vascepa so that we can use copay card.

## 2020-12-21 MED ORDER — ICOSAPENT ETHYL 1 G PO CAPS: 2.0000 g | ORAL_CAPSULE | Freq: Two times a day (BID) | ORAL | 11 refills | Status: DC

## 2020-12-21 MED ORDER — VASCEPA 1 G PO CAPS: 2.0000 g | ORAL_CAPSULE | Freq: Two times a day (BID) | ORAL | 3 refills | Status: DC

## 2020-12-21 NOTE — Addendum Note (Signed)
Addended by: Mashonda Broski E on: 12/21/2020 09:56 AM   Modules accepted: Orders

## 2020-12-21 NOTE — Telephone Encounter (Addendum)
Vascepa prior authorization approved. Rx sent to pharmacy along with $9/3 month copay card. However, pharmacy stated that his insurance does not pay much towards Vascepa and his copay is $300 for a 1 month supply. After adding in the copay card, it drops the cost to $150 due to copay card cap. When rx is run as generic, copay is $15 for 1 month supply. Advised pharmacy to fill rx as generic and sent over new rx.  Left message for pt to make him aware.

## 2020-12-26 NOTE — Telephone Encounter (Signed)
Called pt, he has not picked up icosapent ethyl yet but plans to today.

## 2020-12-29 ENCOUNTER — Other Ambulatory Visit: Payer: Self-pay | Admitting: Interventional Cardiology

## 2021-01-30 ENCOUNTER — Other Ambulatory Visit: Payer: Self-pay | Admitting: Interventional Cardiology

## 2021-02-14 ENCOUNTER — Encounter: Payer: Self-pay | Admitting: Dermatology

## 2021-02-14 ENCOUNTER — Other Ambulatory Visit: Payer: Self-pay

## 2021-02-14 ENCOUNTER — Ambulatory Visit (INDEPENDENT_AMBULATORY_CARE_PROVIDER_SITE_OTHER): Payer: 59 | Admitting: Dermatology

## 2021-02-14 DIAGNOSIS — L739 Follicular disorder, unspecified: Secondary | ICD-10-CM

## 2021-02-14 DIAGNOSIS — D485 Neoplasm of uncertain behavior of skin: Secondary | ICD-10-CM

## 2021-02-14 DIAGNOSIS — Z86018 Personal history of other benign neoplasm: Secondary | ICD-10-CM | POA: Diagnosis not present

## 2021-02-14 DIAGNOSIS — Z87898 Personal history of other specified conditions: Secondary | ICD-10-CM

## 2021-02-14 DIAGNOSIS — Z85828 Personal history of other malignant neoplasm of skin: Secondary | ICD-10-CM | POA: Diagnosis not present

## 2021-02-14 DIAGNOSIS — L821 Other seborrheic keratosis: Secondary | ICD-10-CM

## 2021-02-14 DIAGNOSIS — Z8589 Personal history of malignant neoplasm of other organs and systems: Secondary | ICD-10-CM

## 2021-02-14 DIAGNOSIS — Z1283 Encounter for screening for malignant neoplasm of skin: Secondary | ICD-10-CM

## 2021-02-14 DIAGNOSIS — L918 Other hypertrophic disorders of the skin: Secondary | ICD-10-CM

## 2021-02-14 NOTE — Patient Instructions (Signed)

## 2021-02-25 ENCOUNTER — Encounter: Payer: Self-pay | Admitting: Dermatology

## 2021-02-25 NOTE — Progress Notes (Signed)
   Follow-Up Visit   Subjective  Brett Clark is a 65 y.o. male who presents for the following: Annual Exam (No new concerns history of atypical moles, bcc ,scc).  Annual skin examination patient with a history of atypical moles and multiple nonmelanoma skin cancers. Location:  Duration:  Quality:  Associated Signs/Symptoms: Modifying Factors:  Severity:  Timing: Context:   Objective  Well appearing patient in no apparent distress; mood and affect are within normal limits. General skin examination, no atypical pigmented lesions.  1 possible new nonmelanoma skin cancer left posterior neck will be biopsied.  Mid Back No recurrence  Mid Back No recurrence or repigmentation  Mid Back No recurrence  Right Lower Back Brown textured 6 mm flattopped papule  Left Anterior Neck Pedunculated 1 mm papule  Left Posterior Neck Pearly 6 mm pink papule, rule out BCC       A full examination was performed including scalp, head, eyes, ears, nose, lips, neck, chest, axillae, abdomen, back, buttocks, bilateral upper extremities, bilateral lower extremities, hands, feet, fingers, toes, fingernails, and toenails. All findings within normal limits unless otherwise noted below.   Assessment & Plan    Screening exam for skin cancer Left Inguinal Area  History of squamous cell carcinoma Mid Back  Check as needed change  History of atypical nevus Mid Back  Annual skin check  History of basal cell carcinoma (BCC) Mid Back  Check as needed change  Seborrheic keratosis Right Lower Back  Leave if stable  Skin tag Left Anterior Neck  May remove in future if patient chooses  Neoplasm of uncertain behavior of skin Left Posterior Neck  Skin / nail biopsy Type of biopsy: tangential   Informed consent: discussed and consent obtained   Timeout: patient name, date of birth, surgical site, and procedure verified   Anesthesia: the lesion was anesthetized in a standard  fashion   Anesthetic:  1% lidocaine w/ epinephrine 1-100,000 local infiltration Instrument used: flexible razor blade   Hemostasis achieved with: aluminum chloride and electrodesiccation   Outcome: patient tolerated procedure well   Post-procedure details: wound care instructions given    Destruction of lesion Complexity: simple   Destruction method: electrodesiccation and curettage   Informed consent: discussed and consent obtained   Timeout:  patient name, date of birth, surgical site, and procedure verified Anesthesia: the lesion was anesthetized in a standard fashion   Anesthetic:  1% lidocaine w/ epinephrine 1-100,000 local infiltration Curettage performed in three different directions: Yes   Curettage cycles:  3 Margin per side (cm):  0.1 Final wound size (cm):  0.5 Hemostasis achieved with:  aluminum chloride Outcome: patient tolerated procedure well with no complications   Post-procedure details: wound care instructions given    Specimen 1 - Surgical pathology Differential Diagnosis: bcc scc curet and cautery  Check Margins: No  Encounter for screening for malignant neoplasm of skin  Annual skin examination.  Encouraged to self examine twice annually.      I, Lavonna Monarch, MD, have reviewed all documentation for this visit.  The documentation on 02/25/21 for the exam, diagnosis, procedures, and orders are all accurate and complete.

## 2021-06-04 ENCOUNTER — Other Ambulatory Visit: Payer: Self-pay

## 2021-06-04 ENCOUNTER — Ambulatory Visit (INDEPENDENT_AMBULATORY_CARE_PROVIDER_SITE_OTHER): Payer: 59 | Admitting: Dermatology

## 2021-06-04 DIAGNOSIS — L821 Other seborrheic keratosis: Secondary | ICD-10-CM | POA: Diagnosis not present

## 2021-06-04 DIAGNOSIS — D485 Neoplasm of uncertain behavior of skin: Secondary | ICD-10-CM

## 2021-06-04 DIAGNOSIS — L820 Inflamed seborrheic keratosis: Secondary | ICD-10-CM

## 2021-06-04 DIAGNOSIS — Z85828 Personal history of other malignant neoplasm of skin: Secondary | ICD-10-CM | POA: Diagnosis not present

## 2021-06-04 DIAGNOSIS — D225 Melanocytic nevi of trunk: Secondary | ICD-10-CM

## 2021-06-04 NOTE — Patient Instructions (Signed)

## 2021-06-11 ENCOUNTER — Telehealth: Payer: Self-pay

## 2021-06-11 NOTE — Telephone Encounter (Signed)
-----   Message from Lavonna Monarch, MD sent at 06/08/2021  5:34 AM EDT ----- Schedule routine follow-up visit at which time we will do a wider deeper shave on atypical mole on back

## 2021-06-11 NOTE — Telephone Encounter (Signed)
Left message for patient to call ov for wdisershave 1. Skin , mid back DYSPLASTIC NEVUS WITH MODERATE TO SEVERE ATYPIA, PERIPHERAL MARGIN INVOLVED, SEE DESCRIPTION

## 2021-06-11 NOTE — Telephone Encounter (Signed)
Path to patient and surgery made by front staff

## 2021-06-18 ENCOUNTER — Encounter: Payer: Self-pay | Admitting: Dermatology

## 2021-06-18 NOTE — Progress Notes (Signed)
   Follow-Up Visit   Subjective  Brett Clark is a 65 y.o. male who presents for the following: Skin Problem (Here to have some lesions checked. Left temple x 2 and 1 on neck. History of non mole skin cancers. ).  Several spots have changed Location:  Duration:  Quality:  Associated Signs/Symptoms: Modifying Factors:  Severity:  Timing: Context:   Objective  Well appearing patient in no apparent distress; mood and affect are within normal limits. Mid Back Gray-brown 5 mm macule with dermoscopic atypia     Left Temple Inflamed pink-brown crust with history of growth, dermoscopy favors irritated keratosis over superficial carcinoma     Mid Back Multiple textured brown 4 to 7 mm flattopped papules on lower neck and back, all dermoscopy typical    All skin waist up examined.   Assessment & Plan    Neoplasm of uncertain behavior of skin (2) Mid Back  Skin / nail biopsy Type of biopsy: tangential   Informed consent: discussed and consent obtained   Timeout: patient name, date of birth, surgical site, and procedure verified   Anesthesia: the lesion was anesthetized in a standard fashion   Anesthetic:  1% lidocaine w/ epinephrine 1-100,000 local infiltration Instrument used: flexible razor blade   Hemostasis achieved with: aluminum chloride and electrodesiccation   Outcome: patient tolerated procedure well   Post-procedure details: wound care instructions given    Specimen 1 - Surgical pathology Differential Diagnosis: atypia  Check Margins: No  Left Temple  Skin / nail biopsy Type of biopsy: tangential   Informed consent: discussed and consent obtained   Timeout: patient name, date of birth, surgical site, and procedure verified   Anesthesia: the lesion was anesthetized in a standard fashion   Anesthetic:  1% lidocaine w/ epinephrine 1-100,000 local infiltration Instrument used: flexible razor blade   Hemostasis achieved with: aluminum chloride and  electrodesiccation   Outcome: patient tolerated procedure well   Post-procedure details: wound care instructions given    Specimen 2 - Surgical pathology Differential Diagnosis: bcc scc  Check Margins: no  Seborrheic keratosis Mid Back  Leave unless there is clinical change      I, Lavonna Monarch, MD, have reviewed all documentation for this visit.  The documentation on 06/18/21 for the exam, diagnosis, procedures, and orders are all accurate and complete.

## 2021-08-02 ENCOUNTER — Other Ambulatory Visit: Payer: Self-pay

## 2021-08-02 ENCOUNTER — Ambulatory Visit (INDEPENDENT_AMBULATORY_CARE_PROVIDER_SITE_OTHER): Payer: 59 | Admitting: Dermatology

## 2021-08-02 ENCOUNTER — Encounter: Payer: Self-pay | Admitting: Dermatology

## 2021-08-02 DIAGNOSIS — D485 Neoplasm of uncertain behavior of skin: Secondary | ICD-10-CM

## 2021-08-02 DIAGNOSIS — D239 Other benign neoplasm of skin, unspecified: Secondary | ICD-10-CM

## 2021-08-02 DIAGNOSIS — D235 Other benign neoplasm of skin of trunk: Secondary | ICD-10-CM | POA: Diagnosis not present

## 2021-08-02 NOTE — Patient Instructions (Signed)

## 2021-09-02 ENCOUNTER — Encounter: Payer: Self-pay | Admitting: Dermatology

## 2021-09-02 NOTE — Progress Notes (Signed)
° °  Follow-Up Visit   Subjective  Brett Clark is a 66 y.o. male who presents for the following: Procedure (Here for wider shave- mid back- mod-severe atypia).  Needs wider shave of atypical mole on back plus irritated mole left underarm Location:  Duration:  Quality:  Associated Signs/Symptoms: Modifying Factors:  Severity:  Timing: Context:   Objective  Well appearing patient in no apparent distress; mood and affect are within normal limits. Mid Back Biopsy site found by nurse and me   Left Axilla Fibroepithelial polyp 4 mm, prone recurrent irritation    All skin waist up examined.   Assessment & Plan    Dysplastic nevus Mid Back  1.5 mm margins marked  Epidermal / dermal shaving - Mid Back  Lesion diameter (cm):  1.3 Informed consent: discussed and consent obtained   Timeout: patient name, date of birth, surgical site, and procedure verified   Anesthesia: the lesion was anesthetized in a standard fashion   Anesthetic:  1% lidocaine w/ epinephrine 1-100,000 local infiltration Instrument used: flexible razor blade   Hemostasis achieved with: aluminum chloride   Outcome: patient tolerated procedure well   Post-procedure details: wound care instructions given    Specimen 1 - Surgical pathology Differential Diagnosis: atypia (WS)  [id number]  Check Margins: No  Neoplasm of uncertain behavior of skin Left Axilla  Skin / nail biopsy Type of biopsy: tangential   Informed consent: discussed and consent obtained   Timeout: patient name, date of birth, surgical site, and procedure verified   Anesthesia: the lesion was anesthetized in a standard fashion   Anesthetic:  1% lidocaine w/ epinephrine 1-100,000 local infiltration Instrument used: flexible razor blade   Hemostasis achieved with: ferric subsulfate   Outcome: patient tolerated procedure well   Post-procedure details: wound care instructions given    Specimen 2 - Surgical pathology Differential  Diagnosis: skin tag  Check Margins: No      I, Lavonna Monarch, MD, have reviewed all documentation for this visit.  The documentation on 09/02/21 for the exam, diagnosis, procedures, and orders are all accurate and complete.

## 2021-11-06 ENCOUNTER — Telehealth: Payer: Self-pay | Admitting: Interventional Cardiology

## 2021-11-06 ENCOUNTER — Other Ambulatory Visit: Payer: Self-pay | Admitting: Physician Assistant

## 2021-11-06 MED ORDER — LISINOPRIL-HYDROCHLOROTHIAZIDE 10-12.5 MG PO TABS
1.0000 | ORAL_TABLET | Freq: Every day | ORAL | 0 refills | Status: DC
Start: 2021-11-06 — End: 2022-04-29

## 2021-11-06 MED ORDER — ATORVASTATIN CALCIUM 80 MG PO TABS: ORAL_TABLET | ORAL | 0 refills | Status: DC

## 2021-11-06 MED ORDER — FENOFIBRATE 160 MG PO TABS: 160.0000 mg | ORAL_TABLET | Freq: Every day | ORAL | 0 refills | Status: DC

## 2021-11-06 NOTE — Telephone Encounter (Signed)
Pt's medications were sent to pt's pharmacy as requested. Confirmation received.  

## 2021-11-06 NOTE — Telephone Encounter (Signed)
?*  STAT* If patient is at the pharmacy, call can be transferred to refill team. ? ? ?1. Which medications need to be refilled? (please list name of each medication and dose if known)  ?atorvastatin (LIPITOR) 80 MG tablet ?lisinopril-hydrochlorothiazide (ZESTORETIC) 10-12.5 MG tablet ?fenofibrate 160 MG tablet ? ?2. Which pharmacy/location (including street and city if local pharmacy) is medication to be sent to? [place], [place] ? ?3. Do they need a 30 day or 90 day supply? 90 day  ?

## 2021-11-09 ENCOUNTER — Other Ambulatory Visit: Payer: Self-pay | Admitting: Physician Assistant

## 2021-12-21 NOTE — Progress Notes (Signed)
Cardiology Office Note:    Date:  12/26/2021   ID:  JARAY BOLIVER, DOB 24-Feb-1956, MRN [id number]  PCP:  Maury Dus, MD  [facility] Cardiologist:  Sinclair Grooms, MD  [facility] Electrophysiologist:  None   Chief Complaint: Yearly follow up  History of Present Illness:    Brett Clark is a 66 y.o. male with a hx of CAD with prior anterior wall infarction requiring DES mid LAD 06/2014, partial right nephrectomy for renal cell carcinoma (clear cell), hyperlipidemia, and chronic combined systolic and diastolic heart failure with last documented EF 50-55% at MI seen for follow up.   Last seen by me 10/2020. Some muscle ache, felt due to dehydration.   Here today for follow up.  No complaints.  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  He gets plenty of exercise by tightly.  He denies chest pain, shortness of breath, orthopnea, PND, syncope, lower extremity edema or melena.  Reported stomachache on Vascepa so he has self discontinued.  No results found for requested labs within last 8760 hours.    Past Medical History:  Diagnosis Date   Abnormal abdominal CT scan    a. Abnl lesion on kidney/liver noted on CT @ [facility]) - no further details available.   Atypical mole 09/06/2003   Upper Lesion (moderate) (widershave)   Atypical mole 09/06/2003   Middle Back Lesion (moderate to severe) (excision)   Atypical mole 11/08/2003   Mid Low Back (moderate to severe) (excision)   Atypical mole 11/08/2003   Right Lower Shoulder (moderate)   Atypical mole 11/08/2003   Mid Upper Back (moderate)   Atypical mole 11/08/2003   Left Mid Back (moderate)   Atypical mole 11/08/2003   Mid Abdomen (moderate) (widershave)   Atypical mole 08/02/2004   Mid Lower Back (moderate)   Atypical mole 08/02/2004   Right Shoulder Sup (moderate) (widershave)   Atypical mole 08/02/2004   Right Shoulder Inf (moderate)   Atypical mole 08/02/2004   Right Scapula (moderate)    Atypical mole 06/22/2007   Left Shoulder (moderate to severe) Mindi Slicker)   Atypical mole 01/22/2005   Mid Back (slight to moderate) Mindi Slicker)   Atypical mole 09/28/2013   Right Lower Back (moderate to severe)   Atypical mole 06/04/2021   mod-severe-mid back (WS)   CAD (coronary artery disease)    a. 06/2014 NSTEMI/PCI: LM nl, LAD 80 (2.5x18 Xience DES), LCX nl, RCA nl, EF 40-45%.   Dyslipidemia    Headache    migraine and tension   Ischemic cardiomyopathy    a. 06/2014 EF 40-45% by LV gram;  b. 06/2014 Echo: EF 50-55%, mild LVH, distal anterior, apical, and inferoapical wall motion abnormalities, DD, mild MR.   Myocardial infarction ([place]) 06/2014   Nodular basal cell carcinoma (BCC) 10/01/2017   Right Outer Forehead (excision)   Right renal mass    SCCA (squamous cell carcinoma) of skin 02/15/2020   Right Forearm-Posterior (well diff) TX CURET CAUTERY 5FU   Tobacco abuse     Past Surgical History:  Procedure Laterality Date   BACK SURGERY  years ago   neck   CARDIAC CATHETERIZATION  06/23/2014   DES to mid LAD, single v disease   hydrocele surgery  15 years ago   LEFT HEART CATHETERIZATION WITH CORONARY ANGIOGRAM N/A 06/23/2014   Procedure: LEFT HEART CATHETERIZATION WITH CORONARY ANGIOGRAM;  Surgeon: Peter M Martinique, MD;  Location: [facility] CATH LAB;  Service: Cardiovascular;  Laterality: N/A;  ROBOTIC ASSITED PARTIAL NEPHRECTOMY Right 01/18/2015   Procedure: ROBOTIC ASSITED PARTIAL NEPHRECTOMY, INTRAOPERATIVE ULTRASOUND, AND INDOCYANINE GREEN DYE;  Surgeon: Alexis Frock, MD;  Location: [facility] ORS;  Service: Urology;  Laterality: Right;    Current Medications: Current Meds  Medication Sig   aspirin EC 81 MG tablet Take 1 tablet (81 mg total) by mouth daily.   atorvastatin (LIPITOR) 80 MG tablet TAKE 1 TABLET BY MOUTH ONCE DAILY AT  6  PM   fenofibrate 160 MG tablet Take 1 tablet (160 mg total) by mouth daily.   icosapent Ethyl (VASCEPA) 1 g capsule Take 2 g by mouth 2 (two)  times daily.   lisinopril-hydrochlorothiazide (ZESTORETIC) 10-12.5 MG tablet Take 1 tablet by mouth daily.     Allergies:   Codeine and Tramadol   Social History   Socioeconomic History   Marital status: Married    Spouse name: Not on file   Number of children: Not on file   Years of education: Not on file   Highest education level: Not on file  Occupational History   Not on file  Tobacco Use   Smoking status: Former    Packs/day: 1.00    Years: 20.00    Pack years: 20.00    Types: Cigarettes   Smokeless tobacco: Never   Tobacco comments:    quit smoking years ago  Vaping Use   Vaping Use: Never used  Substance and Sexual Activity   Alcohol use: Yes    Comment: occasional   Drug use: No   Sexual activity: Not on file  Other Topics Concern   Not on file  Social History Narrative   Not on file   Social Determinants of Health   Financial Resource Strain: Not on file  Food Insecurity: Not on file  Transportation Needs: Not on file  Physical Activity: Not on file  Stress: Not on file  Social Connections: Not on file     Family History: The patient's family history includes CAD in his father; COPD in his father.    ROS:   Please see the history of present illness.    All other systems reviewed and are negative.   EKGs/Labs/Other Studies Reviewed:    The following studies were reviewed today: Echo 06/2014 Study Conclusions  - Left ventricle: The cavity size was normal. Wall thickness was   increased in a pattern of mild LVH. Systolic function was normal.   The estimated ejection fraction was in the range of 50% to 55%.   There is distal anterior, apical and inferoapical hypokinesis,   suggesting LA territory ischemia or infarct. Doppler parameters   are consistent with abnormal left ventricular relaxation (grade 1   diastolic dysfunction). The E/e&' ratio is between 8-15,   suggesting indeterminate LV filling pressure. - Mitral valve: Mildly thickened  leaflets . There was mild   regurgitation. - Left atrium: The atrium was normal in size.  Impressions:  - LVEF 50-55%, distal anterior, apical and inferoapical wall motion   abnormality, consistent with LAD territory ischemia/infarct,   diastolic dysfunction, likely elevated LV filling pressures, mild   MR.   Coronary angiography: 06/2014 Coronary dominance: right   Left mainstem: Normal   Left anterior descending (LAD): The mid LAD has an eccentric 80% lesion with some hypodensity. The first and second diagonal branches are normal.   Left circumflex (LCx): Normal   Right coronary artery (RCA): Normal.   Left ventriculography: Left ventricular systolic function is abnormal, there is akinesis of the  anteroapical and inferoapical wall segments. LVEF is estimated at 40-45%, there is no significant mitral regurgitation    PCI Data: Vessel - LAD/Segment - mid Percent Stenosis (pre)  80% TIMI-flow 3 Stent 2.5 x 18 mm Xience [place] Percent Stenosis (post) 0% TIMI-flow (post) 3   Final Conclusions:    1. Single vessel obstructive CAD 2. Mild to moderate LV dysfunction 3. Successful stenting of the mid LAD with DES    Recommendations:  DAPT for one year. Risk factor modification. Anticipate DC in am.  EKG:  EKG is ordered today.  The ekg ordered today demonstrates normal sinus rhythm with chronic right bundle branch block  Recent Labs: No results found for requested labs within last 8760 hours.  Recent Lipid Panel    Component Value Date/Time   CHOL 119 12/12/2020 0000   TRIG 208 (H) 12/12/2020 0000   HDL 25 (L) 12/12/2020 0000   CHOLHDL 4.8 12/12/2020 0000   CHOLHDL 4.1 09/20/2015 0906   VLDL 18 09/20/2015 0906   LDLCALC 60 12/12/2020 0000    Physical Exam:    VS:  BP 114/72   Pulse 78   Ht '6\' 1"'$  (1.854 m)   Wt 190 lb 6.4 oz (86.4 kg)   BMI 25.12 kg/m     Wt Readings from Last 3 Encounters:  12/26/21 190 lb 6.4 oz (86.4 kg)  10/25/20 186 lb (84.4 kg)   09/07/19 187 lb 3.2 oz (84.9 kg)     GEN:  Well nourished, well developed in no acute distress HEENT: Normal NECK: No JVD; No carotid bruits LYMPHATICS: No lymphadenopathy CARDIAC: RRR, no murmurs, rubs, gallops RESPIRATORY:  Clear to auscultation without rales, wheezing or rhonchi  ABDOMEN: Soft, non-tender, non-distended MUSCULOSKELETAL:  No edema; No deformity  SKIN: Warm and dry NEUROLOGIC:  Alert and oriented x 3 PSYCHIATRIC:  Normal affect   ASSESSMENT AND PLAN:   CAD s/p DES to LAD No angina.  he is very active. Continue aspirin and statin.  2.  HTN Blood pressure stable and controlled on current medication. No change.    3. HLD -Patient will come for blood work next week.  He is nonfasting today. -Continue to hold Vascepa due to stomach issue -Continue Lipitor and fenofibrate   4. Chronic combined CHF - LVEF of 50-55% in 06/2014 -Euvolemic -Continue lisinopril/hydrochlorothiazide  Medication Adjustments/Labs and Tests Ordered: Current medicines are reviewed at length with the patient today.  Concerns regarding medicines are outlined above.  Orders Placed This Encounter  Procedures   Lipid panel   Comprehensive metabolic panel   EKG 62-BJSE   No orders of the defined types were placed in this encounter.   Patient Instructions  Medication Instructions:  Your physician recommends that you continue on your current medications as directed. Please refer to the Current Medication list given to you today.  *If you need a refill on your cardiac medications before your next appointment, please call your pharmacy*   Lab Work: TO BE DONE NEXT WEEK: FASTING LIPIDS, CMET If you have labs (blood work) drawn today and your tests are completely normal, you will receive your results only by: [place] (if you have MyChart) OR A paper copy in the mail If you have any lab test that is abnormal or we need to change your treatment, we will call you to review the  results.   Testing/Procedures: NONE   Follow-Up: At [facility], you and your health needs are our priority.  As part of our continuing mission  to provide you with exceptional heart care, we have created designated Provider Care Teams.  These Care Teams include your primary Cardiologist (physician) and Advanced Practice Providers (APPs -  Physician Assistants and Nurse Practitioners) who all work together to provide you with the care you need, when you need it.  We recommend signing up for the patient portal called "MyChart".  Sign up information is provided on this After Visit Summary.  MyChart is used to connect with patients for Virtual Visits (Telemedicine).  Patients are able to view lab/test results, encounter notes, upcoming appointments, etc.  Non-urgent messages can be sent to your provider as well.   To learn more about what you can do with MyChart, go to [email or web address].    Your next appointment:   1 year(s)  The format for your next appointment:   In Person  Provider:   Sinclair Grooms, MD Or Robbie Lis, PA-C  Important Information About Rolin Barry, [place]  12/26/2021 8:08 AM    [place]

## 2021-12-26 ENCOUNTER — Encounter: Payer: Self-pay | Admitting: Physician Assistant

## 2021-12-26 ENCOUNTER — Ambulatory Visit (INDEPENDENT_AMBULATORY_CARE_PROVIDER_SITE_OTHER): Payer: 59 | Admitting: Physician Assistant

## 2021-12-26 VITALS — BP 114/72 | HR 78 | Ht 73.0 in | Wt 190.4 lb

## 2021-12-26 DIAGNOSIS — I5042 Chronic combined systolic (congestive) and diastolic (congestive) heart failure: Secondary | ICD-10-CM

## 2021-12-26 DIAGNOSIS — Z79899 Other long term (current) drug therapy: Secondary | ICD-10-CM

## 2021-12-26 DIAGNOSIS — E785 Hyperlipidemia, unspecified: Secondary | ICD-10-CM | POA: Diagnosis not present

## 2021-12-26 DIAGNOSIS — I10 Essential (primary) hypertension: Secondary | ICD-10-CM | POA: Diagnosis not present

## 2021-12-26 DIAGNOSIS — I2510 Atherosclerotic heart disease of native coronary artery without angina pectoris: Secondary | ICD-10-CM

## 2021-12-26 NOTE — Patient Instructions (Signed)
Medication Instructions:  Your physician recommends that you continue on your current medications as directed. Please refer to the Current Medication list given to you today.  *If you need a refill on your cardiac medications before your next appointment, please call your pharmacy*   Lab Work: TO BE DONE NEXT WEEK: FASTING LIPIDS, CMET If you have labs (blood work) drawn today and your tests are completely normal, you will receive your results only by: [place] (if you have MyChart) OR A paper copy in the mail If you have any lab test that is abnormal or we need to change your treatment, we will call you to review the results.   Testing/Procedures: NONE   Follow-Up: At [facility], you and your health needs are our priority.  As part of our continuing mission to provide you with exceptional heart care, we have created designated Provider Care Teams.  These Care Teams include your primary Cardiologist (physician) and Advanced Practice Providers (APPs -  Physician Assistants and Nurse Practitioners) who all work together to provide you with the care you need, when you need it.  We recommend signing up for the patient portal called "MyChart".  Sign up information is provided on this After Visit Summary.  MyChart is used to connect with patients for Virtual Visits (Telemedicine).  Patients are able to view lab/test results, encounter notes, upcoming appointments, etc.  Non-urgent messages can be sent to your provider as well.   To learn more about what you can do with MyChart, go to [email or web address].    Your next appointment:   1 year(s)  The format for your next appointment:   In Person  Provider:   Sinclair Grooms, MD Or Robbie Lis, PA-C  Important Information About Sugar

## 2022-01-08 ENCOUNTER — Telehealth: Payer: Self-pay

## 2022-01-08 NOTE — Telephone Encounter (Signed)
Left detailed message for the patient to come to the office this week to have fasting labs. Orders were placed at his last visit; he just needs to complete the lab work.

## 2022-01-29 ENCOUNTER — Other Ambulatory Visit: Payer: 59

## 2022-01-29 DIAGNOSIS — I2510 Atherosclerotic heart disease of native coronary artery without angina pectoris: Secondary | ICD-10-CM

## 2022-01-29 DIAGNOSIS — I10 Essential (primary) hypertension: Secondary | ICD-10-CM

## 2022-01-29 DIAGNOSIS — I5042 Chronic combined systolic (congestive) and diastolic (congestive) heart failure: Secondary | ICD-10-CM

## 2022-01-29 DIAGNOSIS — Z79899 Other long term (current) drug therapy: Secondary | ICD-10-CM

## 2022-01-29 DIAGNOSIS — E785 Hyperlipidemia, unspecified: Secondary | ICD-10-CM

## 2022-01-29 LAB — COMPREHENSIVE METABOLIC PANEL
ALT: 27 IU/L (ref 0–44)
AST: 23 IU/L (ref 0–40)
Albumin/Globulin Ratio: 2 (ref 1.2–2.2)
Albumin: 4.7 g/dL (ref 3.8–4.8)
Alkaline Phosphatase: 51 IU/L (ref 44–121)
BUN/Creatinine Ratio: 12 (ref 10–24)
BUN: 19 mg/dL (ref 8–27)
Bilirubin Total: 0.3 mg/dL (ref 0.0–1.2)
CO2: 25 mmol/L (ref 20–29)
Calcium: 9.9 mg/dL (ref 8.6–10.2)
Chloride: 102 mmol/L (ref 96–106)
Creatinine, Ser: 1.62 mg/dL — ABNORMAL HIGH (ref 0.76–1.27)
Globulin, Total: 2.3 g/dL (ref 1.5–4.5)
Glucose: 102 mg/dL — ABNORMAL HIGH (ref 70–99)
Potassium: 4.7 mmol/L (ref 3.5–5.2)
Sodium: 142 mmol/L (ref 134–144)
Total Protein: 7 g/dL (ref 6.0–8.5)
eGFR: 47 mL/min/{1.73_m2} — ABNORMAL LOW (ref >59)

## 2022-01-29 LAB — LIPID PANEL
Chol/HDL Ratio: 8.4 ratio — ABNORMAL HIGH (ref 0.0–5.0)
Cholesterol, Total: 210 mg/dL — ABNORMAL HIGH (ref 100–199)
HDL: 25 mg/dL — ABNORMAL LOW (ref >39)
LDL Chol Calc (NIH): 138 mg/dL — ABNORMAL HIGH (ref 0–99)
Triglycerides: 259 mg/dL — ABNORMAL HIGH (ref 0–149)
VLDL Cholesterol Cal: 47 mg/dL — ABNORMAL HIGH (ref 5–40)

## 2022-02-01 ENCOUNTER — Other Ambulatory Visit: Payer: Self-pay

## 2022-02-01 DIAGNOSIS — E7800 Pure hypercholesterolemia, unspecified: Secondary | ICD-10-CM

## 2022-02-18 ENCOUNTER — Encounter: Payer: Self-pay | Admitting: Dermatology

## 2022-02-18 ENCOUNTER — Ambulatory Visit (INDEPENDENT_AMBULATORY_CARE_PROVIDER_SITE_OTHER): Payer: 59 | Admitting: Dermatology

## 2022-02-18 DIAGNOSIS — L570 Actinic keratosis: Secondary | ICD-10-CM

## 2022-02-18 DIAGNOSIS — L821 Other seborrheic keratosis: Secondary | ICD-10-CM | POA: Diagnosis not present

## 2022-02-18 DIAGNOSIS — Z1283 Encounter for screening for malignant neoplasm of skin: Secondary | ICD-10-CM

## 2022-02-22 ENCOUNTER — Other Ambulatory Visit: Payer: Self-pay | Admitting: Interventional Cardiology

## 2022-03-04 ENCOUNTER — Ambulatory Visit (INDEPENDENT_AMBULATORY_CARE_PROVIDER_SITE_OTHER): Payer: 59 | Admitting: Pharmacist

## 2022-03-04 ENCOUNTER — Telehealth: Payer: Self-pay | Admitting: Pharmacist

## 2022-03-04 DIAGNOSIS — I2510 Atherosclerotic heart disease of native coronary artery without angina pectoris: Secondary | ICD-10-CM | POA: Diagnosis not present

## 2022-03-04 DIAGNOSIS — E785 Hyperlipidemia, unspecified: Secondary | ICD-10-CM | POA: Diagnosis not present

## 2022-03-04 NOTE — Patient Instructions (Addendum)
Try to cut back on soda, sweet tea and alcohol (sugary drinks). Limit potato/corn portions  I will submit a prior authorization for Repatha and will let you know when I hear back   Tips for living a healthier life     Building a Healthy and Balanced Diet Make most of your meal vegetables and fruits -  of your plate. Aim for color and variety, and remember that potatoes don't count as vegetables on the Healthy Eating Plate because of their negative impact on blood sugar.  Go for whole grains -  of your plate. Whole and intact grains--whole wheat, barley, wheat berries, quinoa, oats, brown rice, and foods made with them, such as whole wheat pasta--have a milder effect on blood sugar and insulin than white bread, white rice, and other refined grains.  Protein power -  of your plate. Fish, poultry, beans, and nuts are all healthy, versatile protein sources--they can be mixed into salads, and pair well with vegetables on a plate. Limit red meat, and avoid processed meats such as bacon and sausage.  Healthy plant oils - in moderation. Choose healthy vegetable oils like olive, canola, soy, corn, sunflower, peanut, and others, and avoid partially hydrogenated oils, which contain unhealthy trans fats. Remember that low-fat does not mean "healthy."  Drink water, coffee, or tea. Skip sugary drinks, limit milk and dairy products to one to two servings per day, and limit juice to a small glass per day.  Stay active. The red figure running across the [place] is a reminder that staying active is also important in weight control.  The main message of the Healthy Eating Plate is to focus on diet quality:  The type of carbohydrate in the diet is more important than the amount of carbohydrate in the diet, because some sources of carbohydrate--like vegetables (other than potatoes), fruits, whole grains, and beans--are healthier than others. The Healthy Eating Plate also advises  consumers to avoid sugary beverages, a major source of calories--usually with little nutritional value--in the American diet. The Healthy Eating Plate encourages consumers to use healthy oils, and it does not set a maximum on the percentage of calories people should get each day from healthy sources of fat. In this way, the Healthy Eating Plate recommends the opposite of the low-fat message promoted for decades by the USDA.  DeskDistributor.no  SUGAR  Sugar is a huge problem in the modern day diet. Sugar is a big contributor to heart disease, diabetes, high triglyceride levels, fatty liver disease and obesity. Sugar is hidden in almost all packaged foods/beverages. Added sugar is extra sugar that is added beyond what is naturally found and has no nutritional benefit for your body. The American Heart Association recommends limiting added sugars to no more than 25g for women and 36 grams for men per day. There are many names for sugar including maltose, sucrose (names ending in "ose"), high fructose corn syrup, molasses, cane sugar, corn sweetener, raw sugar, syrup, honey or fruit juice concentrate.   One of the best ways to limit your added sugars is to stop drinking sweetened beverages such as soda, sweet tea, and fruit juice.  There is 65g of added sugars in one 20oz bottle of Coke! That is equal to 7.5 donuts.   Pay attention and read all nutrition facts labels. Below is an examples of a nutrition facts label. The #1 is showing you the total sugars where the # 2 is showing you the added sugars. This one serving  has almost the max amount of added sugars per day!     20 oz Soda 65g Sugar = 7.5 Glazed Donuts  16oz Energy  Drink 54g Sugar = 6.5 Glazed Donuts  Large Sweet  Tea 38g Sugar = 4 Glazed Donuts  20oz Sports  Drink 34g Sugar = 3.5 Glazed Donuts  8oz Chocolate Milk 24g Sugar =2.5 Glazed Donuts  8oz Orange  Juice 21g Sugar = 2  Glazed Donuts  1 Juice Box 14g Sugar = 1.5 Glazed Donuts  16oz Water= NO SUGAR!!  EXERCISE  Exercise is good. We've all heard that. In an ideal world, we would all have time and resources to get plenty of it. When you are active, your heart pumps more efficiently and you will feel better.  Multiple studies show that even walking regularly has benefits that include living a longer life. The American Heart Association recommends 150 minutes per week of exercise (30 minutes per day most days of the week). You can do this in any increment you wish. Nine or more 10-minute walks count. So does an hour-long exercise class. Break the time apart into what will work in your life. Some of the best things you can do include walking briskly, jogging, cycling or swimming laps. Not everyone is ready to "exercise." Sometimes we need to start with just getting active. Here are some easy ways to be more active throughout the day:  Take the stairs instead of the elevator  Go for a 10-15 minute walk during your lunch break (find a friend to make it more enjoyable)  When shopping, park at the back of the parking lot  If you take public transportation, get off one stop early and walk the extra distance  Pace around while making phone calls  Check with your doctor if you aren't sure what your limitations may be. Always remember to drink plenty of water when doing any type of exercise. Don't feel like a failure if you're not getting the 90-150 minutes per week. If you started by being a couch potato, then just a 10-minute walk each day is a huge improvement. Start with little victories and work your way up.   HEALTHY EATING TIPS  When looking to improve your eating habits, whether to lose weight, lower blood pressure or just be healthier, it helps to know what a serving size is.   Grains 1 slice of bread,  bagel,  cup pasta or rice  Vegetables 1 cup fresh or raw vegetables,  cup cooked or canned Fruits 1 piece  of medium sized fruit,  cup canned,   Meats/Proteins  cup dried       1 oz meat, 1 egg,  cup cooked beans, nuts or seeds  Dairy        Fats Individual yogurt container, 1 cup (8oz)    1 teaspoon margarine/butter or vegetable  milk or milk alternative, 1 slice of cheese          oil; 1 tablespoon mayonnaise or salad dressing                  Plan ahead: make a menu of the meals for a week then create a grocery list to go with that menu. Consider meals that easily stretch into a night of leftovers, such as stews or casseroles. Or consider making two of your favorite meal and put one in the freezer for another night. Try a night or two each week that is "meatless" or "no cook"  such as salads. When you get home from the grocery store wash and prepare your vegetables and fruits. Then when you need them they are ready to go.   Tips for going to the grocery store:  [place] store or generic brands  Check the weekly ad from your store on-line or in their in-store flyer  Look at the unit price on the shelf tag to compare/contrast the costs of different items  Buy fruits/vegetables in season  Carrots, bananas and apples are low-cost, naturally healthy items  If meats or frozen vegetables are on sale, buy some extras and put in your freezer  Limit buying prepared or "ready to eat" items, even if they are pre-made salads or fruit snacks  Do not shop when you're hungry  Foods at eye level tend to be more expensive. Look on the high and low shelves for deals.  Consider shopping at the farmer's market for fresh foods in season.  Avoid the cookie and chip aisles (these are expensive, high in calories and low in nutritional value). Shop on the outside of the grocery store.  Healthy food preparations:  If you can't get lean hamburger, be sure to drain the fat when cooking  Steam, saut (in olive oil), grill or bake foods  Experiment with different seasonings to avoid adding salt to your foods. Kosher salt, sea  salt and Himalayan salt are all still salt and should be avoided. Try seasoning food with onion, garlic, thyme, rosemary, basil ect. Onion powder or garlic powder is ok. Avoid if it says salt (ie garlic salt).

## 2022-03-04 NOTE — Progress Notes (Signed)
Patient ID: Brett Clark                 DOB: [birth year]                    MRN: [id number]     HPI: Brett Clark is a 66 y.o. male patient of Dr. Thompson Caul referred to lipid clinic by Robbie Lis, PA. PMH is significant for CAD with prior anterior wall infarction requiring DES mid LAD 06/2014, partial right nephrectomy for renal cell carcinoma (clear cell), hyperlipidemia, and chronic combined systolic and diastolic heart failure with last documented EF 50-55% at MI.  LDL-C previously 60 in May 2022, TG historically high. LDL-C in June was 138. A1C in Oct 2021 was 6.3. Pt has CKD, eGFR 47.  Patient presents today to lipid clinic. He states he has been taking atorvastatin 63m. Does not remember ezetimibe and per fill history he is most likely not taking. He is on his wife's cPharmacist, community Vascepa gave him a stomach ache wether he took it with food or not. He drinks a lot of mountain dew, up to 4, 12oz cans per day. States that he is never going to stop drinking but is willing to try cutting back. Drinks more water in the hot months (works outside) but does not like water. He will also sometimes have sweet tea at lunch and drinks vodka in OJ. He drinks about 7 standard drinks of vodka per week. Not every night and no more than 2 drinks per night usually. Thinks it will be easier to cut back on alcohol than mountain dew. He is very active at work and will walk with his wife at home a few days a week.   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    Social History: vodka 10 oz per week on average, not every week, no tobacco use, positive marijuana  Labs: 01/29/22 TC 210 TG 259 HDL 25 LDL-C 138 (atorvastatin 8040maily)  Past Medical History:  Diagnosis Date   Abnormal abdominal CT scan    a. Abnl [facility] on kidney/liver [facility] no further details available.   Atypical mole 09/06/2003   Upper Lesion (moderate) (widershave)   Atypical mole 09/06/2003   Middle Back Lesion (moderate to severe) (excision)   Atypical mole 11/08/2003   Mid Low Back (moderate to severe) (excision)   Atypical mole 11/08/2003   Right Lower Shoulder (moderate)   Atypical mole 11/08/2003   Mid Upper Back (moderate)   Atypical mole 11/08/2003   Left Mid Back (moderate)   Atypical mole 11/08/2003   Mid Abdomen (moderate) (widershave)   Atypical mole 08/02/2004   Mid Lower Back (moderate)   Atypical mole 08/02/2004   Right Shoulder Sup (moderate) (widershave)   Atypical mole 08/02/2004   Right Shoulder Inf (moderate)   Atypical mole 08/02/2004   Right Scapula (moderate)   Atypical mole 06/22/2007   Left Shoulder (moderate to severe) (widershave)   Atypical  mole 01/22/2005   Mid Back (slight to moderate) Mindi Slicker)   Atypical mole 09/28/2013   Right Lower Back (moderate to severe)   Atypical mole 06/04/2021   mod-severe-mid back (WS)   CAD (coronary artery disease)    a. 06/2014 NSTEMI/PCI: LM nl, LAD 80 (2.5x18 Xience DES), LCX nl, RCA nl, EF 40-45%.   Dyslipidemia    Headache    migraine and tension   Ischemic cardiomyopathy    a. 06/2014 EF 40-45% by LV gram;  b. 06/2014 Echo: EF 50-55%, mild LVH, distal anterior, apical, and inferoapical wall motion abnormalities, DD, mild MR.   Myocardial infarction ([place]) 06/2014   Nodular basal cell carcinoma (BCC) 10/01/2017   Right Outer Forehead (excision)   Right renal mass    SCCA (squamous cell  carcinoma) of skin 02/15/2020   Right Forearm-Posterior (well diff) TX CURET CAUTERY 5FU   Tobacco abuse     Current Outpatient Medications on File Prior to Visit  Medication Sig Dispense Refill   aspirin EC 81 MG tablet Take 1 tablet (81 mg total) by mouth daily.     atorvastatin (LIPITOR) 10 MG tablet Take by mouth.     atorvastatin (LIPITOR) 80 MG tablet TAKE 1 TABLET BY MOUTH ONCE DAILY AT  6  PM 90 tablet 3   ezetimibe (ZETIA) 10 MG tablet Take 10 mg by mouth daily.     fenofibrate 160 MG tablet Take 1 tablet (160 mg total) by mouth daily. 90 tablet 0   HYDROcodone-acetaminophen (NORCO/VICODIN) 5-325 MG tablet Take 1 tablet by mouth every 6 (six) hours as needed.     icosapent Ethyl (VASCEPA) 1 g capsule Take 2 g by mouth 2 (two) times daily.     lisinopril-hydrochlorothiazide (ZESTORETIC) 10-12.5 MG tablet Take 1 tablet by mouth daily. 90 tablet 0   methocarbamol (ROBAXIN) 500 MG tablet Take 500 mg by mouth 3 (three) times daily.     No current facility-administered medications on file prior to visit.    Allergies  Allergen Reactions   Codeine     headaches   Tramadol     Assessment/Plan:  1. Hyperlipidemia - LDL-C is above goal of <55. Discussed PCSK9i therapy with patient. Reviewed cost, injection technique and side effects. Patient willing to proceed. Will submit PA for Repatha. Continue atorvastatin 73m daily. We also discussed his persistently elevated TG. His A1C in 2021 was prediabetic. He drinks a good amount of soda and has for many years (20 years per pt). I reviewed how excessive sugar intake (especially fructose from sugar) is stored as visceral fat around liver. This fatty liver can affect the signaling with the pancreas resulting in insulin resistance and eventually diabetes. I have strong encouraged him to stop drinking soda and other sugary drinks (sweet tea, orange juice). Patient states that he wont stop drinking soda, but he will try to cut back. We also  discussed decreasing alcohol intake which patient felt that he could do. His elevated TG most likely have a lot to do with his diet and blood sugar control. He did not want to check an A1C today, but would like to work on diet and check A1C when we check his cholesterol in October. Diet reviewed in detail. Luckily he is very active person. Discouraged marijuana use. Labs scheduled for Oct 26th.  Thank you,  MRamond Dial Pharm.D, BCPS, CPP [place] [id number] [place] [id number] Phone: [phone] Fax: [phone]

## 2022-03-04 NOTE — Telephone Encounter (Signed)
Repatha approved through 03/05/23. Patient will need to call [phone] to get a copay card from [place]. LVM for pt to call back Patient will need to be reminded that he is to continue atorvastatin.

## 2022-03-07 MED ORDER — REPATHA SURECLICK 140 MG/ML ~~LOC~~ SOAJ: 1.0000 | SUBCUTANEOUS | 11 refills | Status: DC

## 2022-03-07 NOTE — Telephone Encounter (Signed)
Spoke with patient and gave him info below. Rx sent to pharmacy.

## 2022-03-10 ENCOUNTER — Encounter: Payer: Self-pay | Admitting: Dermatology

## 2022-03-11 NOTE — Progress Notes (Signed)
   Follow-Up Visit   Subjective  Brett Clark is a 66 y.o. male who presents for the following: Annual Exam (Few scaly spots on face - wants checked).  Annual skin examination, several spots on face Location:  Duration:  Quality:  Associated Signs/Symptoms: Modifying Factors:  Severity:  Timing: Context:   Objective  Well appearing patient in no apparent distress; mood and affect are within normal limits. Full body skin examination-no atypical pigmented lesions (all checked with dermoscopy), no new or recurrent nonmelanoma skin cancer  Left Malar Cheek, Left Parotid Area, Right Forehead 3 hornlike 4 mm pink crusts  Right Upper Back Brown flattopped 5 mm textured papule, compatible dermoscopy    A full examination was performed including scalp, head, eyes, ears, nose, lips, neck, chest, axillae, abdomen, back, buttocks, bilateral upper extremities, bilateral lower extremities, hands, feet, fingers, toes, fingernails, and toenails. All findings within normal limits unless otherwise noted below.   Assessment & Plan    AK (actinic keratosis) (3) Left Parotid Area; Left Malar Cheek; Right Forehead  Destruction of lesion - Left Malar Cheek, Left Parotid Area, Right Forehead Complexity: simple   Destruction method: cryotherapy   Informed consent: discussed and consent obtained   Timeout:  patient name, date of birth, surgical site, and procedure verified Lesion destroyed using liquid nitrogen: Yes   Cryotherapy cycles:  3 Outcome: patient tolerated procedure well with no complications    Encounter for screening for malignant neoplasm of skin  Annual skin examination  Seborrheic keratosis Right Upper Back  Leave if stable      I, Lavonna Monarch, MD, have reviewed all documentation for this visit.  The documentation on 03/11/22 for the exam, diagnosis, procedures, and orders are all accurate and complete.

## 2022-03-27 ENCOUNTER — Other Ambulatory Visit: Payer: Self-pay

## 2022-03-27 MED ORDER — FENOFIBRATE 160 MG PO TABS: 160.0000 mg | ORAL_TABLET | Freq: Every day | ORAL | 2 refills | Status: DC

## 2022-04-29 ENCOUNTER — Other Ambulatory Visit: Payer: Self-pay

## 2022-04-29 MED ORDER — LISINOPRIL-HYDROCHLOROTHIAZIDE 10-12.5 MG PO TABS: 1.0000 | ORAL_TABLET | Freq: Every day | ORAL | 2 refills | Status: DC

## 2022-05-30 ENCOUNTER — Other Ambulatory Visit: Payer: 59

## 2022-07-10 ENCOUNTER — Ambulatory Visit: Payer: 59 | Attending: Interventional Cardiology

## 2022-07-10 DIAGNOSIS — I2510 Atherosclerotic heart disease of native coronary artery without angina pectoris: Secondary | ICD-10-CM

## 2022-07-10 DIAGNOSIS — E785 Hyperlipidemia, unspecified: Secondary | ICD-10-CM

## 2022-07-11 LAB — LIPID PANEL
Chol/HDL Ratio: 5 ratio (ref 0.0–5.0)
Cholesterol, Total: 145 mg/dL (ref 100–199)
HDL: 29 mg/dL — ABNORMAL LOW (ref >39)
LDL Chol Calc (NIH): 79 mg/dL (ref 0–99)
Triglycerides: 222 mg/dL — ABNORMAL HIGH (ref 0–149)
VLDL Cholesterol Cal: 37 mg/dL (ref 5–40)

## 2022-07-11 LAB — COMPREHENSIVE METABOLIC PANEL
ALT: 33 IU/L (ref 0–44)
AST: 26 IU/L (ref 0–40)
Albumin/Globulin Ratio: 2.2 (ref 1.2–2.2)
Albumin: 4.9 g/dL (ref 3.9–4.9)
Alkaline Phosphatase: 53 IU/L (ref 44–121)
BUN/Creatinine Ratio: 13 (ref 10–24)
BUN: 18 mg/dL (ref 8–27)
Bilirubin Total: 0.3 mg/dL (ref 0.0–1.2)
CO2: 25 mmol/L (ref 20–29)
Calcium: 10.2 mg/dL (ref 8.6–10.2)
Chloride: 102 mmol/L (ref 96–106)
Creatinine, Ser: 1.39 mg/dL — ABNORMAL HIGH (ref 0.76–1.27)
Globulin, Total: 2.2 g/dL (ref 1.5–4.5)
Glucose: 88 mg/dL (ref 70–99)
Potassium: 4.7 mmol/L (ref 3.5–5.2)
Sodium: 142 mmol/L (ref 134–144)
Total Protein: 7.1 g/dL (ref 6.0–8.5)
eGFR: 56 mL/min/{1.73_m2} — ABNORMAL LOW (ref >59)

## 2022-07-11 LAB — APOLIPOPROTEIN B: Apolipoprotein B: 85 mg/dL (ref <90)

## 2022-07-11 LAB — HEMOGLOBIN A1C
Est. average glucose Bld gHb Est-mCnc: 131 mg/dL
Hgb A1c MFr Bld: 6.2 % — ABNORMAL HIGH (ref 4.8–5.6)

## 2022-07-15 ENCOUNTER — Telehealth: Payer: Self-pay | Admitting: Pharmacist

## 2022-07-15 NOTE — Telephone Encounter (Signed)
LDL-C better, but above goal of <55. TG just slightly better, but A1C is worse. Spoke with patient who states he hasn't taken Repatha in about 4 weeks. States it makes him nauseous. Has been taking atorvastatin. Does not wish to try any other medications right now. Wants to recheck in 3 months. States he has cut back on alcohol and has increased exercise some.   Will call patient in the new year to schedule labs as he didn't want to schedule now.

## 2022-07-23 NOTE — Therapy (Signed)
OUTPATIENT PHYSICAL THERAPY CERVICAL EVALUATION   Patient Name: Brett Clark MRN: [id number] DOB:1956-01-21, 66 y.o., male Today's Date: 07/25/2022  END OF SESSION:  PT End of Session - 07/24/22 1814     Visit Number 1    Number of Visits 13    Date for PT Re-Evaluation 09/13/22    Authorization Type [employer] OTHER    PT Start Time 0720    PT Stop Time 0804    PT Time Calculation (min) 44 min    Activity Tolerance Patient tolerated treatment well    Behavior During Therapy WFL for tasks assessed/performed             Past Medical History:  Diagnosis Date   Abnormal abdominal CT scan    a. Abnl lesion on kidney/liver noted on CT @ [facility]) - no further details available.   Atypical mole 09/06/2003   Upper Lesion (moderate) (widershave)   Atypical mole 09/06/2003   Middle Back Lesion (moderate to severe) (excision)   Atypical mole 11/08/2003   Mid Low Back (moderate to severe) (excision)   Atypical mole 11/08/2003   Right Lower Shoulder (moderate)   Atypical mole 11/08/2003   Mid Upper Back (moderate)   Atypical mole 11/08/2003   Left Mid Back (moderate)   Atypical mole 11/08/2003   Mid Abdomen (moderate) (widershave)   Atypical mole 08/02/2004   Mid Lower Back (moderate)   Atypical mole 08/02/2004   Right Shoulder Sup (moderate) (widershave)   Atypical mole 08/02/2004   Right Shoulder Inf (moderate)   Atypical mole 08/02/2004   Right Scapula (moderate)   Atypical mole 06/22/2007   Left Shoulder (moderate to severe) Mindi Slicker)   Atypical mole 01/22/2005   Mid Back (slight to moderate) Mindi Slicker)   Atypical mole 09/28/2013   Right Lower Back (moderate to severe)   Atypical mole 06/04/2021   mod-severe-mid back (WS)   CAD (coronary artery disease)    a. 06/2014 NSTEMI/PCI: LM nl, LAD 80 (2.5x18 Xience DES), LCX nl, RCA nl, EF 40-45%.   Dyslipidemia    Headache    migraine and tension   Ischemic cardiomyopathy    a. 06/2014 EF  40-45% by LV gram;  b. 06/2014 Echo: EF 50-55%, mild LVH, distal anterior, apical, and inferoapical wall motion abnormalities, DD, mild MR.   Myocardial infarction ([place]) 06/2014   Nodular basal cell carcinoma (BCC) 10/01/2017   Right Outer Forehead (excision)   Right renal mass    SCCA (squamous cell carcinoma) of skin 02/15/2020   Right Forearm-Posterior (well diff) [place] CURET CAUTERY 5FU   Tobacco abuse    Past Surgical History:  Procedure Laterality Date   BACK SURGERY  years ago   neck   CARDIAC CATHETERIZATION  06/23/2014   DES to mid LAD, single v disease   hydrocele surgery  15 years ago   LEFT HEART CATHETERIZATION WITH CORONARY ANGIOGRAM N/A 06/23/2014   Procedure: LEFT HEART CATHETERIZATION WITH CORONARY ANGIOGRAM;  Surgeon: Peter M Martinique, MD;  Location: [facility] CATH LAB;  Service: Cardiovascular;  Laterality: N/A;   ROBOTIC ASSITED PARTIAL NEPHRECTOMY Right 01/18/2015   Procedure: ROBOTIC ASSITED PARTIAL NEPHRECTOMY, INTRAOPERATIVE ULTRASOUND, AND INDOCYANINE GREEN DYE;  Surgeon: Alexis Frock, MD;  Location: [facility]S;  Service: Urology;  Laterality: Right;   Patient Active Problem List   Diagnosis Date Noted   Nodular basal cell carcinoma 09/07/2018   Multiple atypical skin moles 09/07/2018   Impingement syndrome of right shoulder 03/31/2018   CKD (chronic kidney disease),  stage III ([place]) 11/24/2017   Hypertension 11/18/2017   Renal cell carcinoma ([place]) 09/19/2015   CAD (coronary artery disease)    Hyperlipidemia    Ischemic cardiomyopathy    Tobacco abuse    CAD S/P PCI mLAD 2.5 x 18 mm Xience Alpine DES 06/24/2014    Class: Status post   Non-STEMI (non-ST elevated myocardial infarction) ([place]) 06/22/2014   Abnormal abdominal CT scan 06/22/2014   Epigastric abdominal tenderness 06/22/2014    PCP: Maury Dus, MD  REFERRING PROVIDER: Kary Kos, MD   REFERRING DIAG: [phone] (ICD-10-CM) - Radiculopathy, cervical, C6   THERAPY DIAG:  Cervicalgia - Plan: PT plan of  care cert/re-cert  Cramp and spasm - Plan: PT plan of care cert/re-cert  Muscle weakness (generalized) - Plan: PT plan of care cert/re-cert  Rationale for Evaluation and Treatment: Rehabilitation  ONSET DATE: Approx 1 year  SUBJECTIVE:                                                                                                                                                                                                         SUBJECTIVE STATEMENT: Pt is not able to recall a MOI. Pt denies UE pain or N/T  PERTINENT HISTORY:  C3-4 fusion 2009; CAD; ischemic cardiomyopathy  PAIN:  Are you having pain? Yes: NPRS scale: 5/10 Pain location: base of skull and upper neck  Pain description: ache, sharp, constant, wrose at end of day, and head feels heavy Aggravating factors: End of day, looking up and turning head Relieving factors: Ibuprofen, hot shower On eval: pain range: 2-10/10. Ave pain 6/10  PRECAUTIONS: None  WEIGHT BEARING RESTRICTIONS: No  FALLS:  Has patient fallen in last 6 months? No  LIVING ENVIRONMENT: Lives with: lives with their spouse Lives in: House/apartment No issues with accessing or mobility within home  OCCUPATION: Mechanic  PLOF: Independent  PATIENT GOALS: Decrease pain  and to avoid surgery  NEXT MD VISIT: late Feb or March  OBJECTIVE:   DIAGNOSTIC FINDINGS:  MRI cervical 08/01/18     IMPRESSION: 1. Slight progression of disc bulges and small central disc protrusions at C4-5 and C5-6 with slight increased indentation upon the thecal sac and cervical spinal cord at both levels but without myelopathy. 2. Stable myelopathy at C3-4 with no residual neural impingement.  Pt reports a MRI by Dr. Saintclair Halsted approx 3 months ago-unavailable in Epic  PATIENT SURVEYS:  FOTO: Perceived function   41%, predicted   53%   COGNITION: Overall cognitive status: Within functional limits for tasks assessed  SENSATION: [employer]  POSTURE: rounded shoulders,  decreased lumbar lordosis, and decreased thoracic kyphosis  PALPATION: TTP of the suboccipital muscles bilat R>L  CERVICAL ROM:   Active ROM A/PROM (deg) eval  Flexion 21 upper neck pulling  Extension 11 upper neck pressure pain  Right lateral flexion 10 R post/lat neck pain  Left lateral flexion 5 R post/lat nack pain  Right rotation 10 post/lat neck pain  Left rotation 20 blat upper shoulder pain   (Blank rows = not tested)  UPPER EXTREMITY ROM: Grossly WNLs and eqaul Active ROM Right eval Left eval  Shoulder flexion    Shoulder extension    Shoulder abduction    Shoulder adduction    Shoulder extension    Shoulder internal rotation    Shoulder external rotation    Elbow flexion    Elbow extension    Wrist flexion    Wrist extension    Wrist ulnar deviation    Wrist radial deviation    Wrist pronation    Wrist supination     (Blank rows = not tested)  UPPER EXTREMITY MMT: Myotome screen is negative. 5/5 strength and equal MMT Right eval Left eval  Shoulder flexion    Shoulder extension    Shoulder abduction    Shoulder adduction    Shoulder extension    Shoulder internal rotation    Shoulder external rotation    Middle trapezius    Lower trapezius    Elbow flexion    Elbow extension    Wrist flexion    Wrist extension    Wrist ulnar deviation    Wrist radial deviation    Wrist pronation    Wrist supination    Grip strength     (Blank rows = not tested)  CERVICAL SPECIAL TESTS:  Neck flexor muscle endurance test: Positive 3", Spurling's test: Negative, and Distraction test: Negative  FUNCTIONAL TESTS:  NT  TODAY'S TREATMENT:                                                                                                                               [facility] Adult PT Treatment:                                                DATE: 07/24/22 Therapeutic Exercise: Developed, instructed in, and pt completed therex as noted in HEP  Self Care: Heating  pad as needed   PATIENT EDUCATION:  Education details: Eval findings, POC, HEP, self care  Person educated: Patient Education method: Explanation, Demonstration, Tactile cues, Verbal cues, and Handouts Education comprehension: verbalized understanding, returned demonstration, verbal cues required, and tactile cues required  HOME EXERCISE PROGRAM: Access Code: [email or web address] URL: [email or web address] Date: 07/24/2022 Prepared by: Gar Ponto  Exercises - Supine Cervical Retraction with Towel  - 2 x daily - 7 x weekly -  1 sets - 5 reps - 5 hold - Supine Deep Neck Flexor Training - Repetitions  - 1 x daily - 7 x weekly - 1 sets - 5 reps - 10 hold - Seated Passive Cervical Retraction  - 6 x daily - 7 x weekly - 1 sets - 3-5 reps - 5 hold - Seated Upper Trapezius Stretch  - 6 x daily - 7 x weekly - 1 sets - 3-5 reps - 15 hold - Standing Cervical Rotation AROM with Overpressure  - 6 x daily - 7 x weekly - 1 sets - 3-5 reps - 15 hold  ASSESSMENT:  CLINICAL IMPRESSION: Patient is a 66 y.o. male who was seen today for physical therapy evaluation and treatment for M54.12 (ICD-10-CM) - Radiculopathy, cervical region . Pt presents with markedly decreased cervical ROM and weakness, decreased thoracic and lumbar curves, and TTP of the suboccipital muscles. Pt is not experiencing radicular signs and symptoms and the myotome screen of the UEs is negative.  OBJECTIVE IMPAIRMENTS: decreased activity tolerance, decreased ROM, decreased strength, increased fascial restrictions, increased muscle spasms, postural dysfunction, and pain.   ACTIVITY LIMITATIONS: reach over head and looking up  PARTICIPATION LIMITATIONS: driving and occupation  PERSONAL FACTORS: Past/current experiences and Time since onset of injury/illness/exacerbation are also affecting patient's functional outcome.   REHAB POTENTIAL: Good  CLINICAL DECISION MAKING: Evolving/moderate complexity  EVALUATION COMPLEXITY:  Moderate   GOALS:  SHORT TERM GOALS: Target date: 08/15/22  Pt will be Ind in an initial HEP Baseline: Initiated Goal status: INITIAL  2.  Pt will voice understanding of measures to assist in pain reduction  Baseline: initiated Goal status: INITIAL   LONG TERM GOALS: Target date: 09/13/22  Pt will be Ind in a final HEP to maintain achieved LOF Baseline: initiated Goal status: INITIAL  2.  Pt will report a decrease in neck pain range to 0- 4/10 for improved function and QOL  Baseline: 2-10/10 Goal status: INITIAL  3.  Pt's neck AROM will incrrease by 10d for each motion for improved function with work and driving Baseline: see flow sheets Goal status: INITIAL  4.  Pt's FOTO score will improved to the predicted value of 53% as indication of improved function  Baseline: 41% Goal status: INITIAL  PLAN:  PT FREQUENCY: 2x/week  PT DURATION: 6 weeks  PLANNED INTERVENTIONS: Therapeutic exercises, Therapeutic activity, Patient/Family education, Self Care, Dry Needling, Electrical stimulation, Spinal mobilization, Cryotherapy, Moist heat, Taping, Traction, Ultrasound, Ionotophoresis '4mg'$ /ml Dexamethasone, Manual therapy, and Re-evaluation  PLAN FOR NEXT SESSION: Review FOTO; assess response to HEP; progress therex as indicated; use of modalities, manual therapy; and TPDN as indicated.  Marylu Dudenhoeffer MS, PT 07/25/22 6:31 AM

## 2022-07-24 ENCOUNTER — Other Ambulatory Visit: Payer: Self-pay

## 2022-07-24 ENCOUNTER — Ambulatory Visit: Payer: 59 | Attending: Neurosurgery

## 2022-07-24 DIAGNOSIS — M6281 Muscle weakness (generalized): Secondary | ICD-10-CM | POA: Diagnosis present

## 2022-07-24 DIAGNOSIS — R252 Cramp and spasm: Secondary | ICD-10-CM | POA: Diagnosis present

## 2022-07-24 DIAGNOSIS — M542 Cervicalgia: Secondary | ICD-10-CM | POA: Diagnosis present

## 2022-08-07 ENCOUNTER — Ambulatory Visit: Payer: 59 | Attending: Neurosurgery | Admitting: Physical Therapy

## 2022-08-07 DIAGNOSIS — M6281 Muscle weakness (generalized): Secondary | ICD-10-CM

## 2022-08-07 DIAGNOSIS — R252 Cramp and spasm: Secondary | ICD-10-CM

## 2022-08-07 DIAGNOSIS — M542 Cervicalgia: Secondary | ICD-10-CM | POA: Diagnosis present

## 2022-08-07 NOTE — Therapy (Signed)
OUTPATIENT PHYSICAL THERAPY TREATMENT NOTE   Patient Name: Brett Clark MRN: [id number] DOB:Jul 13, 1956, 67 y.o., male Today's Date: 08/07/2022  PCP: Maury Dus, MD   REFERRING PROVIDER: Kary Kos, MD END OF SESSION:   PT End of Session - 08/07/22 0757     Visit Number 2    Number of Visits 13    Date for PT Re-Evaluation 09/13/22    Authorization Type [employer] OTHER    PT Start Time 0800    PT Stop Time 0850    PT Time Calculation (min) 50 min             Past Medical History:  Diagnosis Date   Abnormal abdominal CT scan    a. Abnl lesion on kidney/liver noted on CT @ OSH [facility]) - no further details available.   Atypical mole 09/06/2003   Upper Lesion (moderate) (widershave)   Atypical mole 09/06/2003   Middle Back Lesion (moderate to severe) (excision)   Atypical mole 11/08/2003   Mid Low Back (moderate to severe) (excision)   Atypical mole 11/08/2003   Right Lower Shoulder (moderate)   Atypical mole 11/08/2003   Mid Upper Back (moderate)   Atypical mole 11/08/2003   Left Mid Back (moderate)   Atypical mole 11/08/2003   Mid Abdomen (moderate) (widershave)   Atypical mole 08/02/2004   Mid Lower Back (moderate)   Atypical mole 08/02/2004   Right Shoulder Sup (moderate) (widershave)   Atypical mole 08/02/2004   Right Shoulder Inf (moderate)   Atypical mole 08/02/2004   Right Scapula (moderate)   Atypical mole 06/22/2007   Left Shoulder (moderate to severe) Mindi Slicker)   Atypical mole 01/22/2005   Mid Back (slight to moderate) Mindi Slicker)   Atypical mole 09/28/2013   Right Lower Back (moderate to severe)   Atypical mole 06/04/2021   mod-severe-mid back (WS)   CAD (coronary artery disease)    a. 06/2014 NSTEMI/PCI: LM nl, LAD 80 (2.5x18 Xience DES), LCX nl, RCA nl, EF 40-45%.   Dyslipidemia    Headache    migraine and tension   Ischemic cardiomyopathy    a. 06/2014 EF 40-45% by LV gram;  b. 06/2014 Echo: EF 50-55%, mild LVH, distal  anterior, apical, and inferoapical wall motion abnormalities, DD, mild MR.   Myocardial infarction ([place]) 06/2014   Nodular basal cell carcinoma (BCC) 10/01/2017   Right Outer Forehead (excision)   Right renal mass    SCCA (squamous cell carcinoma) of skin 02/15/2020   Right Forearm-Posterior (well diff) [place] CURET CAUTERY 5FU   Tobacco abuse    Past Surgical History:  Procedure Laterality Date   BACK SURGERY  years ago   neck   CARDIAC CATHETERIZATION  06/23/2014   DES to mid LAD, single v disease   hydrocele surgery  15 years ago   LEFT HEART CATHETERIZATION WITH CORONARY ANGIOGRAM N/A 06/23/2014   Procedure: LEFT HEART CATHETERIZATION WITH CORONARY ANGIOGRAM;  Surgeon: Peter M Martinique, MD;  Location: [facility] CATH LAB;  Service: Cardiovascular;  Laterality: N/A;   ROBOTIC ASSITED PARTIAL NEPHRECTOMY Right 01/18/2015   Procedure: ROBOTIC ASSITED PARTIAL NEPHRECTOMY, INTRAOPERATIVE ULTRASOUND, AND INDOCYANINE GREEN DYE;  Surgeon: Alexis Frock, MD;  Location: [facility];  Service: Urology;  Laterality: Right;   Patient Active Problem List   Diagnosis Date Noted   Nodular basal cell carcinoma 09/07/2018   Multiple atypical skin moles 09/07/2018   Impingement syndrome of right shoulder 03/31/2018   CKD (chronic kidney disease), stage III ([place]) 11/24/2017   Hypertension  11/18/2017   Renal cell carcinoma ([place]) 09/19/2015   CAD (coronary artery disease)    Hyperlipidemia    Ischemic cardiomyopathy    Tobacco abuse    CAD S/P PCI mLAD 2.5 x 18 mm Xience Alpine DES 06/24/2014    Class: Status post   Non-STEMI (non-ST elevated myocardial infarction) ([place]) 06/22/2014   Abnormal abdominal CT scan 06/22/2014   Epigastric abdominal tenderness 06/22/2014    REFERRING DIAG: M54.12 (ICD-10-CM) - Radiculopathy, cervical, C6   THERAPY DIAG:  Cervicalgia  Cramp and spasm  Muscle weakness (generalized)  Rationale for Evaluation and Treatment rehabilitation  PERTINENT HISTORY:  C3-4 fusion 2009;  CAD; ischemic cardiomyopathy  PRECAUTIONS: None  SUBJECTIVE:                                                                                                                                                                                      SUBJECTIVE STATEMENT:  Maybe a little better with the exercises over the last 2 weeks. I have pain every morning. It is usually the worse at the end of the day.    PAIN:  Are you having pain? Yes: NPRS scale: 5/10 Pain location: base of skull and upper neck  Pain description: ache, sharp, constant, wrose at end of day, and head feels heavy Aggravating factors: End of day, looking up and turning head Relieving factors: Ibuprofen, hot shower On eval: pain range: 2-10/10. Ave pain 6/10   OBJECTIVE: (objective measures completed at initial evaluation unless otherwise dated)   DIAGNOSTIC FINDINGS:  MRI cervical 08/01/18     IMPRESSION: 1. Slight progression of disc bulges and small central disc protrusions at C4-5 and C5-6 with slight increased indentation upon the thecal sac and cervical spinal cord at both levels but without myelopathy. 2. Stable myelopathy at C3-4 with no residual neural impingement.   Pt reports a MRI by Dr. Saintclair Halsted approx 3 months ago-unavailable in Epic   PATIENT SURVEYS:  FOTO: Perceived function   41%, predicted   53%    COGNITION: Overall cognitive status: Within functional limits for tasks assessed   SENSATION: WFL   POSTURE: rounded shoulders, decreased lumbar lordosis, and decreased thoracic kyphosis   PALPATION: TTP of the suboccipital muscles bilat R>L   CERVICAL ROM:    Active ROM A/PROM (deg) eval  Flexion 21 upper neck pulling  Extension 11 upper neck pressure pain  Right lateral flexion 10 R post/lat neck pain  Left lateral flexion 5 R post/lat nack pain  Right rotation 10 post/lat neck pain  Left rotation 20 blat upper shoulder pain   (Blank rows = not tested)   UPPER EXTREMITY ROM: Grossly  WNLs  and eqaul Active ROM Right eval Left eval  Shoulder flexion      Shoulder extension      Shoulder abduction      Shoulder adduction      Shoulder extension      Shoulder internal rotation      Shoulder external rotation      Elbow flexion      Elbow extension      Wrist flexion      Wrist extension      Wrist ulnar deviation      Wrist radial deviation      Wrist pronation      Wrist supination       (Blank rows = not tested)   UPPER EXTREMITY MMT: Myotome screen is negative. 5/5 strength and equal MMT Right eval Left eval  Shoulder flexion      Shoulder extension      Shoulder abduction      Shoulder adduction      Shoulder extension      Shoulder internal rotation      Shoulder external rotation      Middle trapezius      Lower trapezius      Elbow flexion      Elbow extension      Wrist flexion      Wrist extension      Wrist ulnar deviation      Wrist radial deviation      Wrist pronation      Wrist supination      Grip strength       (Blank rows = not tested)   CERVICAL SPECIAL TESTS:  Neck flexor muscle endurance test: Positive 3", Spurling's test: Negative, and Distraction test: Negative   FUNCTIONAL TESTS:  NT   TODAY'S TREATMENT:   [facility] Adult PT Treatment:                                                DATE: 08/07/22 Therapeutic Exercise: Seated chin tucks Seated upper trap stretches Seated levator stretches Supine chin tuck with towel DNF 2-3 sec x 8 Supine cervical rotation AROM to tolerance  Manual Therapy: STW to cervical paraspinals, SCM,Suboccipitals Modalities: HMP c-spine x 15 minutes  Self Care: Sub occipital release  using tennis balls for home trials                                                                                                                               [facility] Adult PT Treatment:                                                DATE: 07/24/22 Therapeutic Exercise:  Developed, instructed in, and pt completed  therex as noted in [place]: Heating pad as needed    PATIENT EDUCATION:  Education details: Eval findings, POC, HEP, self care  Person educated: Patient Education method: Explanation, Demonstration, Tactile cues, Verbal cues, and Handouts Education comprehension: verbalized understanding, returned demonstration, verbal cues required, and tactile cues required   HOME EXERCISE PROGRAM: Access Code: [email or web address] URL: [email or web address] Date: 07/24/2022 Prepared by: Gar Ponto   Exercises - Supine Cervical Retraction with Towel  - 2 x daily - 7 x weekly - 1 sets - 5 reps - 5 hold - Supine Deep Neck Flexor Training - Repetitions  - 1 x daily - 7 x weekly - 1 sets - 5 reps - 10 hold - Seated Passive Cervical Retraction  - 6 x daily - 7 x weekly - 1 sets - 3-5 reps - 5 hold - Seated Upper Trapezius Stretch  - 6 x daily - 7 x weekly - 1 sets - 3-5 reps - 15 hold - Standing Cervical Rotation AROM with Overpressure  - 6 x daily - 7 x weekly - 1 sets - 3-5 reps - 15 hold   ASSESSMENT:   CLINICAL IMPRESSION: Patient is a 67 y.o. male who was seen today for physical therapy treatment for M54.12 (ICD-10-CM) - Radiculopathy, cervical region . Pt reports compliance with HEP and minimal improvement. He arrives with 5/10 pain in side of neck and sub occipital area. Review of HEP was completed with pt requiring min cues and reminder of DNF exercise in supine. He has difficulty holding DNF 355 sec. Began manual STW today and education on suboccipital release techniques for self care. Followed with HMP for pain reduction and pain management education. At end of session pt reported some improvement in pain. He is receptive to [facility] as an intervention.     OBJECTIVE IMPAIRMENTS: decreased activity tolerance, decreased ROM, decreased strength, increased fascial restrictions, increased muscle spasms, postural dysfunction, and pain.    ACTIVITY LIMITATIONS: reach over head and looking up    PARTICIPATION LIMITATIONS: driving and occupation   PERSONAL FACTORS: Past/current experiences and Time since onset of injury/illness/exacerbation are also affecting patient's functional outcome.    REHAB POTENTIAL: Good   CLINICAL DECISION MAKING: Evolving/moderate complexity   EVALUATION COMPLEXITY: Moderate     GOALS:   SHORT TERM GOALS: Target date: 08/15/22   Pt will be Ind in an initial HEP Baseline: Initiated Goal status: INITIAL   2.  Pt will voice understanding of measures to assist in pain reduction  Baseline: initiated Goal status: INITIAL     LONG TERM GOALS: Target date: 09/13/22   Pt will be Ind in a final HEP to maintain achieved LOF Baseline: initiated Goal status: INITIAL   2.  Pt will report a decrease in neck pain range to 0- 4/10 for improved function and QOL  Baseline: 2-10/10 Goal status: INITIAL   3.  Pt's neck AROM will incrrease by 10d for each motion for improved function with work and driving Baseline: see flow sheets Goal status: INITIAL   4.  Pt's FOTO score will improved to the predicted value of 53% as indication of improved function  Baseline: 41% Goal status: INITIAL   PLAN:   PT FREQUENCY: 2x/week   PT DURATION: 6 weeks   PLANNED INTERVENTIONS: Therapeutic exercises, Therapeutic activity, Patient/Family education, Self Care, Dry Needling, Electrical stimulation, Spinal mobilization, Cryotherapy, Moist heat, Taping, Traction, Ultrasound, Ionotophoresis '4mg'$ /ml Dexamethasone, Manual therapy, and Re-evaluation   PLAN  FOR NEXT SESSION: recheck objective measure;  TPDN SCM/ sub occipitals; assess response to HEP; progress therex as indicated; use of modalities, manual therapy; and TPDN as indicated.     Hessie Diener, PTA 08/07/22 9:14 AM Phone: [phone] Fax: [phone]

## 2022-08-09 ENCOUNTER — Encounter: Payer: Self-pay | Admitting: Physical Therapy

## 2022-08-09 ENCOUNTER — Ambulatory Visit: Payer: 59 | Admitting: Physical Therapy

## 2022-08-09 DIAGNOSIS — R252 Cramp and spasm: Secondary | ICD-10-CM

## 2022-08-09 DIAGNOSIS — M542 Cervicalgia: Secondary | ICD-10-CM

## 2022-08-09 DIAGNOSIS — M6281 Muscle weakness (generalized): Secondary | ICD-10-CM

## 2022-08-09 NOTE — Therapy (Signed)
OUTPATIENT PHYSICAL THERAPY TREATMENT NOTE   Patient Name: Brett Clark MRN: [id number] DOB:Apr 11, 1956, 67 y.o., male Today's Date: 08/09/2022  PCP: Maury Dus, MD   REFERRING PROVIDER: Kary Kos, MD END OF SESSION:   PT End of Session - 08/09/22 0806     Visit Number 3    Number of Visits 13    Date for PT Re-Evaluation 09/13/22    Authorization Type [employer] OTHER    PT Start Time 0803    PT Stop Time 0855    PT Time Calculation (min) 52 min             Past Medical History:  Diagnosis Date   Abnormal abdominal CT scan    a. Abnl lesion on kidney/liver noted on CT @ OSH [facility]) - no further details available.   Atypical mole 09/06/2003   Upper Lesion (moderate) ([place])   Atypical mole 09/06/2003   Middle Back Lesion (moderate to severe) (excision)   Atypical mole 11/08/2003   Mid Low Back (moderate to severe) (excision)   Atypical mole 11/08/2003   Right Lower Shoulder (moderate)   Atypical mole 11/08/2003   Mid Upper Back (moderate)   Atypical mole 11/08/2003   Left Mid Back (moderate)   Atypical mole 11/08/2003   Mid Abdomen (moderate) (wid[place])   Atypical mole 08/02/2004   Mid Lower Back (moderate)   Atypical mole 08/02/2004   Right Shoulder Sup (moderate) ([place])   Atypical mole 08/02/2004   Right Shoulder Inf (moderate)   Atypical mole 08/02/2004   Right Scapula (moderate)   Atypical mole 06/22/2007   Left Shoulder (moderate to severe) Mindi Slicker)   Atypical mole 01/22/2005   Mid Back (slight to moderate) Mindi Slicker)   Atypical mole 09/28/2013   Right Lower Back (moderate to severe)   Atypical mole 06/04/2021   mod-severe-mid back ([place])   CAD (coronary artery disease)    a. 06/2014 NSTEMI/PCI: LM nl, LAD 80 (2.5x18 Xience DES), LCX nl, RCA nl, EF 40-45%.   Dyslipidemia    Headache    migraine and tension   Ischemic cardiomyopathy    a. 06/2014 EF 40-45% by LV gram;  b. 06/2014 Echo: EF 50-55%, mild LVH, distal  anterior, apical, and inferoapical wall motion abnormalities, DD, mild MR.   Myocardial infarction ([place]) 06/2014   Nodular basal cell carcinoma (BCC) 10/01/2017   Right Outer Forehead (excision)   Right renal mass    SCCA (squamous cell carcinoma) of skin 02/15/2020   Right Forearm-Posterior (well diff) [place] CURET CAUTERY 5FU   Tobacco abuse    Past Surgical History:  Procedure Laterality Date   BACK SURGERY  years ago   neck   CARDIAC CATHETERIZATION  06/23/2014   DES to mid LAD, single v disease   hydrocele surgery  15 years ago   LEFT HEART CATHETERIZATION WITH CORONARY ANGIOGRAM N/A 06/23/2014   Procedure: LEFT HEART CATHETERIZATION WITH CORONARY ANGIOGRAM;  Surgeon: Peter M Martinique, MD;  Location: [facility] CATH LAB;  Service: Cardiovascular;  Laterality: N/A;   ROBOTIC ASSITED PARTIAL NEPHRECTOMY Right 01/18/2015   Procedure: ROBOTIC ASSITED PARTIAL NEPHRECTOMY, INTRAOPERATIVE ULTRASOUND, AND INDOCYANINE GREEN DYE;  Surgeon: Alexis Frock, MD;  Location: [facility] ORS;  Service: Urology;  Laterality: Right;   Patient Active Problem List   Diagnosis Date Noted   Nodular basal cell carcinoma 09/07/2018   Multiple atypical skin moles 09/07/2018   Impingement syndrome of right shoulder 03/31/2018   CKD (chronic kidney disease), stage III ([place]) 11/24/2017   Hypertension  11/18/2017   Renal cell carcinoma ([place]) 09/19/2015   CAD (coronary artery disease)    Hyperlipidemia    Ischemic cardiomyopathy    Tobacco abuse    CAD S/P PCI mLAD 2.5 x 18 mm Xience [place] DES 06/24/2014    Class: Status post   Non-STEMI (non-ST elevated myocardial infarction) ([place]) 06/22/2014   Abnormal abdominal CT scan 06/22/2014   Epigastric abdominal tenderness 06/22/2014    REFERRING DIAG: M54.12 (ICD-10-CM) - Radiculopathy, cervical, C6   THERAPY DIAG:  Cervicalgia  Cramp and spasm  Muscle weakness (generalized)  Rationale for Evaluation and Treatment rehabilitation  PERTINENT HISTORY:  C3-4 fusion 2009;  CAD; ischemic cardiomyopathy  PRECAUTIONS: None  SUBJECTIVE:                                                                                                                                                                                      SUBJECTIVE STATEMENT:  I felt good after last session. I've been doing the new stretch a lot.   PAIN:  Are you having pain? Yes: NPRS scale: 5-6/10 Pain location: base of skull and upper neck  Pain description: ache, sharp, constant, wrose at end of day, and head feels heavy Aggravating factors: End of day, looking up and turning head Relieving factors: Ibuprofen, hot shower On eval: pain range: 2-10/10. Ave pain 6/10   OBJECTIVE: (objective measures completed at initial evaluation unless otherwise dated)   DIAGNOSTIC FINDINGS:  MRI cervical 08/01/18     IMPRESSION: 1. Slight progression of disc bulges and small central disc protrusions at C4-5 and C5-6 with slight increased indentation upon the thecal sac and cervical spinal cord at both levels but without myelopathy. 2. Stable myelopathy at C3-4 with no residual neural impingement.   Pt reports a MRI by Dr. Saintclair Halsted approx 3 months ago-unavailable in Epic   PATIENT SURVEYS:  FOTO: Perceived function   41%, predicted   53%    COGNITION: Overall cognitive status: Within functional limits for tasks assessed   SENSATION: WFL   POSTURE: rounded shoulders, decreased lumbar lordosis, and decreased thoracic kyphosis   PALPATION: TTP of the suboccipital muscles bilat R>L   CERVICAL ROM:    Active ROM A/PROM (deg) eval AROM after TPDN 08/09/22  Flexion 21 upper neck pulling 20  Extension 11 upper neck pressure pain 20  Right lateral flexion 10 R post/lat neck pain 18  Left lateral flexion 5 R post/lat nack pain 10  Right rotation 10 post/lat neck pain 20  Left rotation 20 blat upper shoulder pain 40   (Blank rows = not tested)   UPPER EXTREMITY ROM: Grossly WNLs and eqaul Active ROM  Right  eval Left eval  Shoulder flexion      Shoulder extension      Shoulder abduction      Shoulder adduction      Shoulder extension      Shoulder internal rotation      Shoulder external rotation      Elbow flexion      Elbow extension      Wrist flexion      Wrist extension      Wrist ulnar deviation      Wrist radial deviation      Wrist pronation      Wrist supination       (Blank rows = not tested)   UPPER EXTREMITY MMT: Myotome screen is negative. 5/5 strength and equal MMT Right eval Left eval  Shoulder flexion      Shoulder extension      Shoulder abduction      Shoulder adduction      Shoulder extension      Shoulder internal rotation      Shoulder external rotation      Middle trapezius      Lower trapezius      Elbow flexion      Elbow extension      Wrist flexion      Wrist extension      Wrist ulnar deviation      Wrist radial deviation      Wrist pronation      Wrist supination      Grip strength       (Blank rows = not tested)   CERVICAL SPECIAL TESTS:  Neck flexor muscle endurance test: Positive 3", Spurling's test: Negative, and Distraction test: Negative   FUNCTIONAL TESTS:  NT   TODAY'S TREATMENT:  [facility] Adult PT Treatment:                                                DATE: 08/09/22 Therapeutic Exercise: SCM stretch 10 sec x 3 each way Levator stretches 10 sec a 3 each way Chin tucks 5 sec x 10 Manual Therapy: STW following TPDN to cervical paraspinals.sub occipitals  Modalities: HMP x10 minutes   Trigger Point Dry-Needling  Treatment instructions: Expect mild to moderate muscle soreness. S/S of pneumothorax if dry needled over a lung field, and to seek immediate medical attention should they occur. Patient verbalized understanding of these instructions and education.  Patient Consent Given: Yes Education handout provided: Yes Muscles treated: right SCM, cervical multifidi C1- C2 bilat; sub occipitals bilat Electrical stimulation  performed: No Parameters: N/A Treatment response/outcome: twitch response     [facility] Adult PT Treatment:                                                DATE: 08/07/22 Therapeutic Exercise: Seated chin tucks Seated upper trap stretches Seated levator stretches Supine chin tuck with towel DNF 2-3 sec x 8 Supine cervical rotation AROM to tolerance  Manual Therapy: STW to cervical paraspinals, SCM,Suboccipitals Modalities: HMP c-spine x 15 minutes  Self Care: Sub occipital release  using tennis balls for home trials  [facility] Adult PT Treatment:                                                DATE: 07/24/22 Therapeutic Exercise: Developed, instructed in, and pt completed therex as noted in HEP  Self Care: Heating pad as needed    PATIENT EDUCATION:  Education details: Eval findings, POC, HEP, self care  Person educated: Patient Education method: Explanation, Demonstration, Tactile cues, Verbal cues, and Handouts Education comprehension: verbalized understanding, returned demonstration, verbal cues required, and tactile cues required   HOME EXERCISE PROGRAM: Access Code: [email or web address] URL: [email or web address] Date: 07/24/2022 Prepared by: Gar Ponto   Exercises - Supine Cervical Retraction with Towel  - 2 x daily - 7 x weekly - 1 sets - 5 reps - 5 hold - Supine Deep Neck Flexor Training - Repetitions  - 1 x daily - 7 x weekly - 1 sets - 5 reps - 10 hold - Seated Passive Cervical Retraction  - 6 x daily - 7 x weekly - 1 sets - 3-5 reps - 5 hold - Seated Upper Trapezius Stretch  - 6 x daily - 7 x weekly - 1 sets - 3-5 reps - 15 hold - Standing Cervical Rotation AROM with Overpressure  - 6 x daily - 7 x weekly - 1 sets - 3-5 reps - 15 hold Added 08/07/22 - Gentle Levator Scapulae Stretch  - 1 x daily - 7 x weekly - 1 sets - 3-5 reps - 15 hold Added  08/09/22 - Sternocleidomastoid Stretch  - 1 x daily - 7 x weekly - 1 sets - 3 reps - 15-20 hold    ASSESSMENT:   CLINICAL IMPRESSION: Patient is a 67 y.o. male who was seen today for physical therapy treatment for M54.12 (ICD-10-CM) - Radiculopathy, cervical region . Pt reports compliance with HEP and some improvement in pain after last session. His ROM remains stiff and he is agreeable to trial of TPDN today to reduce muscle tension. Carlus Pavlov, DPT was available and treated patients RT SCM , Sub occipital and cervical multifidi bilateral. AROM measurements taken post TPDN and showed improved ROM in most planes except cervical flexion. Reviewed cervical stretches and added SCM stretch. Manual STW performed postTPDN as well and HMP.  He was given updated HEP and TPDN information handout. At end of session, he was pleased with his improved ROM and was feeling sore from the TPDN.     OBJECTIVE IMPAIRMENTS: decreased activity tolerance, decreased ROM, decreased strength, increased fascial restrictions, increased muscle spasms, postural dysfunction, and pain.    ACTIVITY LIMITATIONS: reach over head and looking up   PARTICIPATION LIMITATIONS: driving and occupation   PERSONAL FACTORS: Past/current experiences and Time since onset of injury/illness/exacerbation are also affecting patient's functional outcome.    REHAB POTENTIAL: Good   CLINICAL DECISION MAKING: Evolving/moderate complexity   EVALUATION COMPLEXITY: Moderate     GOALS:   SHORT TERM GOALS: Target date: 08/15/22   Pt will be Ind in an initial HEP Baseline: Initiated Goal status: INITIAL   2.  Pt will voice understanding of measures to assist in pain reduction  Baseline: initiated Goal status: INITIAL     LONG TERM GOALS: Target date: 09/13/22   Pt will be Ind in a final HEP to maintain achieved LOF Baseline: initiated Goal status: INITIAL   2.  Pt will report  a decrease in neck pain range to 0- 4/10 for improved  function and QOL  Baseline: 2-10/10 Goal status: INITIAL   3.  Pt's neck AROM will incrrease by 10d for each motion for improved function with work and driving Baseline: see flow sheets Goal status: INITIAL   4.  Pt's FOTO score will improved to the predicted value of 53% as indication of improved function  Baseline: 41% Goal status: INITIAL   PLAN:   PT FREQUENCY: 2x/week   PT DURATION: 6 weeks   PLANNED INTERVENTIONS: Therapeutic exercises, Therapeutic activity, Patient/Family education, Self Care, Dry Needling, Electrical stimulation, Spinal mobilization, Cryotherapy, Moist heat, Taping, Traction, Ultrasound, Ionotophoresis '4mg'$ /ml Dexamethasone, Manual therapy, and Re-evaluation   PLAN FOR NEXT SESSION: recheck objective measure;  TPDN SCM/ sub occipitals; assess response to HEP; progress therex as indicated; use of modalities, manual therapy; and TPDN as indicated.     Hessie Diener, PTA 08/09/22 9:53 AM Phone: [phone] Fax: [phone]

## 2022-08-09 NOTE — Patient Instructions (Signed)

## 2022-08-13 NOTE — Therapy (Signed)
OUTPATIENT PHYSICAL THERAPY TREATMENT NOTE   Patient Name: Brett Clark MRN: [id number] DOB:1955/11/07, 67 y.o., male Today's Date: 08/13/2022  PCP: Maury Dus, MD   REFERRING PROVIDER: Kary Kos, MD END OF SESSION:     Past Medical History:  Diagnosis Date   Abnormal abdominal CT scan    a. Abnl lesion on kidney/liver noted on CT @ OSH [facility]) - no further details available.   Atypical mole 09/06/2003   Upper Lesion (moderate) (widershave)   Atypical mole 09/06/2003   Middle Back Lesion (moderate to severe) (excision)   Atypical mole 11/08/2003   Mid Low Back (moderate to severe) (excision)   Atypical mole 11/08/2003   Right Lower Shoulder (moderate)   Atypical mole 11/08/2003   Mid Upper Back (moderate)   Atypical mole 11/08/2003   Left Mid Back (moderate)   Atypical mole 11/08/2003   Mid Abdomen (moderate) (widershave)   Atypical mole 08/02/2004   Mid Lower Back (moderate)   Atypical mole 08/02/2004   Right Shoulder Sup (moderate) (widershave)   Atypical mole 08/02/2004   Right Shoulder Inf (moderate)   Atypical mole 08/02/2004   Right Scapula (moderate)   Atypical mole 06/22/2007   Left Shoulder (moderate to severe) Mindi Slicker)   Atypical mole 01/22/2005   Mid Back (slight to moderate) Mindi Slicker)   Atypical mole 09/28/2013   Right Lower Back (moderate to severe)   Atypical mole 06/04/2021   mod-severe-mid back (WS)   CAD (coronary artery disease)    a. 06/2014 NSTEMI/PCI: LM nl, LAD 80 (2.5x18 Xience DES), LCX nl, RCA nl, EF 40-45%.   Dyslipidemia    Headache    migraine and tension   Ischemic cardiomyopathy    a. 06/2014 EF 40-45% by LV gram;  b. 06/2014 Echo: EF 50-55%, mild LVH, distal anterior, apical, and inferoapical wall motion abnormalities, DD, mild MR.   Myocardial infarction ([place]) 06/2014   Nodular basal cell carcinoma (BCC) 10/01/2017   Right Outer Forehead (excision)   Right renal mass    SCCA (squamous cell carcinoma) of skin  02/15/2020   Right Forearm-Posterior (well diff) [place] CURET CAUTERY 5FU   Tobacco abuse    Past Surgical History:  Procedure Laterality Date   BACK SURGERY  years ago   neck   CARDIAC CATHETERIZATION  06/23/2014   DES to mid LAD, single v disease   hydrocele surgery  15 years ago   LEFT HEART CATHETERIZATION WITH CORONARY ANGIOGRAM N/A 06/23/2014   Procedure: LEFT HEART CATHETERIZATION WITH CORONARY ANGIOGRAM;  Surgeon: Peter M Martinique, MD;  Location: [facility] CATH LAB;  Service: Cardiovascular;  Laterality: N/A;   ROBOTIC ASSITED PARTIAL NEPHRECTOMY Right 01/18/2015   Procedure: ROBOTIC ASSITED PARTIAL NEPHRECTOMY, INTRAOPERATIVE ULTRASOUND, AND INDOCYANINE GREEN DYE;  Surgeon: Alexis Frock, MD;  Location: [facility] ORS;  Service: Urology;  Laterality: Right;   Patient Active Problem List   Diagnosis Date Noted   Nodular basal cell carcinoma 09/07/2018   Multiple atypical skin moles 09/07/2018   Impingement syndrome of right shoulder 03/31/2018   CKD (chronic kidney disease), stage III ([place]) 11/24/2017   Hypertension 11/18/2017   Renal cell carcinoma ([place]) 09/19/2015   CAD (coronary artery disease)    Hyperlipidemia    Ischemic cardiomyopathy    Tobacco abuse    CAD S/P PCI mLAD 2.5 x 18 mm Xience Alpine DES 06/24/2014    Class: Status post   Non-STEMI (non-ST elevated myocardial infarction) ([place]) 06/22/2014   Abnormal abdominal CT scan 06/22/2014   Epigastric abdominal tenderness  06/22/2014    REFERRING DIAG: M54.12 (ICD-10-CM) - Radiculopathy, cervical, C6   THERAPY DIAG:  No diagnosis found.  Rationale for Evaluation and Treatment rehabilitation  PERTINENT HISTORY:  C3-4 fusion 2009; CAD; ischemic cardiomyopathy  PRECAUTIONS: None  SUBJECTIVE:                                                                                                                                                                                      SUBJECTIVE STATEMENT:  I felt good after last session.  I've been doing the new stretch a lot.   PAIN:  Are you having pain? Yes: NPRS scale: 5-6/10 Pain location: base of skull and upper neck  Pain description: ache, sharp, constant, wrose at end of day, and head feels heavy Aggravating factors: End of day, looking up and turning head Relieving factors: Ibuprofen, hot shower On eval: pain range: 2-10/10. Ave pain 6/10   OBJECTIVE: (objective measures completed at initial evaluation unless otherwise dated)   DIAGNOSTIC FINDINGS:  MRI cervical 08/01/18     IMPRESSION: 1. Slight progression of disc bulges and small central disc protrusions at C4-5 and C5-6 with slight increased indentation upon the thecal sac and cervical spinal cord at both levels but without myelopathy. 2. Stable myelopathy at C3-4 with no residual neural impingement.   Pt reports a MRI by Dr. Saintclair Halsted approx 3 months ago-unavailable in Epic   PATIENT SURVEYS:  FOTO: Perceived function   41%, predicted   53%    COGNITION: Overall cognitive status: Within functional limits for tasks assessed   SENSATION: WFL   POSTURE: rounded shoulders, decreased lumbar lordosis, and decreased thoracic kyphosis   PALPATION: TTP of the suboccipital muscles bilat R>L   CERVICAL ROM:    Active ROM A/PROM (deg) eval AROM after TPDN 08/09/22  Flexion 21 upper neck pulling 20  Extension 11 upper neck pressure pain 20  Right lateral flexion 10 R post/lat neck pain 18  Left lateral flexion 5 R post/lat nack pain 10  Right rotation 10 post/lat neck pain 20  Left rotation 20 blat upper shoulder pain 40   (Blank rows = not tested)   UPPER EXTREMITY ROM: Grossly WNLs and eqaul Active ROM Right eval Left eval  Shoulder flexion      Shoulder extension      Shoulder abduction      Shoulder adduction      Shoulder extension      Shoulder internal rotation      Shoulder external rotation      Elbow flexion      Elbow extension      Wrist flexion      Wrist  extension      Wrist  ulnar deviation      Wrist radial deviation      Wrist pronation      Wrist supination       (Blank rows = not tested)   UPPER EXTREMITY MMT: Myotome screen is negative. 5/5 strength and equal MMT Right eval Left eval  Shoulder flexion      Shoulder extension      Shoulder abduction      Shoulder adduction      Shoulder extension      Shoulder internal rotation      Shoulder external rotation      Middle trapezius      Lower trapezius      Elbow flexion      Elbow extension      Wrist flexion      Wrist extension      Wrist ulnar deviation      Wrist radial deviation      Wrist pronation      Wrist supination      Grip strength       (Blank rows = not tested)   CERVICAL SPECIAL TESTS:  Neck flexor muscle endurance test: Positive 3", Spurling's test: Negative, and Distraction test: Negative   FUNCTIONAL TESTS:  NT   TODAY'S TREATMENT:  [facility] Adult PT Treatment:                                                DATE: 08/09/22 Therapeutic Exercise: SCM stretch 10 sec x 3 each way Levator stretches 10 sec a 3 each way Chin tucks 5 sec x 10 Manual Therapy: STW following TPDN to cervical paraspinals.sub occipitals  Modalities: HMP x10 minutes   Trigger Point Dry-Needling  Treatment instructions: Expect mild to moderate muscle soreness. S/S of pneumothorax if dry needled over a lung field, and to seek immediate medical attention should they occur. Patient verbalized understanding of these instructions and education.  Patient Consent Given: Yes Education handout provided: Yes Muscles treated: right SCM, cervical multifidi C1- C2 bilat; sub occipitals bilat Electrical stimulation performed: No Parameters: N/A Treatment response/outcome: twitch response     [facility] Adult PT Treatment:                                                DATE: 08/07/22 Therapeutic Exercise: Seated chin tucks Seated upper trap stretches Seated levator stretches Supine chin tuck with towel DNF 2-3  sec x 8 Supine cervical rotation AROM to tolerance  Manual Therapy: STW to cervical paraspinals, SCM,Suboccipitals Modalities: HMP c-spine x 15 minutes  Self Care: Sub occipital release  using tennis balls for home trials  [facility] Adult PT Treatment:                                                DATE: 07/24/22 Therapeutic Exercise: Developed, instructed in, and pt completed therex as noted in HEP  Self Care: Heating pad as needed    PATIENT EDUCATION:  Education details: Eval findings, POC, HEP, self care  Person educated: Patient Education method: Explanation, Demonstration, Tactile cues, Verbal cues, and Handouts Education comprehension: verbalized understanding, returned demonstration, verbal cues required, and tactile cues required   HOME EXERCISE PROGRAM: Access Code: [email or web address] URL: [email or web address] Date: 07/24/2022 Prepared by: Gar Ponto   Exercises - Supine Cervical Retraction with Towel  - 2 x daily - 7 x weekly - 1 sets - 5 reps - 5 hold - Supine Deep Neck Flexor Training - Repetitions  - 1 x daily - 7 x weekly - 1 sets - 5 reps - 10 hold - Seated Passive Cervical Retraction  - 6 x daily - 7 x weekly - 1 sets - 3-5 reps - 5 hold - Seated Upper Trapezius Stretch  - 6 x daily - 7 x weekly - 1 sets - 3-5 reps - 15 hold - Standing Cervical Rotation AROM with Overpressure  - 6 x daily - 7 x weekly - 1 sets - 3-5 reps - 15 hold Added 08/07/22 - Gentle Levator Scapulae Stretch  - 1 x daily - 7 x weekly - 1 sets - 3-5 reps - 15 hold Added 08/09/22 - Sternocleidomastoid Stretch  - 1 x daily - 7 x weekly - 1 sets - 3 reps - 15-20 hold    ASSESSMENT:   CLINICAL IMPRESSION: Patient is a 67 y.o. male who was seen today for physical therapy treatment for M54.12 (ICD-10-CM) - Radiculopathy, cervical region . Pt reports compliance with HEP and  some improvement in pain after last session. His ROM remains stiff and he is agreeable to trial of TPDN today to reduce muscle tension. Brett Clark, DPT was available and treated patients RT SCM , Sub occipital and cervical multifidi bilateral. AROM measurements taken post TPDN and showed improved ROM in most planes except cervical flexion. Reviewed cervical stretches and added SCM stretch. Manual STW performed postTPDN as well and HMP.  He was given updated HEP and TPDN information handout. At end of session, he was pleased with his improved ROM and was feeling sore from the TPDN.     OBJECTIVE IMPAIRMENTS: decreased activity tolerance, decreased ROM, decreased strength, increased fascial restrictions, increased muscle spasms, postural dysfunction, and pain.    ACTIVITY LIMITATIONS: reach over head and looking up   PARTICIPATION LIMITATIONS: driving and occupation   PERSONAL FACTORS: Past/current experiences and Time since onset of injury/illness/exacerbation are also affecting patient's functional outcome.    REHAB POTENTIAL: Good   CLINICAL DECISION MAKING: Evolving/moderate complexity   EVALUATION COMPLEXITY: Moderate     GOALS:   SHORT TERM GOALS: Target date: 08/15/22   Pt will be Ind in an initial HEP Baseline: Initiated Goal status: INITIAL   2.  Pt will voice understanding of measures to assist in pain reduction  Baseline: initiated Goal status: INITIAL     LONG TERM GOALS: Target date: 09/13/22   Pt will be Ind in a final HEP to maintain achieved LOF Baseline: initiated Goal status: INITIAL   2.  Pt will report  a decrease in neck pain range to 0- 4/10 for improved function and QOL  Baseline: 2-10/10 Goal status: INITIAL   3.  Pt's neck AROM will incrrease by 10d for each motion for improved function with work and driving Baseline: see flow sheets Goal status: INITIAL   4.  Pt's FOTO score will improved to the predicted value of 53% as indication of improved  function  Baseline: 41% Goal status: INITIAL   PLAN:   PT FREQUENCY: 2x/week   PT DURATION: 6 weeks   PLANNED INTERVENTIONS: Therapeutic exercises, Therapeutic activity, Patient/Family education, Self Care, Dry Needling, Electrical stimulation, Spinal mobilization, Cryotherapy, Moist heat, Taping, Traction, Ultrasound, Ionotophoresis '4mg'$ /ml Dexamethasone, Manual therapy, and Re-evaluation   PLAN FOR NEXT SESSION: recheck objective measure;  TPDN SCM/ sub occipitals; assess response to HEP; progress therex as indicated; use of modalities, manual therapy; and TPDN as indicated.     Hessie Diener, PTA 08/13/22 7:45 PM Phone: [phone] Fax: [phone]      OUTPATIENT PHYSICAL THERAPY TREATMENT NOTE   Patient Name: Brett Clark MRN: [id number] DOB:24-Sep-1955, 67 y.o., male Today's Date: 08/09/2022  PCP: Maury Dus, MD   REFERRING PROVIDER: Kary Kos, MD END OF SESSION:   PT End of Session - 08/09/22 0806     Visit Number 3    Number of Visits 13    Date for PT Re-Evaluation 09/13/22    Authorization Type [employer] OTHER    PT Start Time 0803    PT Stop Time 0855    PT Time Calculation (min) 52 min             Past Medical History:  Diagnosis Date   Abnormal abdominal CT scan    a. Abnl lesion on kidney/liver noted on CT @ OSH [facility]) - no further details available.   Atypical mole 09/06/2003   Upper Lesion (moderate) (widershave)   Atypical mole 09/06/2003   Middle Back Lesion (moderate to severe) (excision)   Atypical mole 11/08/2003   Mid Low Back (moderate to severe) (excision)   Atypical mole 11/08/2003   Right Lower Shoulder (moderate)   Atypical mole 11/08/2003   Mid Upper Back (moderate)   Atypical mole 11/08/2003   Left Mid Back (moderate)   Atypical mole 11/08/2003   Mid Abdomen (moderate) (widershave)   Atypical mole 08/02/2004   Mid Lower Back (moderate)   Atypical mole 08/02/2004   Right Shoulder Sup (moderate)  (widershave)   Atypical mole 08/02/2004   Right Shoulder Inf (moderate)   Atypical mole 08/02/2004   Right Scapula (moderate)   Atypical mole 06/22/2007   Left Shoulder (moderate to severe) Mindi Slicker)   Atypical mole 01/22/2005   Mid Back (slight to moderate) Mindi Slicker)   Atypical mole 09/28/2013   Right Lower Back (moderate to severe)   Atypical mole 06/04/2021   mod-severe-mid back (WS)   CAD (coronary artery disease)    a. 06/2014 NSTEMI/PCI: LM nl, LAD 80 (2.5x18 Xience DES), LCX nl, RCA nl, EF 40-45%.   Dyslipidemia    Headache    migraine and tension   Ischemic cardiomyopathy    a. 06/2014 EF 40-45% by LV gram;  b. 06/2014 Echo: EF 50-55%, mild LVH, distal anterior, apical, and inferoapical wall motion abnormalities, DD, mild MR.   Myocardial infarction ([place]) 06/2014   Nodular basal cell carcinoma (BCC) 10/01/2017   Right Outer Forehead (excision)   Right renal mass    SCCA (squamous cell carcinoma) of skin 02/15/2020   Right Forearm-Posterior (well  diff) [place] CURET CAUTERY 5FU   Tobacco abuse    Past Surgical History:  Procedure Laterality Date   BACK SURGERY  years ago   neck   CARDIAC CATHETERIZATION  06/23/2014   DES to mid LAD, single v disease   hydrocele surgery  15 years ago   LEFT HEART CATHETERIZATION WITH CORONARY ANGIOGRAM N/A 06/23/2014   Procedure: LEFT HEART CATHETERIZATION WITH CORONARY ANGIOGRAM;  Surgeon: Peter M Martinique, MD;  Location[facility] CATH LAB;  Service: Cardiovascular;  Laterality: N/A;   ROBOTIC ASSITED PARTIAL NEPHRECTOMY Right 01/18/2015   Procedure: ROBOTIC ASSITED PARTIAL NEPHRECTOMY, INTRAOPERATIVE ULTRASOUND, AND INDOCYANINE GREEN DYE;  Surgeon: Alexis Frock, MD;  Location: [facility]S;  Service: Urology;  Laterality: Right;   Patient Active Problem List   Diagnosis Date Noted   Nodular basal cell carcinoma 09/07/2018   Multiple atypical skin moles 09/07/2018   Impingement syndrome of right shoulder 03/31/2018   CKD (chronic kidney  disease), stage III ([place]) 11/24/2017   Hypertension 11/18/2017   Renal cell carcinoma ([place]) 09/19/2015   CAD (coronary artery disease)    Hyperlipidemia    Ischemic cardiomyopathy    Tobacco abuse    CAD S/P PCI mLAD 2.5 x 18 mm Xience Alpine DES 06/24/2014    Class: Status post   Non-STEMI (non-ST elevated myocardial infarction) ([place]) 06/22/2014   Abnormal abdominal CT scan 06/22/2014   Epigastric abdominal tenderness 06/22/2014    REFERRING DIAG: M54.12 (ICD-10-CM) - Radiculopathy, cervical, C6   THERAPY DIAG:  Cervicalgia  Cramp and spasm  Muscle weakness (generalized)  Rationale for Evaluation and Treatment rehabilitation  PERTINENT HISTORY:  C3-4 fusion 2009; CAD; ischemic cardiomyopathy  PRECAUTIONS: None  SUBJECTIVE:                                                                                                                                                                                      SUBJECTIVE STATEMENT:  I felt good after last session. I've been doing the new stretch a lot.   PAIN:  Are you having pain? Yes: NPRS scale: 5-6/10 Pain location: base of skull and upper neck  Pain description: ache, sharp, constant, wrose at end of day, and head feels heavy Aggravating factors: End of day, looking up and turning head Relieving factors: Ibuprofen, hot shower On eval: pain range: 2-10/10. Ave pain 6/10   OBJECTIVE: (objective measures completed at initial evaluation unless otherwise dated)   DIAGNOSTIC FINDINGS:  MRI cervical 08/01/18     IMPRESSION: 1. Slight progression of disc bulges and small central disc protrusions at C4-5 and C5-6 with slight increased indentation upon the thecal sac and cervical spinal cord at both levels  but without myelopathy. 2. Stable myelopathy at C3-4 with no residual neural impingement.   Pt reports a MRI by Dr. Saintclair Halsted approx 3 months ago-unavailable in Epic   PATIENT SURVEYS:  FOTO: Perceived function   41%,  predicted   53%    COGNITION: Overall cognitive status: Within functional limits for tasks assessed   SENSATION: WFL   POSTURE: rounded shoulders, decreased lumbar lordosis, and decreased thoracic kyphosis   PALPATION: TTP of the suboccipital muscles bilat R>L   CERVICAL ROM:    Active ROM A/PROM (deg) eval AROM after TPDN 08/09/22  Flexion 21 upper neck pulling 20  Extension 11 upper neck pressure pain 20  Right lateral flexion 10 R post/lat neck pain 18  Left lateral flexion 5 R post/lat nack pain 10  Right rotation 10 post/lat neck pain 20  Left rotation 20 blat upper shoulder pain 40   (Blank rows = not tested)   UPPER EXTREMITY ROM: Grossly WNLs and eqaul Active ROM Right eval Left eval  Shoulder flexion      Shoulder extension      Shoulder abduction      Shoulder adduction      Shoulder extension      Shoulder internal rotation      Shoulder external rotation      Elbow flexion      Elbow extension      Wrist flexion      Wrist extension      Wrist ulnar deviation      Wrist radial deviation      Wrist pronation      Wrist supination       (Blank rows = not tested)   UPPER EXTREMITY MMT: Myotome screen is negative. 5/5 strength and equal MMT Right eval Left eval  Shoulder flexion      Shoulder extension      Shoulder abduction      Shoulder adduction      Shoulder extension      Shoulder internal rotation      Shoulder external rotation      Middle trapezius      Lower trapezius      Elbow flexion      Elbow extension      Wrist flexion      Wrist extension      Wrist ulnar deviation      Wrist radial deviation      Wrist pronation      Wrist supination      Grip strength       (Blank rows = not tested)   CERVICAL SPECIAL TESTS:  Neck flexor muscle endurance test: Positive 3", Spurling's test: Negative, and Distraction test: Negative   FUNCTIONAL TESTS:  NT   TODAY'S TREATMENT:  [facility] Adult PT Treatment:                                                 DATE: 08/13/22 Therapeutic Exercise: *** Manual Therapy: *** Neuromuscular re-ed: *** Therapeutic Activity: *** Modalities: *** Self Care: [facility] Adult PT Treatment:                                                DATE:  08/09/22 Therapeutic Exercise: SCM stretch 10 sec x 3 each way Levator stretches 10 sec a 3 each way Chin tucks 5 sec x 10 Manual Therapy: STW following TPDN to cervical paraspinals.sub occipitals  Modalities: HMP x10 minutes   Trigger Point Dry-Needling  Treatment instructions: Expect mild to moderate muscle soreness. S/S of pneumothorax if dry needled over a lung field, and to seek immediate medical attention should they occur. Patient verbalized understanding of these instructions and education.  Patient Consent Given: Yes Education handout provided: Yes Muscles treated: right SCM, cervical multifidi C1- C2 bilat; sub occipitals bilat Electrical stimulation performed: No Parameters: N/A Treatment response/outcome: twitch response     [facility] Adult PT Treatment:                                                DATE: 08/07/22 Therapeutic Exercise: Seated chin tucks Seated upper trap stretches Seated levator stretches Supine chin tuck with towel DNF 2-3 sec x 8 Supine cervical rotation AROM to tolerance  Manual Therapy: STW to cervical paraspinals, SCM,Suboccipitals Modalities: HMP c-spine x 15 minutes  Self Care: Sub occipital release  using tennis balls for home trials                                                                                                                               [facility] Adult PT Treatment:                                                DATE: 07/24/22 Therapeutic Exercise: Developed, instructed in, and pt completed therex as noted in HEP  Self Care: Heating pad as needed    PATIENT EDUCATION:  Education details: Eval findings, POC, HEP, self care  Person educated: Patient Education method: Explanation,  Demonstration, Tactile cues, Verbal cues, and Handouts Education comprehension: verbalized understanding, returned demonstration, verbal cues required, and tactile cues required   HOME EXERCISE PROGRAM: Access Code: [email or web address] URL: [email or web address] Date: 07/24/2022 Prepared by: Gar Ponto   Exercises - Supine Cervical Retraction with Towel  - 2 x daily - 7 x weekly - 1 sets - 5 reps - 5 hold - Supine Deep Neck Flexor Training - Repetitions  - 1 x daily - 7 x weekly - 1 sets - 5 reps - 10 hold - Seated Passive Cervical Retraction  - 6 x daily - 7 x weekly - 1 sets - 3-5 reps - 5 hold - Seated Upper Trapezius Stretch  - 6 x daily - 7 x weekly - 1 sets - 3-5 reps - 15 hold - Standing Cervical Rotation AROM with Overpressure  - 6 x daily - 7 x weekly - 1 sets - 3-5  reps - 15 hold Added 08/07/22 - Gentle Levator Scapulae Stretch  - 1 x daily - 7 x weekly - 1 sets - 3-5 reps - 15 hold Added 08/09/22 - Sternocleidomastoid Stretch  - 1 x daily - 7 x weekly - 1 sets - 3 reps - 15-20 hold    ASSESSMENT:   CLINICAL IMPRESSION: Patient is a 67 y.o. male who was seen today for physical therapy treatment for M54.12 (ICD-10-CM) - Radiculopathy, cervical region . Pt reports compliance with HEP and some improvement in pain after last session. His ROM remains stiff and he is agreeable to trial of TPDN today to reduce muscle tension. Brett Clark, DPT was available and treated patients RT SCM , Sub occipital and cervical multifidi bilateral. AROM measurements taken post TPDN and showed improved ROM in most planes except cervical flexion. Reviewed cervical stretches and added SCM stretch. Manual STW performed postTPDN as well and HMP.  He was given updated HEP and TPDN information handout. At end of session, he was pleased with his improved ROM and was feeling sore from the TPDN.     OBJECTIVE IMPAIRMENTS: decreased activity tolerance, decreased ROM, decreased strength, increased fascial  restrictions, increased muscle spasms, postural dysfunction, and pain.    ACTIVITY LIMITATIONS: reach over head and looking up   PARTICIPATION LIMITATIONS: driving and occupation   PERSONAL FACTORS: Past/current experiences and Time since onset of injury/illness/exacerbation are also affecting patient's functional outcome.    REHAB POTENTIAL: Good   CLINICAL DECISION MAKING: Evolving/moderate complexity   EVALUATION COMPLEXITY: Moderate     GOALS:   SHORT TERM GOALS: Target date: 08/15/22   Pt will be Ind in an initial HEP Baseline: Initiated Goal status: INITIAL   2.  Pt will voice understanding of measures to assist in pain reduction  Baseline: initiated Goal status: INITIAL     LONG TERM GOALS: Target date: 09/13/22   Pt will be Ind in a final HEP to maintain achieved LOF Baseline: initiated Goal status: INITIAL   2.  Pt will report a decrease in neck pain range to 0- 4/10 for improved function and QOL  Baseline: 2-10/10 Goal status: INITIAL   3.  Pt's neck AROM will incrrease by 10d for each motion for improved function with work and driving Baseline: see flow sheets Goal status: INITIAL   4.  Pt's FOTO score will improved to the predicted value of 53% as indication of improved function  Baseline: 41% Goal status: INITIAL   PLAN:   PT FREQUENCY: 2x/week   PT DURATION: 6 weeks   PLANNED INTERVENTIONS: Therapeutic exercises, Therapeutic activity, Patient/Family education, Self Care, Dry Needling, Electrical stimulation, Spinal mobilization, Cryotherapy, Moist heat, Taping, Traction, Ultrasound, Ionotophoresis '4mg'$ /ml Dexamethasone, Manual therapy, and Re-evaluation   PLAN FOR NEXT SESSION: recheck objective measure;  TPDN SCM/ sub occipitals; assess response to HEP; progress therex as indicated; use of modalities, manual therapy; and TPDN as indicated.   Brett Smejkal MS, PT 08/13/22 7:45 PM

## 2022-08-14 ENCOUNTER — Ambulatory Visit: Payer: 59

## 2022-08-14 DIAGNOSIS — R252 Cramp and spasm: Secondary | ICD-10-CM

## 2022-08-14 DIAGNOSIS — M6281 Muscle weakness (generalized): Secondary | ICD-10-CM

## 2022-08-14 DIAGNOSIS — M542 Cervicalgia: Secondary | ICD-10-CM

## 2022-08-15 NOTE — Therapy (Signed)
OUTPATIENT PHYSICAL THERAPY TREATMENT NOTE   Patient Name: Brett Clark MRN: [id number] DOB:[birth year], 67 y.o., male Today's Date: 08/16/2022  PCP: Maury Dus, MD   REFERRING PROVIDER: Kary Kos, MD END OF SESSION:   PT End of Session - 08/16/22 0726     Visit Number 5    Number of Visits 13    Date for PT Re-Evaluation 09/13/22    Authorization Type [employer] OTHER    PT Start Time [phone]    PT Stop Time 0802    PT Time Calculation (min) 44 min    Activity Tolerance Patient tolerated treatment well    Behavior During Therapy [facility] for tasks assessed/performed               Past Medical History:  Diagnosis Date   Abnormal abdominal CT scan    a. Abnl lesion on kidney/liver noted on CT @ [facility]) - no further details available.   Atypical mole 09/06/2003   Upper Lesion (moderate) (widershave)   Atypical mole 09/06/2003   Middle Back Lesion (moderate to severe) (excision)   Atypical mole 11/08/2003   Mid Low Back (moderate to severe) (excision)   Atypical mole 11/08/2003   Right Lower Shoulder (moderate)   Atypical mole 11/08/2003   Mid Upper Back (moderate)   Atypical mole 11/08/2003   Left Mid Back (moderate)   Atypical mole 11/08/2003   Mid Abdomen (moderate) (widershave)   Atypical mole 08/02/2004   Mid Lower Back (moderate)   Atypical mole 08/02/2004   Right Shoulder Sup (moderate) (widershave)   Atypical mole 08/02/2004   Right Shoulder Inf (moderate)   Atypical mole 08/02/2004   Right Scapula (moderate)   Atypical mole 06/22/2007   Left Shoulder (moderate to severe) Mindi Slicker)   Atypical mole 01/22/2005   Mid Back (slight to moderate) Mindi Slicker)   Atypical mole 09/28/2013   Right Lower Back (moderate to severe)   Atypical mole 06/04/2021   mod-severe-mid back ([place])   CAD (coronary artery disease)    a. 06/2014 NSTEMI/PCI: LM nl, LAD 80 (2.5x18 Xience DES), LCX nl, RCA nl, EF 40-45%.   Dyslipidemia    Headache    migraine  and tension   Ischemic cardiomyopathy    a. 06/2014 EF 40-45% by LV gram;  b. 06/2014 Echo: EF 50-55%, mild LVH, distal anterior, apical, and inferoapical wall motion abnormalities, DD, mild MR.   Myocardial infarction ([place]) 06/2014   Nodular basal cell carcinoma (BCC) 10/01/2017   Right Outer Forehead (excision)   Right renal mass    SCCA (squamous cell carcinoma) of skin 02/15/2020   Right Forearm-Posterior (well diff) [place] CURET CAUTERY 5FU   Tobacco abuse    Past Surgical History:  Procedure Laterality Date   BACK SURGERY  years ago   neck   CARDIAC CATHETERIZATION  06/23/2014   DES to mid LAD, single v disease   hydrocele surgery  15 years ago   LEFT HEART CATHETERIZATION WITH CORONARY ANGIOGRAM N/A 06/23/2014   Procedure: LEFT HEART CATHETERIZATION WITH CORONARY ANGIOGRAM;  Surgeon: Peter M Martinique, MD;  Location: [facility] CATH LAB;  Service: Cardiovascular;  Laterality: N/A;   ROBOTIC ASSITED PARTIAL NEPHRECTOMY Right 01/18/2015   Procedure: ROBOTIC ASSITED PARTIAL NEPHRECTOMY, INTRAOPERATIVE ULTRASOUND, AND INDOCYANINE GREEN DYE;  Surgeon: Alexis Frock, MD;  Location: [facility] ORS;  Service: Urology;  Laterality: Right;   Patient Active Problem List   Diagnosis Date Noted   Nodular basal cell carcinoma 09/07/2018   Multiple atypical skin moles 09/07/2018  Impingement syndrome of right shoulder 03/31/2018   CKD (chronic kidney disease), stage III ([place]) 11/24/2017   Hypertension 11/18/2017   Renal cell carcinoma ([place]) 09/19/2015   CAD (coronary artery disease)    Hyperlipidemia    Ischemic cardiomyopathy    Tobacco abuse    CAD S/P PCI mLAD 2.5 x 18 mm Xience [place] DES 06/24/2014    Class: Status post   Non-STEMI (non-ST elevated myocardial infarction) ([place]) 06/22/2014   Abnormal abdominal CT scan 06/22/2014   Epigastric abdominal tenderness 06/22/2014    REFERRING DIAG: M54.12 (ICD-10-CM) - Radiculopathy, cervical, C6   THERAPY DIAG:  Cervicalgia  Cramp and spasm  Muscle  weakness (generalized)  Rationale for Evaluation and Treatment rehabilitation  PERTINENT HISTORY:  C3-4 fusion 2009; CAD; ischemic cardiomyopathy  PRECAUTIONS: None  SUBJECTIVE:                                                                                                                                                                                      SUBJECTIVE STATEMENT: Pt reports his neck pain is about the same. He notes he has not recently experienced a high level of pain which can occur intermittently. 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 It also involves a lot of head turning.  PAIN:  Are you having pain? Yes: NPRS scale: 4-5/10 Pain location: base of skull and upper neck  Pain description: ache, sharp, constant, wrose at end of day, and head feels heavy Aggravating factors: End of day, looking up and turning head Relieving factors: Ibuprofen, hot shower On eval: pain range: 2-10/10. Ave pain 6/10   OBJECTIVE: (objective measures completed at initial evaluation unless otherwise dated)   DIAGNOSTIC FINDINGS:  MRI cervical 08/01/18     IMPRESSION: 1. Slight progression of disc bulges and small central disc protrusions at C4-5 and C5-6 with slight increased indentation upon the thecal sac and cervical spinal cord at both levels but without myelopathy. 2. Stable myelopathy at C3-4 with no residual neural impingement.   Pt reports a MRI by Dr. Saintclair Halsted approx 3 months ago-unavailable in Epic   PATIENT SURVEYS:  FOTO: Perceived function   41%, predicted   53%    COGNITION: Overall cognitive status: Within functional limits for tasks assessed   SENSATION: WFL   POSTURE: rounded shoulders, decreased lumbar lordosis, and decreased thoracic kyphosis   PALPATION: TTP of the suboccipital muscles bilat R>L   CERVICAL ROM:    Active ROM A/PROM (deg) eval AROM after  TPDN 08/09/22 AROM after TPDN 08/09/22  Flexion 21 upper neck pulling 20  Extension 11 upper neck pressure pain 20   Right lateral flexion 10 R post/lat neck pain 18   Left lateral flexion 5 R post/lat nack pain 10   Right rotation 10 post/lat neck pain 20 30  Left rotation 20 blat upper shoulder pain 40 40   (Blank rows = not tested)   UPPER EXTREMITY ROM: Grossly WNLs and eqaul Active ROM Right eval Left eval  Shoulder flexion      Shoulder extension      Shoulder abduction      Shoulder adduction      Shoulder extension      Shoulder internal rotation      Shoulder external rotation      Elbow flexion      Elbow extension      Wrist flexion      Wrist extension      Wrist ulnar deviation      Wrist radial deviation      Wrist pronation      Wrist supination       (Blank rows = not tested)   UPPER EXTREMITY MMT: Myotome screen is negative. 5/5 strength and equal MMT Right eval Left eval  Shoulder flexion      Shoulder extension      Shoulder abduction      Shoulder adduction      Shoulder extension      Shoulder internal rotation      Shoulder external rotation      Middle trapezius      Lower trapezius      Elbow flexion      Elbow extension      Wrist flexion      Wrist extension      Wrist ulnar deviation      Wrist radial deviation      Wrist pronation      Wrist supination      Grip strength       (Blank rows = not tested)   CERVICAL SPECIAL TESTS:  Neck flexor muscle endurance test: Positive 3", Spurling's test: Negative, and Distraction test: Negative   FUNCTIONAL TESTS:  NT   TODAY'S TREATMENT:  [facility] Adult PT Treatment:                                                DATE: 08/16/22 Therapeutic Exercise: seated chin tuck x5 3" Seated upper trap stretch x2 15' each Seated chin tuck c side bending x2 15" each Seated chin tuck c rotation x2 15" each Shoulder hor abd star pattern 2x5 patterns GTB Shoulder ER  2x10 GTB Manual Therapy: STM to  the suboccipitals, cervical paraspinals, and upper traps bilat Suboccipital release UPA C2-C5 Contact/relax cervical side bending ROM Skilled palpation to identify TrPs and taut muscle bands  Trigger Point Dry Needling Treatment: Pre-treatment instruction: Patient instructed on dry needling rationale, procedures, and possible side effects including pain during treatment (achy,cramping feeling), bruising, drop of blood, lightheadedness, nausea, sweating. Patient Consent Given: Yes Education handout provided: Previously provided Muscles treated: upper traps bliat  Needle size and number: .30x89m x 2;  .30x31mx 2 Electrical stimulation performed: No Parameters: N/A Treatment response/outcome: Muscle twitch and increased muscle lengthening Post-treatment instructions: Patient instructed to expect possible mild to moderate muscle soreness later today and/or tomorrow. Patient instructed in methods to reduce muscle soreness and to continue  prescribed HEP. If patient was dry needled over the lung field, patient was instructed on signs and symptoms of pneumothorax and, however unlikely, to see immediate medical attention should they occur. Patient was also educated on signs and symptoms of infection and to seek medical attention should they occur. Patient verbalized understanding of these instructions and education.   [facility] Adult PT Treatment:                                                DATE: 08/14/22 Therapeutic Exercise: Supine chin tuck x5 3" Supine lift offs x5 5" Seated upper trap stretch x2 15' each Seated chin tuck c side bending x2 15" each Seated chin tuck c rotation x2 15" each Shoulder hor abd star pattern x2 5 GTB Manual Therapy: STM to the suboccipitals, cervical paraspinals, and upper traps bilat UPA C2-C5 Contact/relax cervical side bending Skilled palpation to identify TrPs and taut muscle bands Trigger Point Dry Needling Treatment: Pre-treatment instruction: Patient  instructed on dry needling rationale, procedures, and possible side effects including pain during treatment (achy,cramping feeling), bruising, drop of blood, lightheadedness, nausea, sweating. Patient Consent Given: Yes Education handout provided: Previously provided Muscles treated: upper traps bliat  Needle size and number: .30x69m x 1 Electrical stimulation performed: No Parameters: N/A Treatment response/outcome: Muscle twitch and increased muscle lengthening Post-treatment instructions: Patient instructed to expect possible mild to moderate muscle soreness later today and/or tomorrow. Patient instructed in methods to reduce muscle soreness and to continue prescribed HEP. If patient was dry needled over the lung field, patient was instructed on signs and symptoms of pneumothorax and, however unlikely, to see immediate medical attention should they occur. Patient was also educated on signs and symptoms of infection and to seek medical attention should they occur. Patient verbalized understanding of these instructions and education.    [facility]:                                                DATE: 08/09/22 Therapeutic Exercise: SCM stretch 10 sec x 3 each way Levator stretches 10 sec a 3 each way Chin tucks 5 sec x 10 Manual Therapy: STW following TPDN to cervical paraspinals.sub occipitals  Modalities: HMP x10 minutes   Trigger Point Dry-Needling  Treatment instructions: Expect mild to moderate muscle soreness. S/S of pneumothorax if dry needled over a lung field, and to seek immediate medical attention should they occur. Patient verbalized understanding of these instructions and education.  Patient Consent Given: Yes Education handout provided: Yes Muscles treated: right SCM, cervical multifidi C1- C2 bilat; sub occipitals bilat Electrical stimulation performed: No Parameters: N/A Treatment response/outcome: twitch response     PATIENT EDUCATION:  Education details:  Eval findings, POC, HEP, self care  Person educated: Patient Education method: Explanation, Demonstration, Tactile cues, Verbal cues, and Handouts Education comprehension: verbalized understanding, returned demonstration, verbal cues required, and tactile cues required   HOME EXERCISE PROGRAM: Access Code: [email or web address] URL: [email or web address] Date: 07/24/2022 Prepared by: AGar Ponto  Exercises - Supine Cervical Retraction with Towel  - 2 x daily - 7 x weekly - 1 sets - 5 reps - 5 hold - Supine Deep Neck Flexor Training - Repetitions  - 1 x daily -  7 x weekly - 1 sets - 5 reps - 10 hold - Seated Passive Cervical Retraction  - 6 x daily - 7 x weekly - 1 sets - 3-5 reps - 5 hold - Seated Upper Trapezius Stretch  - 6 x daily - 7 x weekly - 1 sets - 3-5 reps - 15 hold - Standing Cervical Rotation AROM with Overpressure  - 6 x daily - 7 x weekly - 1 sets - 3-5 reps - 15 hold Added 08/07/22 - Gentle Levator Scapulae Stretch  - 1 x daily - 7 x weekly - 1 sets - 3-5 reps - 15 hold Added 08/09/22 - Sternocleidomastoid Stretch  - 1 x daily - 7 x weekly - 1 sets - 3 reps - 15-20 hold    ASSESSMENT:   CLINICAL IMPRESSION: PT was completed for manual therapy as noted above. Pt reports the most relief during manual therapy from traction when applied with suboccipital release. TPDN was then completed to the suboccipital and upper cervical paraspinals f/b flexibility and strengthening therex. Pt completed the therex with proper technique. Pt tolerated PT today without adverse effects. Pt reports he will be completing more strenuous activities with work next week which will be an indicator as to the benefit of PT.  OBJECTIVE IMPAIRMENTS: decreased activity tolerance, decreased ROM, decreased strength, increased fascial restrictions, increased muscle spasms, postural dysfunction, and pain.    ACTIVITY LIMITATIONS: reach over head and looking up   PARTICIPATION LIMITATIONS: driving and  occupation   PERSONAL FACTORS: Past/current experiences and Time since onset of injury/illness/exacerbation are also affecting patient's functional outcome.    REHAB POTENTIAL: Good   CLINICAL DECISION MAKING: Evolving/moderate complexity   EVALUATION COMPLEXITY: Moderate     GOALS:   SHORT TERM GOALS: Target date: 08/15/22   Pt will be Ind in an initial HEP Baseline: Initiated Goal status: 08/16/22 MET   2.  Pt will voice understanding of measures to assist in pain reduction  Baseline: initiated Goal status: 08/16/22 MET     LONG TERM GOALS: Target date: 09/13/22   Pt will be Ind in a final HEP to maintain achieved LOF Baseline: initiated Goal status: INITIAL   2.  Pt will report a decrease in neck pain range to 0- 4/10 for improved function and QOL  Baseline: 2-10/10 Goal status: INITIAL   3.  Pt's neck AROM will incrrease by 10d for each motion for improved function with work and driving Baseline: see flow sheets Goal status: INITIAL   4.  Pt's FOTO score will improved to the predicted value of 53% as indication of improved function  Baseline: 41% Goal status: INITIAL   PLAN:   PT FREQUENCY: 2x/week   PT DURATION: 6 weeks   PLANNED INTERVENTIONS: Therapeutic exercises, Therapeutic activity, Patient/Family education, Self Care, Dry Needling, Electrical stimulation, Spinal mobilization, Cryotherapy, Moist heat, Taping, Traction, Ultrasound, Ionotophoresis '4mg'$ /ml Dexamethasone, Manual therapy, and Re-evaluation   PLAN FOR NEXT SESSION: recheck objective measure;  TPDN SCM/ sub occipitals; assess response to HEP; progress therex as indicated; use of modalities, manual therapy; and TPDN as indicated. Trial of cervical traction.   Carmine Carrozza MS, PT 08/16/22 6:18 PM

## 2022-08-16 ENCOUNTER — Ambulatory Visit: Payer: 59

## 2022-08-16 DIAGNOSIS — R252 Cramp and spasm: Secondary | ICD-10-CM

## 2022-08-16 DIAGNOSIS — M6281 Muscle weakness (generalized): Secondary | ICD-10-CM

## 2022-08-16 DIAGNOSIS — M542 Cervicalgia: Secondary | ICD-10-CM

## 2022-08-20 ENCOUNTER — Ambulatory Visit: Payer: 59 | Admitting: Physical Therapy

## 2022-08-20 ENCOUNTER — Encounter: Payer: Self-pay | Admitting: Physical Therapy

## 2022-08-20 DIAGNOSIS — M542 Cervicalgia: Secondary | ICD-10-CM

## 2022-08-20 DIAGNOSIS — R252 Cramp and spasm: Secondary | ICD-10-CM

## 2022-08-20 DIAGNOSIS — M6281 Muscle weakness (generalized): Secondary | ICD-10-CM

## 2022-08-20 NOTE — Therapy (Signed)
OUTPATIENT PHYSICAL THERAPY TREATMENT NOTE   Patient Name: Brett Clark MRN: [id number] DOB:06-06-1956, 67 y.o., male Today's Date: 08/20/2022  PCP: Maury Dus, MD   REFERRING PROVIDER: Kary Kos, MD END OF SESSION:   PT End of Session - 08/20/22 0719     Visit Number 6    Number of Visits 13    Date for PT Re-Evaluation 09/13/22    Authorization Type [employer] OTHER    PT Start Time [phone]    PT Stop Time 0800    PT Time Calculation (min) 42 min               Past Medical History:  Diagnosis Date   Abnormal abdominal CT scan    a. Abnl lesion on kidney/liver noted on CT @ OSH [facility]) - no further details available.   Atypical mole 09/06/2003   Upper Lesion (moderate) (widershave)   Atypical mole 09/06/2003   Middle Back Lesion (moderate to severe) (excision)   Atypical mole 11/08/2003   Mid Low Back (moderate to severe) (excision)   Atypical mole 11/08/2003   Right Lower Shoulder (moderate)   Atypical mole 11/08/2003   Mid Upper Back (moderate)   Atypical mole 11/08/2003   Left Mid Back (moderate)   Atypical mole 11/08/2003   Mid Abdomen (moderate) (widershave)   Atypical mole 08/02/2004   Mid Lower Back (moderate)   Atypical mole 08/02/2004   Right Shoulder Sup (moderate) (widershave)   Atypical mole 08/02/2004   Right Shoulder Inf (moderate)   Atypical mole 08/02/2004   Right Scapula (moderate)   Atypical mole 06/22/2007   Left Shoulder (moderate to severe) Mindi Slicker)   Atypical mole 01/22/2005   Mid Back (slight to moderate) Mindi Slicker)   Atypical mole 09/28/2013   Right Lower Back (moderate to severe)   Atypical mole 06/04/2021   mod-severe-mid back (WS)   CAD (coronary artery disease)    a. 06/2014 NSTEMI/PCI: LM nl, LAD 80 (2.5x18 Xience DES), LCX nl, RCA nl, EF 40-45%.   Dyslipidemia    Headache    migraine and tension   Ischemic cardiomyopathy    a. 06/2014 EF 40-45% by LV gram;  b. 06/2014 Echo: EF 50-55%, mild LVH,  distal anterior, apical, and inferoapical wall motion abnormalities, DD, mild MR.   Myocardial infarction ([place]) 06/2014   Nodular basal cell carcinoma (BCC) 10/01/2017   Right Outer Forehead (excision)   Right renal mass    SCCA (squamous cell carcinoma) of skin 02/15/2020   Right Forearm-Posterior (well diff) [place] CURET CAUTERY 5FU   Tobacco abuse    Past Surgical History:  Procedure Laterality Date   BACK SURGERY  years ago   neck   CARDIAC CATHETERIZATION  06/23/2014   DES to mid LAD, single v disease   hydrocele surgery  15 years ago   LEFT HEART CATHETERIZATION WITH CORONARY ANGIOGRAM N/A 06/23/2014   Procedure: LEFT HEART CATHETERIZATION WITH CORONARY ANGIOGRAM;  Surgeon: Peter M Martinique, MD;  Location: [facility] CATH LAB;  Service: Cardiovascular;  Laterality: N/A;   ROBOTIC ASSITED PARTIAL NEPHRECTOMY Right 01/18/2015   Procedure: ROBOTIC ASSITED PARTIAL NEPHRECTOMY, INTRAOPERATIVE ULTRASOUND, AND INDOCYANINE GREEN DYE;  Surgeon: Alexis Frock, MD;  Location: [facility] ORS;  Service: Urology;  Laterality: Right;   Patient Active Problem List   Diagnosis Date Noted   Nodular basal cell carcinoma 09/07/2018   Multiple atypical skin moles 09/07/2018   Impingement syndrome of right shoulder 03/31/2018   CKD (chronic kidney disease), stage III ([place]) 11/24/2017  Hypertension 11/18/2017   Renal cell carcinoma ([place]) 09/19/2015   CAD (coronary artery disease)    Hyperlipidemia    Ischemic cardiomyopathy    Tobacco abuse    CAD S/P PCI mLAD 2.5 x 18 mm Xience [place] DES 06/24/2014    Class: Status post   Non-STEMI (non-ST elevated myocardial infarction) ([place]) 06/22/2014   Abnormal abdominal CT scan 06/22/2014   Epigastric abdominal tenderness 06/22/2014    REFERRING DIAG: M54.12 (ICD-10-CM) - Radiculopathy, cervical, C6   THERAPY DIAG:  Cervicalgia  Cramp and spasm  Muscle weakness (generalized)  Rationale for Evaluation and Treatment rehabilitation  PERTINENT HISTORY:  C3-4 fusion  2009; CAD; ischemic cardiomyopathy  PRECAUTIONS: None  SUBJECTIVE:                                                                                                                                                                                      SUBJECTIVE STATEMENT: Pt reports he thinks the pain is a little better and the rom is better turning to the right. He had to do some hard work under a car which caused just as much pain as usual.   PAIN:  Are you having pain? Yes: NPRS scale: 5/10 Pain location: base of skull and upper neck  Pain description: ache, sharp, constant, wrose at end of day, and head feels heavy Aggravating factors: End of day, looking up and turning head Relieving factors: Ibuprofen, hot shower On eval: pain range: 2-10/10. Ave pain 6/10   OBJECTIVE: (objective measures completed at initial evaluation unless otherwise dated)   DIAGNOSTIC FINDINGS:  MRI cervical 08/01/18     IMPRESSION: 1. Slight progression of disc bulges and small central disc protrusions at C4-5 and C5-6 with slight increased indentation upon the thecal sac and cervical spinal cord at both levels but without myelopathy. 2. Stable myelopathy at C3-4 with no residual neural impingement.   Pt reports a MRI by Dr. Saintclair Halsted approx 3 months ago-unavailable in Epic   PATIENT SURVEYS:  FOTO: Perceived function   41%, predicted   53%    COGNITION: Overall cognitive status: Within functional limits for tasks assessed   SENSATION: WFL   POSTURE: rounded shoulders, decreased lumbar lordosis, and decreased thoracic kyphosis   PALPATION: TTP of the suboccipital muscles bilat R>L   CERVICAL ROM:    Active ROM A/PROM (deg) eval AROM after TPDN 08/09/22 AROM after [facility] 08/14/22 08/20/22 Start of session  Flexion 21 upper neck pulling 20    Extension 11 upper neck pressure pain 20    Right lateral flexion 10 R post/lat neck pain 18    Left lateral flexion 5 R post/lat nack pain 10  Right  rotation 10 post/lat neck pain 20 30 40  Left rotation 20 blat upper shoulder pain 40 40 50   (Blank rows = not tested)   UPPER EXTREMITY ROM: Grossly WNLs and eqaul Active ROM Right eval Left eval  Shoulder flexion      Shoulder extension      Shoulder abduction      Shoulder adduction      Shoulder extension      Shoulder internal rotation      Shoulder external rotation      Elbow flexion      Elbow extension      Wrist flexion      Wrist extension      Wrist ulnar deviation      Wrist radial deviation      Wrist pronation      Wrist supination       (Blank rows = not tested)   UPPER EXTREMITY MMT: Myotome screen is negative. 5/5 strength and equal MMT Right eval Left eval  Shoulder flexion      Shoulder extension      Shoulder abduction      Shoulder adduction      Shoulder extension      Shoulder internal rotation      Shoulder external rotation      Middle trapezius      Lower trapezius      Elbow flexion      Elbow extension      Wrist flexion      Wrist extension      Wrist ulnar deviation      Wrist radial deviation      Wrist pronation      Wrist supination      Grip strength       (Blank rows = not tested)   CERVICAL SPECIAL TESTS:  Neck flexor muscle endurance test: Positive 3", Spurling's test: Negative, and Distraction test: Negative   FUNCTIONAL TESTS:  NT   TODAY'S TREATMENT:  [facility] Adult PT Treatment:                                                DATE: 08/20/22 Therapeutic Exercise: Supine with head on ball- cervical rotation AROM, nods, chin tuck with cervical rotations Supine blue band horizontal abduction Supine star pattern with blue Seated SCM stretch bilat - Seated levator stretch Standing green band rows  Manual Therapy: STW to cercical paraspinals , sub occipitals, SCM Contract relax cervical rotation , right     [facility] Adult PT Treatment:                                                DATE: 08/16/22 Therapeutic  Exercise: seated chin tuck x5 3" Seated upper trap stretch x2 15' each Seated chin tuck c side bending x2 15" each Seated chin tuck c rotation x2 15" each Shoulder hor abd star pattern 2x5 patterns GTB Shoulder ER  2x10 GTB Manual Therapy: STM to the suboccipitals, cervical paraspinals, and upper traps bilat Suboccipital release UPA C2-C5 Contact/relax cervical side bending ROM Skilled palpation to identify TrPs and taut muscle bands  Trigger Point Dry Needling Treatment: Pre-treatment instruction: Patient instructed on dry needling rationale, procedures,  and possible side effects including pain during treatment (achy,cramping feeling), bruising, drop of blood, lightheadedness, nausea, sweating. Patient Consent Given: Yes Education handout provided: Previously provided Muscles treated: upper traps bliat  Needle size and number: .30x68m x 2;  .30x343mx 2 Electrical stimulation performed: No Parameters: N/A Treatment response/outcome: Muscle twitch and increased muscle lengthening Post-treatment instructions: Patient instructed to expect possible mild to moderate muscle soreness later today and/or tomorrow. Patient instructed in methods to reduce muscle soreness and to continue prescribed HEP. If patient was dry needled over the lung field, patient was instructed on signs and symptoms of pneumothorax and, however unlikely, to see immediate medical attention should they occur. Patient was also educated on signs and symptoms of infection and to seek medical attention should they occur. Patient verbalized understanding of these instructions and education.   [facility]                                                [facility]: 08[facility] Therapeutic Exercise: Supine chin tuck x5 3" Supine lift offs x5 5" Seated upper trap stretch x2 15' each Seated chin tuck c side bending x2 15" each Seated chin tuck c rotation x2 15" each Shoulder hor abd star pattern x2 5 GTB Manual Therapy: STM  to the suboccipitals, cervical paraspinals, and upper traps bilat UPA C2-C5 Contact/relax cervical side bending Skilled palpation to identify TrPs and taut muscle bands Trigger Point Dry Needling Treatment: Pre-treatment instruction: Patient instructed on dry needling rationale, procedures, and possible side effects including pain during treatment (achy,cramping feeling), bruising, drop of blood, lightheadedness, nausea, sweating. Patient Consent Given: Yes Education handout provided: Previously provided Muscles treated: upper traps bliat  Needle size and number: .30x5018m 1 Electrical stimulation performed: No Parameters: N/A Treatment response/outcome: Muscle twitch and increased muscle lengthening Post-treatment instructions: Patient instructed to expect possible mild to moderate muscle soreness later today and/or tomorrow. Patient instructed in methods to reduce muscle soreness and to continue prescribed HEP. If patient was dry needled over the lung field, patient was instructed on signs and symptoms of pneumothorax and, however unlikely, to see immediate medical attention should they occur. Patient was also educated on signs and symptoms of infection and to seek medical attention should they occur. Patient verbalized understanding of these instructions and education.    [facility]                                                [facility]: 08[facility]22 Therapeutic Exercise: SCM stretch 10 sec x 3 each way Levator stretches 10 sec a 3 each way Chin tucks 5 sec x 10 Manual Therapy: STW following TPDN to cervical paraspinals.sub occipitals  Modalities: HMP x10 minutes   Trigger Point Dry-Needling  Treatment instructions: Expect mild to moderate muscle soreness. S/S of pneumothorax if dry needled over a lung field, and to seek immediate medical attention should they occur. Patient verbalized understanding of these instructions and education.  Patient Consent Given: Yes Education  handout provided: Yes Muscles treated: right SCM, cervical multifidi C1- C2 bilat; sub occipitals bilat Electrical stimulation performed: No Parameters: N/A Treatment response/outcome: twitch response     PATIENT EDUCATION:  Education details: Eval findings, POC, HEP, self  care  Person educated: Patient Education method: Explanation, Demonstration, Tactile cues, Verbal cues, and Handouts Education comprehension: verbalized understanding, returned demonstration, verbal cues required, and tactile cues required   HOME EXERCISE PROGRAM: Access Code: [email or web address] URL: [email or web address] Date: 07/24/2022 Prepared by: Gar Ponto   Exercises - Supine Cervical Retraction with Towel  - 2 x daily - 7 x weekly - 1 sets - 5 reps - 5 hold - Supine Deep Neck Flexor Training - Repetitions  - 1 x daily - 7 x weekly - 1 sets - 5 reps - 10 hold - Seated Passive Cervical Retraction  - 6 x daily - 7 x weekly - 1 sets - 3-5 reps - 5 hold - Seated Upper Trapezius Stretch  - 6 x daily - 7 x weekly - 1 sets - 3-5 reps - 15 hold - Standing Cervical Rotation AROM with Overpressure  - 6 x daily - 7 x weekly - 1 sets - 3-5 reps - 15 hold Added 08/07/22 - Gentle Levator Scapulae Stretch  - 1 x daily - 7 x weekly - 1 sets - 3-5 reps - 15 hold Added 08/09/22 - Sternocleidomastoid Stretch  - 1 x daily - 7 x weekly - 1 sets - 3 reps - 15-20 hold    ASSESSMENT:   CLINICAL IMPRESSION: PT was completed for manual therapy as noted above. His AROM continues to improve. His pain is slightly improved overall. He reports that TPDN was very painful but helpful. Continued with manual and ROM focus. Added standing scapular bands today. He tolerated session well.    OBJECTIVE IMPAIRMENTS: decreased activity tolerance, decreased ROM, decreased strength, increased fascial restrictions, increased muscle spasms, postural dysfunction, and pain.    ACTIVITY LIMITATIONS: reach over head and looking up   PARTICIPATION  LIMITATIONS: driving and occupation   PERSONAL FACTORS: Past/current experiences and Time since onset of injury/illness/exacerbation are also affecting patient's functional outcome.    REHAB POTENTIAL: Good   CLINICAL DECISION MAKING: Evolving/moderate complexity   EVALUATION COMPLEXITY: Moderate     GOALS:   SHORT TERM GOALS: Target date: 08/15/22   Pt will be Ind in an initial HEP Baseline: Initiated Goal status: 08/16/22 MET   2.  Pt will voice understanding of measures to assist in pain reduction  Baseline: initiated Goal status: 08/16/22 MET     LONG TERM GOALS: Target date: 09/13/22   Pt will be Ind in a final HEP to maintain achieved LOF Baseline: initiated Goal status: INITIAL   2.  Pt will report a decrease in neck pain range to 0- 4/10 for improved function and QOL  Baseline: 2-10/10 Goal status: INITIAL   3.  Pt's neck AROM will incrrease by 10d for each motion for improved function with work and driving Baseline: see flow sheets Goal status: INITIAL   4.  Pt's FOTO score will improved to the predicted value of 53% as indication of improved function  Baseline: 41% Goal status: INITIAL   PLAN:   PT FREQUENCY: 2x/week   PT DURATION: 6 weeks   PLANNED INTERVENTIONS: Therapeutic exercises, Therapeutic activity, Patient/Family education, Self Care, Dry Needling, Electrical stimulation, Spinal mobilization, Cryotherapy, Moist heat, Taping, Traction, Ultrasound, Ionotophoresis '4mg'$ /ml Dexamethasone, Manual therapy, and Re-evaluation   PLAN FOR NEXT SESSION: recheck objective measure;  TPDN SCM/ sub occipitals; assess response to HEP; progress therex as indicated; use of modalities, manual therapy; and TPDN as indicated. Trial of cervical traction (pt has cervical fusion) , give standing bands for HEP, recheck FOTO  Hessie Diener, PTA 08/20/22 8:12 AM Phone: [phone] Fax: [phone]

## 2022-08-21 NOTE — Therapy (Signed)
OUTPATIENT PHYSICAL THERAPY TREATMENT NOTE   Patient Name: Brett Clark MRN: [id number] DOB:1955-12-23, 67 y.o., male Today's Date: 08/22/2022  PCP: Maury Dus, MD   REFERRING PROVIDER: Kary Kos, MD END OF SESSION:   PT End of Session - 08/22/22 0747     Visit Number 7    Number of Visits 13    Date for PT Re-Evaluation 09/13/22    Authorization Type [employer] OTHER    PT Start Time [phone]    PT Stop Time 0758    PT Time Calculation (min) 40 min    Activity Tolerance Patient tolerated treatment well    Behavior During Therapy [facility] for tasks assessed/performed               Past Medical History:  Diagnosis Date   Abnormal abdominal CT scan    a. Abnl lesion on kidney/liver noted on CT @ OSH [facility]) - no further details available.   Atypical mole 09/06/2003   Upper Lesion (moderate) (widershave)   Atypical mole 09/06/2003   Middle Back Lesion (moderate to severe) (excision)   Atypical mole 11/08/2003   Mid Low Back (moderate to severe) (excision)   Atypical mole 11/08/2003   Right Lower Shoulder (moderate)   Atypical mole 11/08/2003   Mid Upper Back (moderate)   Atypical mole 11/08/2003   Left Mid Back (moderate)   Atypical mole 11/08/2003   Mid Abdomen (moderate) (widershave)   Atypical mole 08/02/2004   Mid Lower Back (moderate)   Atypical mole 08/02/2004   Right Shoulder Sup (moderate) (widershave)   Atypical mole 08/02/2004   Right Shoulder Inf (moderate)   Atypical mole 08/02/2004   Right Scapula (moderate)   Atypical mole 06/22/2007   Left Shoulder (moderate to severe) Mindi Slicker)   Atypical mole 01/22/2005   Mid Back (slight to moderate) Mindi Slicker)   Atypical mole 09/28/2013   Right Lower Back (moderate to severe)   Atypical mole 06/04/2021   mod-severe-mid back (WS)   CAD (coronary artery disease)    a. 06/2014 NSTEMI/PCI: LM nl, LAD 80 (2.5x18 Xience DES), LCX nl, RCA nl, EF 40-45%.   Dyslipidemia    Headache    migraine  and tension   Ischemic cardiomyopathy    a. 06/2014 EF 40-45% by LV gram;  b. 06/2014 Echo: EF 50-55%, mild LVH, distal anterior, apical, and inferoapical wall motion abnormalities, DD, mild MR.   Myocardial infarction ([place]) 06/2014   Nodular basal cell carcinoma (BCC) 10/01/2017   Right Outer Forehead (excision)   Right renal mass    SCCA (squamous cell carcinoma) of skin 02/15/2020   Right Forearm-Posterior (well diff) [place] CURET CAUTERY 5FU   Tobacco abuse    Past Surgical History:  Procedure Laterality Date   BACK SURGERY  years ago   neck   CARDIAC CATHETERIZATION  06/23/2014   DES to mid LAD, single v disease   hydrocele surgery  15 years ago   LEFT HEART CATHETERIZATION WITH CORONARY ANGIOGRAM N/A 06/23/2014   Procedure: LEFT HEART CATHETERIZATION WITH CORONARY ANGIOGRAM;  Surgeon: Peter M Martinique, MD;  Location: [facility] CATH LAB;  Service: Cardiovascular;  Laterality: N/A;   ROBOTIC ASSITED PARTIAL NEPHRECTOMY Right 01/18/2015   Procedure: ROBOTIC ASSITED PARTIAL NEPHRECTOMY, INTRAOPERATIVE ULTRASOUND, AND INDOCYANINE GREEN DYE;  Surgeon: Alexis Frock, MD;  Location: [facility] ORS;  Service: Urology;  Laterality: Right;   Patient Active Problem List   Diagnosis Date Noted   Nodular basal cell carcinoma 09/07/2018   Multiple atypical skin moles 09/07/2018  Impingement syndrome of right shoulder 03/31/2018   CKD (chronic kidney disease), stage III ([place]) 11/24/2017   Hypertension 11/18/2017   Renal cell carcinoma ([place]) 09/19/2015   CAD (coronary artery disease)    Hyperlipidemia    Ischemic cardiomyopathy    Tobacco abuse    CAD S/P PCI mLAD 2.5 x 18 mm Xience [place] DES 06/24/2014    Class: Status post   Non-STEMI (non-ST elevated myocardial infarction) ([place]) 06/22/2014   Abnormal abdominal CT scan 06/22/2014   Epigastric abdominal tenderness 06/22/2014    REFERRING DIAG: M54.12 (ICD-10-CM) - Radiculopathy, cervical, C6   THERAPY DIAG:  Cervicalgia  Cramp and spasm  Muscle  weakness (generalized)  Rationale for Evaluation and Treatment rehabilitation  PERTINENT HISTORY:  C3-4 fusion 2009; CAD; ischemic cardiomyopathy  PRECAUTIONS: None  SUBJECTIVE:                                                                                                                                                                                      SUBJECTIVE STATEMENT: Pt est mobility is approx. 25% better (with driving), while pain is improved but not to that degree. Pt notes he has had a physically demanding week at work, and his pain level usually with this type of week is more painful.  PAIN:  Are you having pain? Yes: NPRS scale: 5/10 Pain location: base of skull and upper neck  Pain description: ache, sharp, constant, wrose at end of day, and head feels heavy Aggravating factors: End of day, looking up and turning head Relieving factors: Ibuprofen, hot shower On eval: pain range: 2-10/10. Ave pain 6/10   OBJECTIVE: (objective measures completed at initial evaluation unless otherwise dated)   DIAGNOSTIC FINDINGS:  MRI cervical 08/01/18     IMPRESSION: 1. Slight progression of disc bulges and small central disc protrusions at C4-5 and C5-6 with slight increased indentation upon the thecal sac and cervical spinal cord at both levels but without myelopathy. 2. Stable myelopathy at C3-4 with no residual neural impingement.   Pt reports a MRI by Dr. Saintclair Halsted approx 3 months ago-unavailable in Epic   PATIENT SURVEYS:  FOTO: Perceived function   41%, predicted   53%    COGNITION: Overall cognitive status: Within functional limits for tasks assessed   SENSATION: WFL   POSTURE: rounded shoulders, decreased lumbar lordosis, and decreased thoracic kyphosis   PALPATION: TTP of the suboccipital muscles bilat R>L   CERVICAL ROM:    Active ROM A/PROM (deg) eval AROM after TPDN 08/09/22 AROM after [facility] 08/14/22 08/20/22 Start of session  Flexion 21 upper neck pulling 20     Extension 11 upper neck pressure pain 20  Right lateral flexion 10 R post/lat neck pain 18    Left lateral flexion 5 R post/lat nack pain 10    Right rotation 10 post/lat neck pain 20 30 40  Left rotation 20 blat upper shoulder pain 40 40 50   (Blank rows = not tested)   UPPER EXTREMITY ROM: Grossly WNLs and eqaul Active ROM Right eval Left eval  Shoulder flexion      Shoulder extension      Shoulder abduction      Shoulder adduction      Shoulder extension      Shoulder internal rotation      Shoulder external rotation      Elbow flexion      Elbow extension      Wrist flexion      Wrist extension      Wrist ulnar deviation      Wrist radial deviation      Wrist pronation      Wrist supination       (Blank rows = not tested)   UPPER EXTREMITY MMT: Myotome screen is negative. 5/5 strength and equal MMT Right eval Left eval  Shoulder flexion      Shoulder extension      Shoulder abduction      Shoulder adduction      Shoulder extension      Shoulder internal rotation      Shoulder external rotation      Middle trapezius      Lower trapezius      Elbow flexion      Elbow extension      Wrist flexion      Wrist extension      Wrist ulnar deviation      Wrist radial deviation      Wrist pronation      Wrist supination      Grip strength       (Blank rows = not tested)   CERVICAL SPECIAL TESTS:  Neck flexor muscle endurance test: Positive 3", Spurling's test: Negative, and Distraction test: Negative   FUNCTIONAL TESTS:  NT   TODAY'S TREATMENT:  [facility] Adult PT Treatment:                                                DATE: 08/22/22 Therapeutic Exercise: Cervical retraction x5 3" Cervical rotation c pt over pressure x3 15" Cervical side bending c pt over pressure x3 15" Manual Therapy: STM to the suboccipitals, cervical paraspinals, and upper traps bilat Suboccipital release UPA C2-C6 Modalities: Pneumatic cervical traction x10 mins at 20# Self  Care: Use of home traction  [facility] Adult PT Treatment:                                                DATE: 08/20/22 Therapeutic Exercise: Supine with head on ball- cervical rotation AROM, nods, chin tuck with cervical rotations Supine blue band horizontal abduction Supine star pattern with blue Seated SCM stretch bilat - Seated levator stretch Standing green band rows  Manual Therapy: STW to cercical paraspinals , sub occipitals, SCM Contract relax cervical rotation , right     [facility] Adult PT Treatment:  DATE: 08/16/22 Therapeutic Exercise: seated chin tuck x5 3" Seated upper trap stretch x2 15' each Seated chin tuck c side bending x2 15" each Seated chin tuck c rotation x2 15" each Shoulder hor abd star pattern 2x5 patterns GTB Shoulder ER  2x10 GTB Manual Therapy: STM to the suboccipitals, cervical paraspinals, and upper traps bilat Suboccipital release UPA C2-C5 Contact/relax cervical side bending ROM Skilled palpation to identify TrPs and taut muscle bands  Trigger Point Dry Needling Treatment: Pre-treatment instruction: Patient instructed on dry needling rationale, procedures, and possible side effects including pain during treatment (achy,cramping feeling), bruising, drop of blood, lightheadedness, nausea, sweating. Patient Consent Given: Yes Education handout provided: Previously provided Muscles treated: upper traps bliat  Needle size and number: .30x30m x 2;  .30x356mx 2 Electrical stimulation performed: No Parameters: N/A Treatment response/outcome: Muscle twitch and increased muscle lengthening Post-treatment instructions: Patient instructed to expect possible mild to moderate muscle soreness later today and/or tomorrow. Patient instructed in methods to reduce muscle soreness and to continue prescribed HEP. If patient was dry needled over the lung field, patient was instructed on signs and symptoms of pneumothorax and,  however unlikely, to see immediate medical attention should they occur. Patient was also educated on signs and symptoms of infection and to seek medical attention should they occur. Patient verbalized understanding of these instructions and education.    PATIENT EDUCATION:  Education details: Eval findings, POC, HEP, self care  Person educated: Patient Education method: Explanation, Demonstration, Tactile cues, Verbal cues, and Handouts Education comprehension: verbalized understanding, returned demonstration, verbal cues required, and tactile cues required   HOME EXERCISE PROGRAM: Access Code: [email or web address] URL: [email or web address] Date: 07/24/2022 Prepared by: AlGar Ponto Exercises - Supine Cervical Retraction with Towel  - 2 x daily - 7 x weekly - 1 sets - 5 reps - 5 hold - Supine Deep Neck Flexor Training - Repetitions  - 1 x daily - 7 x weekly - 1 sets - 5 reps - 10 hold - Seated Passive Cervical Retraction  - 6 x daily - 7 x weekly - 1 sets - 3-5 reps - 5 hold - Seated Upper Trapezius Stretch  - 6 x daily - 7 x weekly - 1 sets - 3-5 reps - 15 hold - Standing Cervical Rotation AROM with Overpressure  - 6 x daily - 7 x weekly - 1 sets - 3-5 reps - 15 hold Added 08/07/22 - Gentle Levator Scapulae Stretch  - 1 x daily - 7 x weekly - 1 sets - 3-5 reps - 15 hold Added 08/09/22 - Sternocleidomastoid Stretch  - 1 x daily - 7 x weekly - 1 sets - 3 reps - 15-20 hold    ASSESSMENT:   CLINICAL IMPRESSION: PT was completed for manual therapy as noted above. Cervical traction was then completed using the pneumatic device f/b cervical ROM/flexibility therex. Discussed with pt the use of home cervical traction unit to help manage pain and mobility. Pt voiced understanding of the possible benefits. Pt tolerated PT today without adverse effects. Pt will continue to benefit from skilled PT to address impairments for improved neck function with less pain.   OBJECTIVE IMPAIRMENTS: decreased  activity tolerance, decreased ROM, decreased strength, increased fascial restrictions, increased muscle spasms, postural dysfunction, and pain.    ACTIVITY LIMITATIONS: reach over head and looking up   PARTICIPATION LIMITATIONS: driving and occupation   PERSONAL FACTORS: Past/current experiences and Time since onset of injury/illness/exacerbation are also affecting patient's functional outcome.  REHAB POTENTIAL: Good   CLINICAL DECISION MAKING: Evolving/moderate complexity   EVALUATION COMPLEXITY: Moderate     GOALS:   SHORT TERM GOALS: Target date: 08/15/22   Pt will be Ind in an initial HEP Baseline: Initiated Goal status: 08/16/22 MET   2.  Pt will voice understanding of measures to assist in pain reduction  Baseline: initiated Goal status: 08/16/22 MET     LONG TERM GOALS: Target date: 09/13/22   Pt will be Ind in a final HEP to maintain achieved LOF Baseline: initiated Goal status: INITIAL   2.  Pt will report a decrease in neck pain range to 0- 4/10 for improved function and QOL  Baseline: 2-10/10 Goal status: INITIAL   3.  Pt's neck AROM will incrrease by 10d for each motion for improved function with work and driving Baseline: see flow sheets Goal status: INITIAL   4.  Pt's FOTO score will improved to the predicted value of 53% as indication of improved function  Baseline: 41% Goal status: INITIAL   PLAN:   PT FREQUENCY: 2x/week   PT DURATION: 6 weeks   PLANNED INTERVENTIONS: Therapeutic exercises, Therapeutic activity, Patient/Family education, Self Care, Dry Needling, Electrical stimulation, Spinal mobilization, Cryotherapy, Moist heat, Taping, Traction, Ultrasound, Ionotophoresis '4mg'$ /ml Dexamethasone, Manual therapy, and Re-evaluation   PLAN FOR NEXT SESSION: recheck objective measure;  TPDN SCM/ sub occipitals; assess response to HEP; progress therex as indicated; use of modalities, manual therapy; and TPDN as indicated. Trial of cervical traction (pt  has cervical fusion) , give standing bands for HEP, recheck [employer] MS, PT 08/22/22 1:21 PM

## 2022-08-22 ENCOUNTER — Ambulatory Visit: Payer: 59

## 2022-08-22 DIAGNOSIS — M542 Cervicalgia: Secondary | ICD-10-CM | POA: Diagnosis not present

## 2022-08-22 DIAGNOSIS — M6281 Muscle weakness (generalized): Secondary | ICD-10-CM

## 2022-08-22 DIAGNOSIS — R252 Cramp and spasm: Secondary | ICD-10-CM

## 2022-08-26 NOTE — Therapy (Signed)
OUTPATIENT PHYSICAL THERAPY TREATMENT NOTE   Patient Name: Brett Clark MRN: [id number] DOB:[birth year], 67 y.o., male Today's Date: 08/27/2022  PCP: Maury Dus, MD   REFERRING PROVIDER: Kary Kos, MD END OF SESSION:   PT End of Session - 08/27/22 0753     Visit Number 8    Number of Visits 13    Date for PT Re-Evaluation 09/13/22    Authorization Type [employer] OTHER    PT Start Time [phone]    PT Stop Time 0801    PT Time Calculation (min) 43 min    Activity Tolerance Patient tolerated treatment well    Behavior During Therapy [facility] for tasks assessed/performed                Past Medical History:  Diagnosis Date   Abnormal abdominal CT scan    a. Abnl lesion on kidney/liver noted on CT @ OSH [facility]) - no further details available.   Atypical mole 09/06/2003   Upper Lesion (moderate) (widershave)   Atypical mole 09/06/2003   Middle Back Lesion (moderate to severe) (excision)   Atypical mole 11/08/2003   Mid Low Back (moderate to severe) (excision)   Atypical mole 11/08/2003   Right Lower Shoulder (moderate)   Atypical mole 11/08/2003   Mid Upper Back (moderate)   Atypical mole 11/08/2003   Left Mid Back (moderate)   Atypical mole 11/08/2003   Mid Abdomen (moderate) (widershave)   Atypical mole 08/02/2004   Mid Lower Back (moderate)   Atypical mole 08/02/2004   Right Shoulder Sup (moderate) (widershave)   Atypical mole 08/02/2004   Right Shoulder Inf (moderate)   Atypical mole 08/02/2004   Right Scapula (moderate)   Atypical mole 06/22/2007   Left Shoulder (moderate to severe) Mindi Slicker)   Atypical mole 01/22/2005   Mid Back (slight to moderate) Mindi Slicker)   Atypical mole 09/28/2013   Right Lower Back (moderate to severe)   Atypical mole 06/04/2021   mod-severe-mid back (WS)   CAD (coronary artery disease)    a. 06/2014 NSTEMI/PCI: LM nl, LAD 80 (2.5x18 Xience DES), LCX nl, RCA nl, EF 40-45%.   Dyslipidemia    Headache     migraine and tension   Ischemic cardiomyopathy    a. 06/2014 EF 40-45% by LV gram;  b. 06/2014 Echo: EF 50-55%, mild LVH, distal anterior, apical, and inferoapical wall motion abnormalities, DD, mild MR.   Myocardial infarction ([place]) 06/2014   Nodular basal cell carcinoma (BCC) 10/01/2017   Right Outer Forehead (excision)   Right renal mass    SCCA (squamous cell carcinoma) of skin 02/15/2020   Right Forearm-Posterior (well diff) [place] CURET CAUTERY 5FU   Tobacco abuse    Past Surgical History:  Procedure Laterality Date   BACK SURGERY  years ago   neck   CARDIAC CATHETERIZATION  06/23/2014   DES to mid LAD, single v disease   hydrocele surgery  15 years ago   LEFT HEART CATHETERIZATION WITH CORONARY ANGIOGRAM N/A 06/23/2014   Procedure: LEFT HEART CATHETERIZATION WITH CORONARY ANGIOGRAM;  Surgeon: Peter M Martinique, MD;  Location: [facility] CATH LAB;  Service: Cardiovascular;  Laterality: N/A;   ROBOTIC ASSITED PARTIAL NEPHRECTOMY Right 01/18/2015   Procedure: ROBOTIC ASSITED PARTIAL NEPHRECTOMY, INTRAOPERATIVE ULTRASOUND, AND INDOCYANINE GREEN DYE;  Surgeon: Alexis Frock, MD;  Location: [facility] ORS;  Service: Urology;  Laterality: Right;   Patient Active Problem List   Diagnosis Date Noted   Nodular basal cell carcinoma 09/07/2018   Multiple atypical skin moles  09/07/2018   Impingement syndrome of right shoulder 03/31/2018   CKD (chronic kidney disease), stage III ([place]) 11/24/2017   Hypertension 11/18/2017   Renal cell carcinoma ([place]) 09/19/2015   CAD (coronary artery disease)    Hyperlipidemia    Ischemic cardiomyopathy    Tobacco abuse    CAD S/P PCI mLAD 2.5 x 18 mm Xience [place] DES 06/24/2014    Class: Status post   Non-STEMI (non-ST elevated myocardial infarction) ([place]) 06/22/2014   Abnormal abdominal CT scan 06/22/2014   Epigastric abdominal tenderness 06/22/2014    REFERRING DIAG: M54.12 (ICD-10-CM) - Radiculopathy, cervical, C6   THERAPY DIAG:  Cervicalgia  Cramp and  spasm  Muscle weakness (generalized)  Rationale for Evaluation and Treatment rehabilitation  PERTINENT HISTORY:  C3-4 fusion 2009; CAD; ischemic cardiomyopathy  PRECAUTIONS: None  SUBJECTIVE:                                                                                                                                                                                      SUBJECTIVE STATEMENT: Pt reports working on the [employer] yesterday ar work and he has been experiencing significantly more pain.  PAIN:  Are you having pain? Yes: NPRS scale: 8-9/10 Pain location: base of skull and upper neck  Pain description: ache, sharp, constant, wrose at end of day, and head feels heavy Aggravating factors: End of day, looking up and turning head Relieving factors: Ibuprofen, hot shower On eval: pain range: 2-10/10. Ave pain 6/10   OBJECTIVE: (objective measures completed at initial evaluation unless otherwise dated)   DIAGNOSTIC FINDINGS:  MRI cervical 08/01/18     IMPRESSION: 1. Slight progression of disc bulges and small central disc protrusions at C4-5 and C5-6 with slight increased indentation upon the thecal sac and cervical spinal cord at both levels but without myelopathy. 2. Stable myelopathy at C3-4 with no residual neural impingement.   Pt reports a MRI by Dr. Saintclair Halsted approx 3 months ago-unavailable in Epic   PATIENT SURVEYS:  FOTO: Perceived function   41%, predicted   53%    COGNITION: Overall cognitive status: Within functional limits for tasks assessed   SENSATION: WFL   POSTURE: rounded shoulders, decreased lumbar lordosis, and decreased thoracic kyphosis   PALPATION: TTP of the suboccipital muscles bilat R>L   CERVICAL ROM:    Active ROM A/PROM (deg) eval AROM after TPDN 08/09/22 AROM after [facility] 08/14/22 08/20/22 Start of session  Flexion 21 upper neck pulling 20    Extension 11 upper neck pressure pain 20    Right lateral flexion 10 R post/lat neck pain 18     Left lateral flexion 5 R post/lat nack pain  10    Right rotation 10 post/lat neck pain 20 30 40  Left rotation 20 blat upper shoulder pain 40 40 50   (Blank rows = not tested)   UPPER EXTREMITY ROM: Grossly WNLs and eqaul Active ROM Right eval Left eval  Shoulder flexion      Shoulder extension      Shoulder abduction      Shoulder adduction      Shoulder extension      Shoulder internal rotation      Shoulder external rotation      Elbow flexion      Elbow extension      Wrist flexion      Wrist extension      Wrist ulnar deviation      Wrist radial deviation      Wrist pronation      Wrist supination       (Blank rows = not tested)   UPPER EXTREMITY MMT: Myotome screen is negative. 5/5 strength and equal MMT Right eval Left eval  Shoulder flexion      Shoulder extension      Shoulder abduction      Shoulder adduction      Shoulder extension      Shoulder internal rotation      Shoulder external rotation      Middle trapezius      Lower trapezius      Elbow flexion      Elbow extension      Wrist flexion      Wrist extension      Wrist ulnar deviation      Wrist radial deviation      Wrist pronation      Wrist supination      Grip strength       (Blank rows = not tested)   CERVICAL SPECIAL TESTS:  Neck flexor muscle endurance test: Positive 3", Spurling's test: Negative, and Distraction test: Negative   FUNCTIONAL TESTS:  NT   TODAY'S TREATMENT:  [facility] Adult PT Treatment:                                                DATE: 08/27/22 Manual Therapy: STM to the suboccipitals, cervical paraspinals, and upper traps bilat Suboccipital release UPA C2-C6 Manual traction Modalities: Pneumatic cervical traction x10 mins at 22 psi  [facility] Adult PT Treatment:                                                DATE: 08/22/22 Therapeutic Exercise: Cervical retraction x5 3" Cervical rotation c pt over pressure x3 15" Cervical side bending c pt over pressure x3  15" Manual Therapy: STM to the suboccipitals, cervical paraspinals, and upper traps bilat Suboccipital release UPA C2-C6 Modalities: Pneumatic cervical traction x10 mins at 20# Self Care: Use of home traction  [facility] Adult PT Treatment:                                                DATE: 08/20/22 Therapeutic Exercise: Supine with head on ball- cervical rotation AROM,  nods, chin tuck with cervical rotations Supine blue band horizontal abduction Supine star pattern with blue Seated SCM stretch bilat - Seated levator stretch Standing green band rows  Manual Therapy: STW to cercical paraspinals , sub occipitals, SCM Contract relax cervical rotation , right   [facility] Adult PT Treatment:                                                DATE: 08/16/22 Therapeutic Exercise: seated chin tuck x5 3" Seated upper trap stretch x2 15' each Seated chin tuck c side bending x2 15" each Seated chin tuck c rotation x2 15" each Shoulder hor abd star pattern 2x5 patterns GTB Shoulder ER  2x10 GTB Manual Therapy: STM to the suboccipitals, cervical paraspinals, and upper traps bilat Suboccipital release UPA C2-C5 Contact/relax cervical side bending ROM Skilled palpation to identify TrPs and taut muscle bands  Trigger Point Dry Needling Treatment: Pre-treatment instruction: Patient instructed on dry needling rationale, procedures, and possible side effects including pain during treatment (achy,cramping feeling), bruising, drop of blood, lightheadedness, nausea, sweating. Patient Consent Given: Yes Education handout provided: Previously provided Muscles treated: upper traps bliat  Needle size and number: .30x37m x 2;  .30x376mx 2 Electrical stimulation performed: No Parameters: N/A Treatment response/outcome: Muscle twitch and increased muscle lengthening Post-treatment instructions: Patient instructed to expect possible mild to moderate muscle soreness later today and/or tomorrow. Patient instructed  in methods to reduce muscle soreness and to continue prescribed HEP. If patient was dry needled over the lung field, patient was instructed on signs and symptoms of pneumothorax and, however unlikely, to see immediate medical attention should they occur. Patient was also educated on signs and symptoms of infection and to seek medical attention should they occur. Patient verbalized understanding of these instructions and education.    PATIENT EDUCATION:  Education details: Eval findings, POC, HEP, self care  Person educated: Patient Education method: Explanation, Demonstration, Tactile cues, Verbal cues, and Handouts Education comprehension: verbalized understanding, returned demonstration, verbal cues required, and tactile cues required   HOME EXERCISE PROGRAM: Access Code: [email or web address] URL: [email or web address] Date: 07/24/2022 Prepared by: AlGar Ponto Exercises - Supine Cervical Retraction with Towel  - 2 x daily - 7 x weekly - 1 sets - 5 reps - 5 hold - Supine Deep Neck Flexor Training - Repetitions  - 1 x daily - 7 x weekly - 1 sets - 5 reps - 10 hold - Seated Passive Cervical Retraction  - 6 x daily - 7 x weekly - 1 sets - 3-5 reps - 5 hold - Seated Upper Trapezius Stretch  - 6 x daily - 7 x weekly - 1 sets - 3-5 reps - 15 hold - Standing Cervical Rotation AROM with Overpressure  - 6 x daily - 7 x weekly - 1 sets - 3-5 reps - 15 hold Added 08/07/22 - Gentle Levator Scapulae Stretch  - 1 x daily - 7 x weekly - 1 sets - 3-5 reps - 15 hold Added 08/09/22 - Sternocleidomastoid Stretch  - 1 x daily - 7 x weekly - 1 sets - 3 reps - 15-20 hold    ASSESSMENT:   CLINICAL IMPRESSION: PT focused on manual therapy and the use of pneumatic traction for mobility and and reduction of pain. Following the PT session, pt reported a decrease in neck pain to 4-5/10. Pt  respondd positively to today's PT intervention.  OBJECTIVE IMPAIRMENTS: decreased activity tolerance, decreased ROM,  decreased strength, increased fascial restrictions, increased muscle spasms, postural dysfunction, and pain.    ACTIVITY LIMITATIONS: reach over head and looking up   PARTICIPATION LIMITATIONS: driving and occupation   PERSONAL FACTORS: Past/current experiences and Time since onset of injury/illness/exacerbation are also affecting patient's functional outcome.    REHAB POTENTIAL: Good   CLINICAL DECISION MAKING: Evolving/moderate complexity   EVALUATION COMPLEXITY: Moderate     GOALS:   SHORT TERM GOALS: Target date: 08/15/22   Pt will be Ind in an initial HEP Baseline: Initiated Goal status: 08/16/22 MET   2.  Pt will voice understanding of measures to assist in pain reduction  Baseline: initiated Goal status: 08/16/22 MET     LONG TERM GOALS: Target date: 09/13/22   Pt will be Ind in a final HEP to maintain achieved LOF Baseline: initiated Goal status: INITIAL   2.  Pt will report a decrease in neck pain range to 0- 4/10 for improved function and QOL  Baseline: 2-10/10 Goal status: INITIAL   3.  Pt's neck AROM will incrrease by 10d for each motion for improved function with work and driving Baseline: see flow sheets Goal status: INITIAL   4.  Pt's FOTO score will improved to the predicted value of 53% as indication of improved function  Baseline: 41% Goal status: INITIAL   PLAN:   PT FREQUENCY: 2x/week   PT DURATION: 6 weeks   PLANNED INTERVENTIONS: Therapeutic exercises, Therapeutic activity, Patient/Family education, Self Care, Dry Needling, Electrical stimulation, Spinal mobilization, Cryotherapy, Moist heat, Taping, Traction, Ultrasound, Ionotophoresis '4mg'$ /ml Dexamethasone, Manual therapy, and Re-evaluation   PLAN FOR NEXT SESSION: recheck objective measure;  TPDN SCM/ sub occipitals; assess response to HEP; progress therex as indicated; use of modalities, manual therapy; and TPDN as indicated. Trial of cervical traction (pt has cervical fusion) , give standing  bands for HEP, recheck [employer] MS, PT 08/27/22 11:38 AM

## 2022-08-27 ENCOUNTER — Ambulatory Visit: Payer: 59

## 2022-08-27 DIAGNOSIS — R252 Cramp and spasm: Secondary | ICD-10-CM

## 2022-08-27 DIAGNOSIS — M542 Cervicalgia: Secondary | ICD-10-CM

## 2022-08-27 DIAGNOSIS — M6281 Muscle weakness (generalized): Secondary | ICD-10-CM

## 2022-08-29 ENCOUNTER — Encounter: Payer: Self-pay | Admitting: Physical Therapy

## 2022-08-29 ENCOUNTER — Ambulatory Visit: Payer: 59 | Admitting: Physical Therapy

## 2022-08-29 DIAGNOSIS — M542 Cervicalgia: Secondary | ICD-10-CM

## 2022-08-29 DIAGNOSIS — R252 Cramp and spasm: Secondary | ICD-10-CM

## 2022-08-29 DIAGNOSIS — M6281 Muscle weakness (generalized): Secondary | ICD-10-CM

## 2022-08-29 NOTE — Therapy (Signed)
OUTPATIENT PHYSICAL THERAPY TREATMENT NOTE   Patient Name: Brett Clark MRN: [id number] DOB:Apr 16, 1956, 67 y.o., male Today's Date: 08/29/2022  PCP: Maury Dus, MD   REFERRING PROVIDER: Kary Kos, MD END OF SESSION:   PT End of Session - 08/29/22 0717     Visit Number 9    Number of Visits 13    Date for PT Re-Evaluation 09/13/22    Authorization Type [employer] OTHER    PT Start Time [phone]    PT Stop Time 0802    PT Time Calculation (min) 46 min                Past Medical History:  Diagnosis Date   Abnormal abdominal CT scan    a. Abnl lesion on kidney/liver noted on CT @ [facility]) - no further details available.   Atypical mole 09/06/2003   Upper Lesion (moderate) (widershave)   Atypical mole 09/06/2003   Middle Back Lesion (moderate to severe) (excision)   Atypical mole 11/08/2003   Mid Low Back (moderate to severe) (excision)   Atypical mole 11/08/2003   Right Lower Shoulder (moderate)   Atypical mole 11/08/2003   Mid Upper Back (moderate)   Atypical mole 11/08/2003   Left Mid Back (moderate)   Atypical mole 11/08/2003   Mid Abdomen (moderate) (widershave)   Atypical mole 08/02/2004   Mid Lower Back (moderate)   Atypical mole 08/02/2004   Right Shoulder Sup (moderate) (widershave)   Atypical mole 08/02/2004   Right Shoulder Inf (moderate)   Atypical mole 08/02/2004   Right Scapula (moderate)   Atypical mole 06/22/2007   Left Shoulder (moderate to severe) Mindi Slicker)   Atypical mole 01/22/2005   Mid Back (slight to moderate) Mindi Slicker)   Atypical mole 09/28/2013   Right Lower Back (moderate to severe)   Atypical mole 06/04/2021   mod-severe-mid back (WS)   CAD (coronary artery disease)    a. 06/2014 NSTEMI/PCI: LM nl, LAD 80 (2.5x18 Xience DES), LCX nl, RCA nl, EF 40-45%.   Dyslipidemia    Headache    migraine and tension   Ischemic cardiomyopathy    a. 06/2014 EF 40-45% by LV gram;  b. 06/2014 Echo: EF 50-55%, mild LVH,  distal anterior, apical, and inferoapical wall motion abnormalities, DD, mild MR.   Myocardial infarction ([place]) 06/2014   Nodular basal cell carcinoma (BCC) 10/01/2017   Right Outer Forehead (excision)   Right renal mass    SCCA (squamous cell carcinoma) of skin 02/15/2020   Right Forearm-Posterior (well diff) TX CURET CAUTERY 5FU   Tobacco abuse    Past Surgical History:  Procedure Laterality Date   BACK SURGERY  years ago   neck   CARDIAC CATHETERIZATION  06/23/2014   DES to mid LAD, single v disease   hydrocele surgery  15 years ago   LEFT HEART CATHETERIZATION WITH CORONARY ANGIOGRAM N/A 06/23/2014   Procedure: LEFT HEART CATHETERIZATION WITH CORONARY ANGIOGRAM;  Surgeon: Peter M Martinique, MD;  Location: [facility] CATH LAB;  Service: Cardiovascular;  Laterality: N/A;   ROBOTIC ASSITED PARTIAL NEPHRECTOMY Right 01/18/2015   Procedure: ROBOTIC ASSITED PARTIAL NEPHRECTOMY, INTRAOPERATIVE ULTRASOUND, AND INDOCYANINE GREEN DYE;  Surgeon: Alexis Frock, MD;  Location: [facility] ORS;  Service: Urology;  Laterality: Right;   Patient Active Problem List   Diagnosis Date Noted   Nodular basal cell carcinoma 09/07/2018   Multiple atypical skin moles 09/07/2018   Impingement syndrome of right shoulder 03/31/2018   CKD (chronic kidney disease), stage III ([place]) 11/24/2017  Hypertension 11/18/2017   Renal cell carcinoma ([place]) 09/19/2015   CAD (coronary artery disease)    Hyperlipidemia    Ischemic cardiomyopathy    Tobacco abuse    CAD S/P PCI mLAD 2.5 x 18 mm Xience [place] DES 06/24/2014    Class: Status post   Non-STEMI (non-ST elevated myocardial infarction) ([place]) 06/22/2014   Abnormal abdominal CT scan 06/22/2014   Epigastric abdominal tenderness 06/22/2014    REFERRING DIAG: M54.12 (ICD-10-CM) - Radiculopathy, cervical, C6   THERAPY DIAG:  Cervicalgia  Cramp and spasm  Muscle weakness (generalized)  Rationale for Evaluation and Treatment rehabilitation  PERTINENT HISTORY:  C3-4 fusion  2009; CAD; ischemic cardiomyopathy  PRECAUTIONS: None  SUBJECTIVE:                                                                                                                                                                                      SUBJECTIVE STATEMENT: The neck is fair. I think I follow up with MD in February. The traction doesn't hurt but it doesn't really help.  PAIN:  Are you having pain? Yes: NPRS scale: 5-6/10 Pain location: base of skull and upper neck  Pain description: ache, sharp, constant, wrose at end of day, and head feels heavy Aggravating factors: End of day, looking up and turning head Relieving factors: Ibuprofen, hot shower On eval: pain range: 2-10/10. Ave pain 6/10   OBJECTIVE: (objective measures completed at initial evaluation unless otherwise dated)   DIAGNOSTIC FINDINGS:  MRI cervical 08/01/18     IMPRESSION: 1. Slight progression of disc bulges and small central disc protrusions at C4-5 and C5-6 with slight increased indentation upon the thecal sac and cervical spinal cord at both levels but without myelopathy. 2. Stable myelopathy at C3-4 with no residual neural impingement.   Pt reports a MRI by Dr. Saintclair Halsted approx 3 months ago-unavailable in Epic   PATIENT SURVEYS:  FOTO: Perceived function   41%, predicted   53%  08/29/22: 53%   COGNITION: Overall cognitive status: Within functional limits for tasks assessed   SENSATION: WFL   POSTURE: rounded shoulders, decreased lumbar lordosis, and decreased thoracic kyphosis   PALPATION: TTP of the suboccipital muscles bilat R>L   CERVICAL ROM:    Active ROM A/PROM (deg) eval AROM after TPDN 08/09/22 AROM after [facility] 08/14/22 08/20/22 Start of session  Flexion 21 upper neck pulling 20    Extension 11 upper neck pressure pain 20    Right lateral flexion 10 R post/lat neck pain 18    Left lateral flexion 5 R post/lat nack pain 10    Right rotation 10 post/lat neck pain 20 30 40  Left  rotation 20 blat upper shoulder pain 40 40 50   (Blank rows = not tested)   UPPER EXTREMITY ROM: Grossly WNLs and eqaul Active ROM Right eval Left eval  Shoulder flexion      Shoulder extension      Shoulder abduction      Shoulder adduction      Shoulder extension      Shoulder internal rotation      Shoulder external rotation      Elbow flexion      Elbow extension      Wrist flexion      Wrist extension      Wrist ulnar deviation      Wrist radial deviation      Wrist pronation      Wrist supination       (Blank rows = not tested)   UPPER EXTREMITY MMT: Myotome screen is negative. 5/5 strength and equal MMT Right eval Left eval  Shoulder flexion      Shoulder extension      Shoulder abduction      Shoulder adduction      Shoulder extension      Shoulder internal rotation      Shoulder external rotation      Middle trapezius      Lower trapezius      Elbow flexion      Elbow extension      Wrist flexion      Wrist extension      Wrist ulnar deviation      Wrist radial deviation      Wrist pronation      Wrist supination      Grip strength       (Blank rows = not tested)   CERVICAL SPECIAL TESTS:  Neck flexor muscle endurance test: Positive 3", Spurling's test: Negative, and Distraction test: Negative   FUNCTIONAL TESTS:  NT   TODAY'S TREATMENT:  [facility] Adult PT Treatment:                                                DATE: 08/29/22 Therapeutic Exercise: UBE L3 x 3 min each Green band horizontal abduction Green band star pattern Green band bilat ER with scap retract Modalities: Trial of E-stim:  IFC 61m  to posterior neck concurrent with HMP x 15 minutes    [facility] Adult PT Treatment:                                                DATE: 08/27/22 Manual Therapy: STM to the suboccipitals, cervical paraspinals, and upper traps bilat Suboccipital release UPA C2-C6 Manual traction Modalities: Pneumatic cervical traction x10 mins at 22 psi  [facility] Adult  PT Treatment:                                                DATE: 08/22/22 Therapeutic Exercise: Cervical retraction x5 3" Cervical rotation c pt over pressure x3 15" Cervical side bending c pt over pressure x3 15" Manual Therapy: STM to the suboccipitals, cervical paraspinals, and upper traps bilat Suboccipital  release UPA C2-C6 Modalities: Pneumatic cervical traction x10 mins at 20# Self Care: Use of home traction  [facility] Adult PT Treatment:                                                DATE: 08/20/22 Therapeutic Exercise: Supine with head on ball- cervical rotation AROM, nods, chin tuck with cervical rotations Supine blue band horizontal abduction Supine star pattern with blue Seated SCM stretch bilat - Seated levator stretch Standing green band rows  Manual Therapy: STW to cercical paraspinals , sub occipitals, SCM Contract relax cervical rotation , right   [facility] Adult PT Treatment:                                                DATE: 08/16/22 Therapeutic Exercise: seated chin tuck x5 3" Seated upper trap stretch x2 15' each Seated chin tuck c side bending x2 15" each Seated chin tuck c rotation x2 15" each Shoulder hor abd star pattern 2x5 patterns GTB Shoulder ER  2x10 GTB Manual Therapy: STM to the suboccipitals, cervical paraspinals, and upper traps bilat Suboccipital release UPA C2-C5 Contact/relax cervical side bending ROM Skilled palpation to identify TrPs and taut muscle bands  Trigger Point Dry Needling Treatment: Pre-treatment instruction: Patient instructed on dry needling rationale, procedures, and possible side effects including pain during treatment (achy,cramping feeling), bruising, drop of blood, lightheadedness, nausea, sweating. Patient Consent Given: Yes Education handout provided: Previously provided Muscles treated: upper traps bliat  Needle size and number: .30x55m x 2;  .30x320mx 2 Electrical stimulation performed: No Parameters: N/A Treatment  response/outcome: Muscle twitch and increased muscle lengthening Post-treatment instructions: Patient instructed to expect possible mild to moderate muscle soreness later today and/or tomorrow. Patient instructed in methods to reduce muscle soreness and to continue prescribed HEP. If patient was dry needled over the lung field, patient was instructed on signs and symptoms of pneumothorax and, however unlikely, to see immediate medical attention should they occur. Patient was also educated on signs and symptoms of infection and to seek medical attention should they occur. Patient verbalized understanding of these instructions and education.    PATIENT EDUCATION:  Education details: Eval findings, POC, HEP, self care  Person educated: Patient Education method: Explanation, Demonstration, Tactile cues, Verbal cues, and Handouts Education comprehension: verbalized understanding, returned demonstration, verbal cues required, and tactile cues required   HOME EXERCISE PROGRAM: Access Code: [email or web address] URL: [email or web address] Date: 07/24/2022 Prepared by: AlGar Ponto Exercises - Supine Cervical Retraction with Towel  - 2 x daily - 7 x weekly - 1 sets - 5 reps - 5 hold - Supine Deep Neck Flexor Training - Repetitions  - 1 x daily - 7 x weekly - 1 sets - 5 reps - 10 hold - Seated Passive Cervical Retraction  - 6 x daily - 7 x weekly - 1 sets - 3-5 reps - 5 hold - Seated Upper Trapezius Stretch  - 6 x daily - 7 x weekly - 1 sets - 3-5 reps - 15 hold - Standing Cervical Rotation AROM with Overpressure  - 6 x daily - 7 x weekly - 1 sets - 3-5 reps - 15 hold Added 08/07/22 - Gentle Levator Scapulae Stretch  -  1 x daily - 7 x weekly - 1 sets - 3-5 reps - 15 hold Added 08/09/22 - Sternocleidomastoid Stretch  - 1 x daily - 7 x weekly - 1 sets - 3 reps - 15-20 hold Added 08/28/22 - Alternating star pattern  - 1 x daily - 7 x weekly - 3 sets - 10 reps - Standing Shoulder External Rotation with  Resistance  - 1 x daily - 7 x weekly - 3 sets - 10 reps    ASSESSMENT:   CLINICAL IMPRESSION: Pt reports no change with cervical traction. Updated HEP today to include scapular stabilization. He tolerated this therex without increased neck pain. Performed a trial of Electrical stimulation for pain management. Pt reports a good reduction of pain during and immediately after treatment. Will assess longer term response at next session. He was given information on where to purchase TENS unit if interested. FOTO status indicates improvement in function, reaching the predicted target of 53%. LTG #4 met. Pt reports he is improving with his neck mobility.    OBJECTIVE IMPAIRMENTS: decreased activity tolerance, decreased ROM, decreased strength, increased fascial restrictions, increased muscle spasms, postural dysfunction, and pain.    ACTIVITY LIMITATIONS: reach over head and looking up   PARTICIPATION LIMITATIONS: driving and occupation   PERSONAL FACTORS: Past/current experiences and Time since onset of injury/illness/exacerbation are also affecting patient's functional outcome.    REHAB POTENTIAL: Good   CLINICAL DECISION MAKING: Evolving/moderate complexity   EVALUATION COMPLEXITY: Moderate     GOALS:   SHORT TERM GOALS: Target date: 08/15/22   Pt will be Ind in an initial HEP Baseline: Initiated Goal status: 08/16/22 MET   2.  Pt will voice understanding of measures to assist in pain reduction  Baseline: initiated Goal status: 08/16/22 MET     LONG TERM GOALS: Target date: 09/13/22   Pt will be Ind in a final HEP to maintain achieved LOF Baseline: initiated 08/29/22: progressed HEP Goal status: ONGOING   2.  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  Pt's neck AROM will incrrease by 10d for each motion for improved  function with work and driving Baseline: see flow sheets Goal status: PARTIALLY MET   4.  Pt's FOTO score will improved to the predicted value of 53% as indication of improved function  Baseline: 41% 08/29/22: 53% Goal status: MET   PLAN:   PT FREQUENCY: 2x/week   PT DURATION: 6 weeks   PLANNED INTERVENTIONS: Therapeutic exercises, Therapeutic activity, Patient/Family education, Self Care, Dry Needling, Electrical stimulation, Spinal mobilization, Cryotherapy, Moist heat, Taping, Traction, Ultrasound, Ionotophoresis '4mg'$ /ml Dexamethasone, Manual therapy, and Re-evaluation   PLAN FOR NEXT SESSION: assess response to IFC, did he purchase a TENS unit? Finalize HEP and begin DC prep.  Hessie Diener, PTA 08/29/22 7:59 AM Phone: [phone] Fax: [phone]

## 2022-08-29 NOTE — Patient Instructions (Signed)

## 2022-09-03 ENCOUNTER — Ambulatory Visit: Payer: 59 | Admitting: Physical Therapy

## 2022-09-03 ENCOUNTER — Encounter: Payer: Self-pay | Admitting: Physical Therapy

## 2022-09-03 DIAGNOSIS — R252 Cramp and spasm: Secondary | ICD-10-CM

## 2022-09-03 DIAGNOSIS — M542 Cervicalgia: Secondary | ICD-10-CM | POA: Diagnosis not present

## 2022-09-03 DIAGNOSIS — M6281 Muscle weakness (generalized): Secondary | ICD-10-CM

## 2022-09-03 NOTE — Therapy (Signed)
OUTPATIENT PHYSICAL THERAPY TREATMENT NOTE   Patient Name: Brett Clark MRN: [id number] DOB:[birth year], 67 y.o., male Today's Date: 09/03/2022  PCP: Maury Dus, MD   REFERRING PROVIDER: Kary Kos, MD END OF SESSION:   PT End of Session - 09/03/22 0724     Visit Number 10    Number of Visits 13    Date for PT Re-Evaluation 09/13/22    Authorization Type [employer] OTHER    PT Start Time 0720    PT Stop Time 0750    PT Time Calculation (min) 30 min                Past Medical History:  Diagnosis Date   Abnormal abdominal CT scan    a. Abnl lesion on kidney/liver noted on CT @ [facility]) - no further details available.   Atypical mole 09/06/2003   Upper Lesion (moderate) (widershave)   Atypical mole 09/06/2003   Middle Back Lesion (moderate to severe) (excision)   Atypical mole 11/08/2003   Mid Low Back (moderate to severe) (excision)   Atypical mole 11/08/2003   Right Lower Shoulder (moderate)   Atypical mole 11/08/2003   Mid Upper Back (moderate)   Atypical mole 11/08/2003   Left Mid Back (moderate)   Atypical mole 11/08/2003   Mid Abdomen (moderate) (widershave)   Atypical mole 08/02/2004   Mid Lower Back (moderate)   Atypical mole 08/02/2004   Right Shoulder Sup (moderate) (widershave)   Atypical mole 08/02/2004   Right Shoulder Inf (moderate)   Atypical mole 08/02/2004   Right Scapula (moderate)   Atypical mole 06/22/2007   Left Shoulder (moderate to severe) Mindi Slicker)   Atypical mole 01/22/2005   Mid Back (slight to moderate) Mindi Slicker)   Atypical mole 09/28/2013   Right Lower Back (moderate to severe)   Atypical mole 06/04/2021   mod-severe-mid back (WS)   CAD (coronary artery disease)    a. 06/2014 NSTEMI/PCI: LM nl, LAD 80 (2.5x18 Xience DES), LCX nl, RCA nl, EF 40-45%.   Dyslipidemia    Headache    migraine and tension   Ischemic cardiomyopathy    a. 06/2014 EF 40-45% by LV gram;  b. 06/2014 Echo: EF 50-55%, mild  LVH, distal anterior, apical, and inferoapical wall motion abnormalities, DD, mild MR.   Myocardial infarction ([place]) 06/2014   Nodular basal cell carcinoma (BCC) 10/01/2017   Right Outer Forehead (excision)   Right renal mass    SCCA (squamous cell carcinoma) of skin 02/15/2020   Right Forearm-Posterior (well diff) [place] CURET CAUTERY 5FU   Tobacco abuse    Past Surgical History:  Procedure Laterality Date   BACK SURGERY  years ago   neck   CARDIAC CATHETERIZATION  06/23/2014   DES to mid LAD, single v disease   hydrocele surgery  15 years ago   LEFT HEART CATHETERIZATION WITH CORONARY ANGIOGRAM N/A 06/23/2014   Procedure: LEFT HEART CATHETERIZATION WITH CORONARY ANGIOGRAM;  Surgeon: Peter M Martinique, MD;  Location: [facility] CATH LAB;  Service: Cardiovascular;  Laterality: N/A;   ROBOTIC ASSITED PARTIAL NEPHRECTOMY Right 01/18/2015   Procedure: ROBOTIC ASSITED PARTIAL NEPHRECTOMY, INTRAOPERATIVE ULTRASOUND, AND INDOCYANINE GREEN DYE;  Surgeon: Alexis Frock, MD;  Location: [facility] ORS;  Service: Urology;  Laterality: Right;   Patient Active Problem List   Diagnosis Date Noted   Nodular basal cell carcinoma 09/07/2018   Multiple atypical skin moles 09/07/2018   Impingement syndrome of right shoulder 03/31/2018   CKD (chronic kidney disease), stage III ([place]) 11/24/2017  Hypertension 11/18/2017   Renal cell carcinoma ([place]) 09/19/2015   CAD (coronary artery disease)    Hyperlipidemia    Ischemic cardiomyopathy    Tobacco abuse    CAD S/P PCI mLAD 2.5 x 18 mm Xience [place] DES 06/24/2014    Class: Status post   Non-STEMI (non-ST elevated myocardial infarction) ([place]) 06/22/2014   Abnormal abdominal CT scan 06/22/2014   Epigastric abdominal tenderness 06/22/2014    REFERRING DIAG: M54.12 (ICD-10-CM) - Radiculopathy, cervical, C6   THERAPY DIAG:  Cervicalgia  Cramp and spasm  Muscle weakness (generalized)  Rationale for Evaluation and Treatment rehabilitation  PERTINENT HISTORY:  C3-4  fusion 2009; CAD; ischemic cardiomyopathy  PRECAUTIONS: None  SUBJECTIVE:                                                                                                                                                                                      SUBJECTIVE STATEMENT: I bought a TENS unit and the neck has been great since last time.   The neck is fair. I think I follow up with MD in February. The traction doesn't hurt but it doesn't really help.  PAIN:  Are you having pain? Yes: NPRS scale: 5-6/10 Pain location: base of skull and upper neck  Pain description: ache, sharp, constant, wrose at end of day, and head feels heavy Aggravating factors: End of day, looking up and turning head Relieving factors: Ibuprofen, hot shower On eval: pain range: 2-10/10. Ave pain 6/10   OBJECTIVE: (objective measures completed at initial evaluation unless otherwise dated)   DIAGNOSTIC FINDINGS:  MRI cervical 08/01/18     IMPRESSION: 1. Slight progression of disc bulges and small central disc protrusions at C4-5 and C5-6 with slight increased indentation upon the thecal sac and cervical spinal cord at both levels but without myelopathy. 2. Stable myelopathy at C3-4 with no residual neural impingement.   Pt reports a MRI by Dr. Saintclair Halsted approx 3 months ago-unavailable in Epic   PATIENT SURVEYS:  FOTO: Perceived function   41%, predicted   53%  08/29/22: 53%   COGNITION: Overall cognitive status: Within functional limits for tasks assessed   SENSATION: WFL   POSTURE: rounded shoulders, decreased lumbar lordosis, and decreased thoracic kyphosis   PALPATION: TTP of the suboccipital muscles bilat R>L   CERVICAL ROM:    Active ROM A/PROM (deg) eval AROM after TPDN 08/09/22 AROM after [facility] 08/14/22 08/20/22 Start of session 09/03/22 AROM  Flexion 21 upper neck pulling 20   26  Extension 11 upper neck pressure pain 20   30  Right lateral flexion 10 R post/lat neck pain 18   20  Left  lateral flexion 5 R post/lat nack pain 10   18  Right rotation 10 post/lat neck pain 20 30 40   Left rotation 20 blat upper shoulder pain 40 40 50    (Blank rows = not tested)   UPPER EXTREMITY ROM: Grossly WNLs and eqaul Active ROM Right eval Left eval  Shoulder flexion      Shoulder extension      Shoulder abduction      Shoulder adduction      Shoulder extension      Shoulder internal rotation      Shoulder external rotation      Elbow flexion      Elbow extension      Wrist flexion      Wrist extension      Wrist ulnar deviation      Wrist radial deviation      Wrist pronation      Wrist supination       (Blank rows = not tested)   UPPER EXTREMITY MMT: Myotome screen is negative. 5/5 strength and equal MMT Right eval Left eval  Shoulder flexion      Shoulder extension      Shoulder abduction      Shoulder adduction      Shoulder extension      Shoulder internal rotation      Shoulder external rotation      Middle trapezius      Lower trapezius      Elbow flexion      Elbow extension      Wrist flexion      Wrist extension      Wrist ulnar deviation      Wrist radial deviation      Wrist pronation      Wrist supination      Grip strength       (Blank rows = not tested)   CERVICAL SPECIAL TESTS:  Neck flexor muscle endurance test: Positive 3", Spurling's test: Negative, and Distraction test: Negative Neck Flexor muscle Endurance 12 sec    FUNCTIONAL TESTS:  NT   TODAY'S TREATMENT:  [facility] Adult PT Treatment:                                                DATE: 09/03/22 Therapeutic Exercise: Standing rows Green Standing shoulder ext Green Standing star pattern Standing shoulder ER with Retract Levator stretches SCM stretch    [facility] Adult PT Treatment:                                                DATE: 08/29/22 Therapeutic Exercise: UBE L3 x 3 min each Green band horizontal abduction Green band star pattern Green band bilat ER with scap  retract Modalities: Trial of E-stim:  IFC 33m  to posterior neck concurrent with HMP x 15 minutes    [facility] Adult PT Treatment:                                                DATE: 08/27/22 Manual Therapy: STM to the suboccipitals, cervical paraspinals,  and upper traps bilat Suboccipital release UPA C2-C6 Manual traction Modalities: Pneumatic cervical traction x10 mins at 22 psi  [facility] Adult PT Treatment:                                                DATE: 08/22/22 Therapeutic Exercise: Cervical retraction x5 3" Cervical rotation c pt over pressure x3 15" Cervical side bending c pt over pressure x3 15" Manual Therapy: STM to the suboccipitals, cervical paraspinals, and upper traps bilat Suboccipital release UPA C2-C6 Modalities: Pneumatic cervical traction x10 mins at 20# Self Care: Use of home traction  [facility] Adult PT Treatment:                                                DATE: 08/20/22 Therapeutic Exercise: Supine with head on ball- cervical rotation AROM, nods, chin tuck with cervical rotations Supine blue band horizontal abduction Supine star pattern with blue Seated SCM stretch bilat - Seated levator stretch Standing green band rows  Manual Therapy: STW to cercical paraspinals , sub occipitals, SCM Contract relax cervical rotation , right   [facility] Adult PT Treatment:                                                DATE: 08/16/22 Therapeutic Exercise: seated chin tuck x5 3" Seated upper trap stretch x2 15' each Seated chin tuck c side bending x2 15" each Seated chin tuck c rotation x2 15" each Shoulder hor abd star pattern 2x5 patterns GTB Shoulder ER  2x10 GTB Manual Therapy: STM to the suboccipitals, cervical paraspinals, and upper traps bilat Suboccipital release UPA C2-C5 Contact/relax cervical side bending ROM Skilled palpation to identify TrPs and taut muscle bands  Trigger Point Dry Needling Treatment: Pre-treatment instruction: Patient instructed on dry  needling rationale, procedures, and possible side effects including pain during treatment (achy,cramping feeling), bruising, drop of blood, lightheadedness, nausea, sweating. Patient Consent Given: Yes Education handout provided: Previously provided Muscles treated: upper traps bliat  Needle size and number: .30x34m x 2;  .30x381mx 2 Electrical stimulation performed: No Parameters: N/A Treatment response/outcome: Muscle twitch and increased muscle lengthening Post-treatment instructions: Patient instructed to expect possible mild to moderate muscle soreness later today and/or tomorrow. Patient instructed in methods to reduce muscle soreness and to continue prescribed HEP. If patient was dry needled over the lung field, patient was instructed on signs and symptoms of pneumothorax and, however unlikely, to see immediate medical attention should they occur. Patient was also educated on signs and symptoms of infection and to seek medical attention should they occur. Patient verbalized understanding of these instructions and education.    PATIENT EDUCATION:  Education details: Eval findings, POC, HEP, self care  Person educated: Patient Education method: Explanation, Demonstration, Tactile cues, Verbal cues, and Handouts Education comprehension: verbalized understanding, returned demonstration, verbal cues required, and tactile cues required   HOME EXERCISE PROGRAM: Access Code: [email or web address] URL: [email or web address] Date: 07/24/2022 Prepared by: AlGar Ponto Exercises - Supine Cervical Retraction with Towel  - 2 x daily - 7 x weekly - 1 sets -  5 reps - 5 hold - Supine Deep Neck Flexor Training - Repetitions  - 1 x daily - 7 x weekly - 1 sets - 5 reps - 10 hold - Seated Passive Cervical Retraction  - 6 x daily - 7 x weekly - 1 sets - 3-5 reps - 5 hold - Seated Upper Trapezius Stretch  - 6 x daily - 7 x weekly - 1 sets - 3-5 reps - 15 hold - Standing Cervical Rotation AROM with  Overpressure  - 6 x daily - 7 x weekly - 1 sets - 3-5 reps - 15 hold Added 08/07/22 - Gentle Levator Scapulae Stretch  - 1 x daily - 7 x weekly - 1 sets - 3-5 reps - 15 hold Added 08/09/22 - Sternocleidomastoid Stretch  - 1 x daily - 7 x weekly - 1 sets - 3 reps - 15-20 hold Added 08/28/22 - Alternating star pattern  - 1 x daily - 7 x weekly - 3 sets - 10 reps - Standing Shoulder External Rotation with Resistance  - 1 x daily - 7 x weekly - 3 sets - 10 reps    ASSESSMENT:   CLINICAL IMPRESSION: Pt reports significant decrease in pain after Estim last visit. He purchased a TENS unit and has been using it daily since last visit. He is pleased with his response to the TENS. His AROM of cervical has improved in all planes. Today his pain is rated at a 2/10 which is significantly decreased. Reviewed HEP and discussed discharge. Will cancel the next appt this week and plan to discharge next visit if he is still doing well. .    OBJECTIVE IMPAIRMENTS: decreased activity tolerance, decreased ROM, decreased strength, increased fascial restrictions, increased muscle spasms, postural dysfunction, and pain.    ACTIVITY LIMITATIONS: reach over head and looking up   PARTICIPATION LIMITATIONS: driving and occupation   PERSONAL FACTORS: Past/current experiences and Time since onset of injury/illness/exacerbation are also affecting patient's functional outcome.    REHAB POTENTIAL: Good   CLINICAL DECISION MAKING: Evolving/moderate complexity   EVALUATION COMPLEXITY: Moderate     GOALS:   SHORT TERM GOALS: Target date: 08/15/22   Pt will be Ind in an initial HEP Baseline: Initiated Goal status: 08/16/22 MET   2.  Pt will voice understanding of measures to assist in pain reduction  Baseline: initiated Goal status: 08/16/22 MET     LONG TERM GOALS: Target date: 09/13/22   Pt will be Ind in a final HEP to maintain achieved LOF Baseline: initiated 08/29/22: progressed HEP Goal status: ONGOING   2.   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  Goal status: PARTIALLY MET   3.  Pt's neck AROM will incrrease by 10d for each motion for improved function with work and driving Baseline: see flow sheets Goal status: MET   4.  Pt's FOTO score will improved to the predicted value of 53% as indication of improved function  Baseline: 41% 08/29/22: 53% Goal status: MET   PLAN:   PT FREQUENCY: 2x/week   PT DURATION: 6 weeks   PLANNED INTERVENTIONS: Therapeutic exercises, Therapeutic activity, Patient/Family education, Self Care, Dry Needling, Electrical stimulation, Spinal mobilization, Cryotherapy, Moist heat, Taping, Traction, Ultrasound, Ionotophoresis '4mg'$ /ml Dexamethasone, Manual therapy, and Re-evaluation   PLAN FOR NEXT SESSION:  How was working on the [employer]? TENS still helpful, Likely DC next visit.    Hessie Diener, PTA 09/03/22 10:05 AM Phone: [phone] Fax: [phone]

## 2022-09-05 ENCOUNTER — Ambulatory Visit: Payer: 59

## 2022-09-10 ENCOUNTER — Encounter: Payer: Self-pay | Admitting: Physical Therapy

## 2022-09-10 ENCOUNTER — Ambulatory Visit: Payer: 59 | Attending: Neurosurgery | Admitting: Physical Therapy

## 2022-09-10 DIAGNOSIS — M6281 Muscle weakness (generalized): Secondary | ICD-10-CM | POA: Diagnosis present

## 2022-09-10 DIAGNOSIS — R252 Cramp and spasm: Secondary | ICD-10-CM | POA: Diagnosis present

## 2022-09-10 DIAGNOSIS — M542 Cervicalgia: Secondary | ICD-10-CM | POA: Diagnosis present

## 2022-09-10 NOTE — Therapy (Addendum)
OUTPATIENT PHYSICAL THERAPY TREATMENT NOTE/Discharge   Patient Name: Brett Clark MRN: [id number] DOB:1956/04/27, 67 y.o., male Today's Date: 09/10/2022  PCP: Maury Dus, MD   REFERRING PROVIDER: Kary Kos, MD END OF SESSION:   PT End of Session - 09/10/22 0717     Visit Number 11    Number of Visits 13    Date for PT Re-Evaluation 09/13/22    Authorization Type [employer] OTHER    PT Start Time 0715    PT Stop Time 0745    PT Time Calculation (min) 30 min                Past Medical History:  Diagnosis Date   Abnormal abdominal CT scan    a. Abnl lesion on kidney/liver noted on CT @ OSH [facility]) - no further details available.   Atypical mole 09/06/2003   Upper Lesion (moderate) (widershave)   Atypical mole 09/06/2003   Middle Back Lesion (moderate to severe) (excision)   Atypical mole 11/08/2003   Mid Low Back (moderate to severe) (excision)   Atypical mole 11/08/2003   Right Lower Shoulder (moderate)   Atypical mole 11/08/2003   Mid Upper Back (moderate)   Atypical mole 11/08/2003   Left Mid Back (moderate)   Atypical mole 11/08/2003   Mid Abdomen (moderate) (widershave)   Atypical mole 08/02/2004   Mid Lower Back (moderate)   Atypical mole 08/02/2004   Right Shoulder Sup (moderate) (widershave)   Atypical mole 08/02/2004   Right Shoulder Inf (moderate)   Atypical mole 08/02/2004   Right Scapula (moderate)   Atypical mole 06/22/2007   Left Shoulder (moderate to severe) Mindi Slicker)   Atypical mole 01/22/2005   Mid Back (slight to moderate) Mindi Slicker)   Atypical mole 09/28/2013   Right Lower Back (moderate to severe)   Atypical mole 06/04/2021   mod-severe-mid back (WS)   CAD (coronary artery disease)    a. 06/2014 NSTEMI/PCI: LM nl, LAD 80 (2.5x18 Xience DES), LCX nl, RCA nl, EF 40-45%.   Dyslipidemia    Headache    migraine and tension   Ischemic cardiomyopathy    a. 06/2014 EF 40-45% by LV gram;  b. 06/2014 Echo: EF 50-55%,  mild LVH, distal anterior, apical, and inferoapical wall motion abnormalities, DD, mild MR.   Myocardial infarction ([place]) 06/2014   Nodular basal cell carcinoma (BCC) 10/01/2017   Right Outer Forehead (excision)   Right renal mass    SCCA (squamous cell carcinoma) of skin 02/15/2020   Right Forearm-Posterior (well diff) [place] CURET CAUTERY 5FU   Tobacco abuse    Past Surgical History:  Procedure Laterality Date   BACK SURGERY  years ago   neck   CARDIAC CATHETERIZATION  06/23/2014   DES to mid LAD, single v disease   hydrocele surgery  15 years ago   LEFT HEART CATHETERIZATION WITH CORONARY ANGIOGRAM N/A 06/23/2014   Procedure: LEFT HEART CATHETERIZATION WITH CORONARY ANGIOGRAM;  Surgeon: Peter M Martinique, MD;  Location: [facility] CATH LAB;  Service: Cardiovascular;  Laterality: N/A;   ROBOTIC ASSITED PARTIAL NEPHRECTOMY Right 01/18/2015   Procedure: ROBOTIC ASSITED PARTIAL NEPHRECTOMY, INTRAOPERATIVE ULTRASOUND, AND INDOCYANINE GREEN DYE;  Surgeon: Alexis Frock, MD;  Location: [facility] ORS;  Service: Urology;  Laterality: Right;   Patient Active Problem List   Diagnosis Date Noted   Nodular basal cell carcinoma 09/07/2018   Multiple atypical skin moles 09/07/2018   Impingement syndrome of right shoulder 03/31/2018   CKD (chronic kidney disease), stage III ([place]) 11/24/2017  Hypertension 11/18/2017   Renal cell carcinoma ([place]) 09/19/2015   CAD (coronary artery disease)    Hyperlipidemia    Ischemic cardiomyopathy    Tobacco abuse    CAD S/P PCI mLAD 2.5 x 18 mm Xience [place] DES 06/24/2014    Class: Status post   Non-STEMI (non-ST elevated myocardial infarction) ([place]) 06/22/2014   Abnormal abdominal CT scan 06/22/2014   Epigastric abdominal tenderness 06/22/2014    REFERRING DIAG: M54.12 (ICD-10-CM) - Radiculopathy, cervical, C6   THERAPY DIAG:  Cervicalgia  Cramp and spasm  Muscle weakness (generalized)  Rationale for Evaluation and Treatment rehabilitation  PERTINENT HISTORY:   C3-4 fusion 2009; CAD; ischemic cardiomyopathy  PRECAUTIONS: None  SUBJECTIVE:                                                                                                                                                                                      SUBJECTIVE STATEMENT: I bought a TENS unit and the neck has been great since last time.     PAIN:  Are you having pain? Yes: NPRS scale: 5-6/10 Pain location: base of skull and upper neck  Pain description: ache, sharp, constant, wrose at end of day, and head feels heavy Aggravating factors: End of day, looking up and turning head Relieving factors: Ibuprofen, hot shower On eval: pain range: 2-10/10. Ave pain 6/10   OBJECTIVE: (objective measures completed at initial evaluation unless otherwise dated)   DIAGNOSTIC FINDINGS:  MRI cervical 08/01/18     IMPRESSION: 1. Slight progression of disc bulges and small central disc protrusions at C4-5 and C5-6 with slight increased indentation upon the thecal sac and cervical spinal cord at both levels but without myelopathy. 2. Stable myelopathy at C3-4 with no residual neural impingement.   Pt reports a MRI by Dr. Saintclair Halsted approx 3 months ago-unavailable in Epic   PATIENT SURVEYS:  FOTO: Perceived function   41%, predicted   53%  08/29/22: 53%   COGNITION: Overall cognitive status: Within functional limits for tasks assessed   SENSATION: WFL   POSTURE: rounded shoulders, decreased lumbar lordosis, and decreased thoracic kyphosis   PALPATION: TTP of the suboccipital muscles bilat R>L   CERVICAL ROM:    Active ROM A/PROM (deg) eval AROM after TPDN 08/09/22 AROM after TPDN 08/14/22 08/20/22 Start of session 09/03/22 AROM 09/10/22 AROM  Flexion 21 upper neck pulling 20   26 30  $ Extension 11 upper neck pressure pain 20   30 34  Right lateral flexion 10 R post/lat neck pain 18   20 22  $ Left lateral flexion 5 R post/lat nack pain 10   18 22  $ Right rotation 10 post/lat  neck  pain 20 30 40  42  Left rotation 20 blat upper shoulder pain 40 40 50  52   (Blank rows = not tested)   UPPER EXTREMITY ROM: Grossly WNLs and eqaul Active ROM Right eval Left eval  Shoulder flexion      Shoulder extension      Shoulder abduction      Shoulder adduction      Shoulder extension      Shoulder internal rotation      Shoulder external rotation      Elbow flexion      Elbow extension      Wrist flexion      Wrist extension      Wrist ulnar deviation      Wrist radial deviation      Wrist pronation      Wrist supination       (Blank rows = not tested)   UPPER EXTREMITY MMT: Myotome screen is negative. 5/5 strength and equal MMT Right eval Left eval  Shoulder flexion      Shoulder extension      Shoulder abduction      Shoulder adduction      Shoulder extension      Shoulder internal rotation      Shoulder external rotation      Middle trapezius      Lower trapezius      Elbow flexion      Elbow extension      Wrist flexion      Wrist extension      Wrist ulnar deviation      Wrist radial deviation      Wrist pronation      Wrist supination      Grip strength       (Blank rows = not tested)   CERVICAL SPECIAL TESTS:  Neck flexor muscle endurance test: Positive 3", Spurling's test: Negative, and Distraction test: Negative Neck Flexor muscle Endurance 12 sec  09/10/22: Neck Flexor Endurance : 60 sec    FUNCTIONAL TESTS:  NT   TODAY'S TREATMENT:  [facility] Adult PT Treatment:                                                DATE: 09/10/22 Therapeutic Exercise: UBE L2 3 min each Standing  rows  Standing ext Standing star pattern Standing shoulder ER with retract Levator stretches SCM stretch    [facility] Adult PT Treatment:                                                DATE: 09/03/22 Therapeutic Exercise: Standing rows Green Standing shoulder ext Green Standing star pattern Standing shoulder ER with Retract Levator stretches SCM stretch    [facility]  Adult PT Treatment:                                                DATE: 08/29/22 Therapeutic Exercise: UBE L3 x 3 min each Green band horizontal abduction Green band star pattern Green band bilat ER with scap retract Modalities: Trial of E-stim:  IFC 40m  to posterior neck concurrent with HMP x 15 minutes    [facility] Adult PT Treatment:                                                DATE: 08/27/22 Manual Therapy: STM to the suboccipitals, cervical paraspinals, and upper traps bilat Suboccipital release UPA C2-C6 Manual traction Modalities: Pneumatic cervical traction x10 mins at 22 psi       PATIENT EDUCATION:  Education details: Eval findings, POC, HEP, self care  Person educated: Patient Education method: Explanation, Demonstration, Tactile cues, Verbal cues, and Handouts Education comprehension: verbalized understanding, returned demonstration, verbal cues required, and tactile cues required   HOME EXERCISE PROGRAM: Access Code: [email or web address] URL: [email or web address] Date: 07/24/2022 Prepared by: AGar Ponto  Exercises - Supine Cervical Retraction with Towel  - 2 x daily - 7 x weekly - 1 sets - 5 reps - 5 hold - Supine Deep Neck Flexor Training - Repetitions  - 1 x daily - 7 x weekly - 1 sets - 5 reps - 10 hold - Seated Passive Cervical Retraction  - 6 x daily - 7 x weekly - 1 sets - 3-5 reps - 5 hold - Seated Upper Trapezius Stretch  - 6 x daily - 7 x weekly - 1 sets - 3-5 reps - 15 hold - Standing Cervical Rotation AROM with Overpressure  - 6 x daily - 7 x weekly - 1 sets - 3-5 reps - 15 hold Added 08/07/22 - Gentle Levator Scapulae Stretch  - 1 x daily - 7 x weekly - 1 sets - 3-5 reps - 15 hold Added 08/09/22 - Sternocleidomastoid Stretch  - 1 x daily - 7 x weekly - 1 sets - 3 reps - 15-20 hold Added 08/28/22 - Alternating star pattern  - 1 x daily - 7 x weekly - 3 sets - 10 reps - Standing Shoulder External Rotation with Resistance  - 1 x daily - 7 x weekly -  3 sets - 10 reps    ASSESSMENT:   CLINICAL IMPRESSION: Pt reports continued pain decrease. He has purchased a second TENS to have at work. His cervical AROM is significantly improved. He reports improved rotation with driving and continued difficulty with backing large equipment which requires his to strain to look over shouler. Otherwise he is pleased with his current LOF and has learned pain management strategies with HEP and TENS. He has met most LTGs. His pain range can be 0-5/10 with average lately 3/10 or less. LTG# 2 partially met. He is agreeable to discharge at this time and was educated on the process to return with a new referral as needed in the future.    OBJECTIVE IMPAIRMENTS: decreased activity tolerance, decreased ROM, decreased strength, increased fascial restrictions, increased muscle spasms, postural dysfunction, and pain.    ACTIVITY LIMITATIONS: reach over head and looking up   PARTICIPATION LIMITATIONS: driving and occupation   PERSONAL FACTORS: Past/current experiences and Time since onset of injury/illness/exacerbation are also affecting patient's functional outcome.    REHAB POTENTIAL: Good   CLINICAL DECISION MAKING: Evolving/moderate complexity   EVALUATION COMPLEXITY: Moderate     GOALS:   SHORT TERM GOALS: Target date: 08/15/22   Pt will be Ind in an initial HEP Baseline: Initiated Goal status: 08/16/22 MET   2.  Pt will voice understanding of  measures to assist in pain reduction  Baseline: initiated Goal status: 08/16/22 MET     LONG TERM GOALS: Target date: 09/13/22   Pt will be Ind in a final HEP to maintain achieved LOF Baseline: initiated 08/29/22: progressed HEP Goal status: MET   2.  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  Status: 09/10/22:  pain has reached 4-5/10 , pain doesn't last as long and can be managed with TENS  Goal status: PARTIALLY MET   3.  Pt's neck AROM will incrrease by 10d for each motion for improved function with work and driving Baseline: see flow sheets Goal status: MET   4.  Pt's FOTO score will improved to the predicted value of 53% as indication of improved function  Baseline: 41% 08/29/22: 53% Goal status: MET   PLAN:   PT FREQUENCY: 2x/week   PT DURATION: 6 weeks   PLANNED INTERVENTIONS: Therapeutic exercises, Therapeutic activity, Patient/Family education, Self Care, Dry Needling, Electrical stimulation, Spinal mobilization, Cryotherapy, Moist heat, Taping, Traction, Ultrasound, Ionotophoresis 4m/ml Dexamethasone, Manual therapy, and Re-evaluation   PLAN FOR NEXT SESSION: N/A , DC to HEP today.    JHessie Diener PTA 09/10/22 7:47 AM Phone: [phone]: [phone]  PHYSICAL THERAPY DISCHARGE SUMMARY  Visits from Start of Care: 11  Current functional level related to goals / functional outcomes: See clinical impression and PT goals    Remaining deficits: See clinical impression and PT goals    Education / Equipment: HEP, use of Tens   Patient agrees to discharge. Patient goals were partially met. Patient is being discharged due to being pleased with the current functional level.  Allen Ralls MS, PT 09/17/22 8:45 AM

## 2022-09-12 ENCOUNTER — Ambulatory Visit: Payer: 59

## 2022-11-05 ENCOUNTER — Ambulatory Visit (INDEPENDENT_AMBULATORY_CARE_PROVIDER_SITE_OTHER): Payer: 59 | Admitting: Dermatology

## 2022-11-05 ENCOUNTER — Encounter: Payer: Self-pay | Admitting: Dermatology

## 2022-11-05 DIAGNOSIS — L723 Sebaceous cyst: Secondary | ICD-10-CM | POA: Diagnosis not present

## 2022-11-05 DIAGNOSIS — L089 Local infection of the skin and subcutaneous tissue, unspecified: Secondary | ICD-10-CM

## 2022-11-05 MED ORDER — DOXYCYCLINE HYCLATE 100 MG PO CAPS: 100.0000 mg | ORAL_CAPSULE | Freq: Two times a day (BID) | ORAL | 0 refills | Status: AC

## 2022-11-05 NOTE — Patient Instructions (Signed)
Due to recent changes in healthcare laws, you may see results of your pathology and/or laboratory studies on MyChart before the doctors have had a chance to review them. We understand that in some cases there may be results that are confusing or concerning to you. Please understand that not all results are received at the same time and often the doctors may need to interpret multiple results in order to provide you with the best plan of care or course of treatment. Therefore, we ask that you please give us 2 business days to thoroughly review all your results before contacting the office for clarification. Should we see a critical lab result, you will be contacted sooner.   If You Need Anything After Your Visit  If you have any questions or concerns for your doctor, please call our main line at [phone] If no one answers, please leave a voicemail as directed and we will return your call as soon as possible. Messages left after 4 pm will be answered the following business day.   You may also send us a message via MyChart. We typically respond to MyChart messages within 1-2 business days.  For prescription refills, please ask your pharmacy to contact our office. Our fax number is [phone].  If you have an urgent issue when the clinic is closed that cannot wait until the next business day, you can page your doctor at the number below.    Please note that while we do our best to be available for urgent issues outside of office hours, we are not available 24/7.   If you have an urgent issue and are unable to reach us, you may choose to seek medical care at your doctor's office, retail clinic, urgent care center, or emergency room.  If you have a medical emergency, please immediately call 911 or go to the emergency department. In the event of inclement weather, please call our main line at [phone] for an update on the status of any delays or closures.  Dermatology Medication Tips: Please  keep the boxes that topical medications come in in order to help keep track of the instructions about where and how to use these. Pharmacies typically print the medication instructions only on the boxes and not directly on the medication tubes.   If your medication is too expensive, please contact our office at [phone] or send us a message through MyChart.   We are unable to tell what your co-pay for medications will be in advance as this is different depending on your insurance coverage. However, we may be able to find a substitute medication at lower cost or fill out paperwork to get insurance to cover a needed medication.   If a prior authorization is required to get your medication covered by your insurance company, please allow us 1-2 business days to complete this process.  Drug prices often vary depending on where the prescription is filled and some pharmacies may offer cheaper prices.  The website www.goodrx.com contains coupons for medications through different pharmacies. The prices here do not account for what the cost may be with help from insurance (it may be cheaper with your insurance), but the website can give you the price if you did not use any insurance.  - You can print the associated coupon and take it with your prescription to the pharmacy.  - You may also stop by our office during regular business hours and pick up a GoodRx coupon card.  - If you need your   prescription sent electronically to a different pharmacy, notify our office through [place] MyChart or by phone at [phone]     

## 2022-11-05 NOTE — Progress Notes (Unsigned)
   New Patient Visit  Subjective  Brett Clark is a 67 y.o. male who presents for the following: Cyst (Patient is here for a cyst on the back of neck. Noticed it about two weeks ago. Painful to the touch. Has tried warm compress but did not help at all. No drainage. ).    Objective  Well appearing patient in no apparent distress; mood and affect are within normal limits.  A focused examination was performed including neck. Relevant physical exam findings are noted in the Assessment and Plan.  Neck - Posterior Firm subcutaneous nodule    Neck - Posterior Warm, tender, skin   Assessment & Plan  Inflamed sebaceous cyst Neck - Posterior  EPIDERMAL INCLUSION CYST (? Early Abscess) Exam: Subcutaneous nodule at based of neck  Benign-appearing. Exam most consistent with an inflammed epidermal inclusion cyst. Discussed that a cyst is a benign growth that can grow over time and sometimes get irritated or inflamed. Recommend observation if it is not bothersome. Discussed option of surgical excision to remove it if it is growing, symptomatic, or other changes noted.   Treatment Plan: -Start oral Doxy -Pt to be alert for fever, chills, pus/drainage   Related Medications doxycycline (VIBRAMYCIN) 100 MG capsule Take 1 capsule (100 mg total) by mouth 2 (two) times daily. TAKE ONE TABLET BY MOUTH TWICE DAILY WITH MEAL FOR SEVEN DAYS  Inflammation, skin Neck - Posterior  Intralesional injection - Neck - Posterior Location: neck  Informed Consent: Discussed risks (infection, pain, bleeding, bruising, thinning of the skin, loss of skin pigment, lack of resolution, and recurrence of lesion) and benefits of the procedure, as well as the alternatives. Informed consent was obtained. Preparation: The area was prepared a standard fashion.  Anesthesia: none   Procedure Details: An intralesional injection was performed with Kenalog 10 mg/cc. 0.2cc  cc in total were injected.  Total number  of injections: 1  Plan: The patient was instructed on post-op care. Recommend OTC analgesia as needed for pain.    Return if symptoms worsen or fail to improve.   I, Zigmund Gottron, CMA, am acting as scribe for Ellard Artis, MD.

## 2022-11-06 MED ORDER — TRIAMCINOLONE ACETONIDE 10 MG/ML IJ SUSP: 10.0000 mg | Freq: Once | INTRAMUSCULAR | Status: AC

## 2022-12-03 ENCOUNTER — Telehealth: Payer: Self-pay | Admitting: Dermatology

## 2022-12-03 NOTE — Telephone Encounter (Signed)
Contacted patient in regards to voicemail that was left, he was requesting a follow up due to the cysts he was seen for last appointment, but he did mention that he no longer feels pain but there are still 3 knots that are still present, so he wanted to know if Doctor thought a follow up would be best or if there is something he could do for knots to go away. If so,could we advise what he should do or if he needs to schedule follow up.

## 2022-12-03 NOTE — Telephone Encounter (Signed)
We can inject with ILK or send referral to surgery for excision.  Otherwise feeling the cyts under the skin is normal as long as it's not infected

## 2022-12-03 NOTE — Telephone Encounter (Signed)
Please advise if follow up is necessary. Thank you!

## 2022-12-04 NOTE — Telephone Encounter (Signed)
Message sent to patient

## 2023-01-06 NOTE — Progress Notes (Signed)
CARDIOLOGY CONSULT NOTE       Patient ID: RED HOLIDAY MRN: [id number] DOB/AGE: Nov 28, 1955 67 y.o.  Admit date: (Not on file) Referring Physician: Reade/Smith Primary Physician: Elias Else, MD (Inactive) Primary Cardiologist: Katrinka Blazing retired  Reason for Consultation: CAD   HPI:  67 y.o. previously seen by Dr Katrinka Blazing in 2021 History of CAD with anterior wall MI 06/2014 and stenting of the LAD. 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 Abnl lesion on kidney/liver noted on CT @ OSH [facility]) - no further details available.   Atypical mole 09/06/2003   Upper Lesion (moderate) (widershave)   Atypical mole 09/06/2003   Middle Back Lesion (moderate to severe) (excision)   Atypical mole 11/08/2003   Mid Low Back (moderate to severe) (excision)   Atypical mole 11/08/2003   Right Lower Shoulder (moderate)   Atypical mole 11/08/2003   Mid Upper Back (moderate)   Atypical mole 11/08/2003   Left Mid Back (moderate)   Atypical mole 11/08/2003   Mid Abdomen (moderate) (widershave)   Atypical mole 08/02/2004   Mid Lower Back (moderate)   Atypical mole 08/02/2004   Right Shoulder Sup (moderate) (widershave)   Atypical mole 08/02/2004   Right Shoulder Inf (moderate)   Atypical mole 08/02/2004   Right Scapula (moderate)   Atypical mole 06/22/2007   Left Shoulder (moderate to severe) Nino Glow)   Atypical mole 01/22/2005   Mid Back (slight to moderate) Nino Glow)   Atypical mole 09/28/2013   Right Lower Back (moderate to severe)   Atypical mole 06/04/2021   mod-severe-mid back (WS)   CAD (coronary artery disease)    a. 06/2014 NSTEMI/PCI:  LM nl, LAD 80 (2.5x18 Xience DES), LCX nl, RCA nl, EF 40-45%.   Dyslipidemia    Headache    migraine and tension   Ischemic cardiomyopathy    a. 06/2014 EF 40-45% by LV gram;  b. 06/2014 Echo: EF 50-55%, mild LVH, distal anterior, apical, and inferoapical wall motion abnormalities, DD, mild MR.   Myocardial infarction (HCC) 06/2014   Nodular basal cell carcinoma (BCC) 10/01/2017   Right Outer Forehead (excision)   Right renal mass    SCCA (squamous cell carcinoma) of skin 02/15/2020   Right Forearm-Posterior (well diff) TX CURET CAUTERY 5FU   Tobacco abuse     Family History  Problem Relation Age of Onset   CAD Father    COPD Father     Social History   Socioeconomic History   Marital status: Married    Spouse name: Not on file   Number of children: Not on file   Years of education: Not on file   Highest education level: Not on file  Occupational History   Not on file  Tobacco Use   Smoking status: Former    Packs/day: 1.00    Years: 20.00    Additional pack years: 0.00    Total pack years: 20.00    Types: Cigarettes   Smokeless tobacco: Never   Tobacco comments:    quit smoking years ago  Vaping Use   Vaping Use: Never used  Substance and Sexual  Activity   Alcohol use: Yes    Comment: occasional   Drug use: No   Sexual activity: Not on file  Other Topics Concern   Not on file  Social History Narrative   Not on file   Social Determinants of Health   Financial Resource Strain: Not on file  Food Insecurity: Not on file  Transportation Needs: Not on file  Physical Activity: Not on file  Stress: Not on file  Social Connections: Not on file  Intimate Partner Violence: Not on file    Past Surgical History:  Procedure Laterality Date   BACK SURGERY  years ago   neck   CARDIAC CATHETERIZATION  06/23/2014   DES to mid LAD, single v disease   hydrocele surgery  15 years ago   LEFT HEART CATHETERIZATION WITH CORONARY ANGIOGRAM N/A 06/23/2014   Procedure: LEFT  HEART CATHETERIZATION WITH CORONARY ANGIOGRAM;  Surgeon: Clenton Esper M [place], MD;  Location: [facility] CATH LAB;  Service: Cardiovascular;  Laterality: N/A;   ROBOTIC ASSITED PARTIAL NEPHRECTOMY Right 01/18/2015   Procedure: ROBOTIC ASSITED PARTIAL NEPHRECTOMY, INTRAOPERATIVE ULTRASOUND, AND INDOCYANINE GREEN DYE;  Surgeon: Sebastian Ache, MD;  Location: [facility] ORS;  Service: Urology;  Laterality: Right;      Current Outpatient Medications:    aspirin EC 81 MG tablet, Take 1 tablet (81 mg total) by mouth daily., Disp: , Rfl:    atorvastatin (LIPITOR) 80 MG tablet, TAKE 1 TABLET BY MOUTH ONCE DAILY AT  6  PM, Disp: 90 tablet, Rfl: 3   fenofibrate 160 MG tablet, Take 1 tablet (160 mg total) by mouth daily., Disp: 90 tablet, Rfl: 2   ferrous sulfate 324 MG TBEC, Take 324 mg by mouth daily with breakfast., Disp: , Rfl:    lisinopril-hydrochlorothiazide (ZESTORETIC) 10-12.5 MG tablet, Take 1 tablet by mouth daily., Disp: 90 tablet, Rfl: 2   vitamin B-12 (CYANOCOBALAMIN) 250 MCG tablet, Take 500 mcg by mouth daily., Disp: , Rfl:   Current Facility-Administered Medications:    triamcinolone acetonide (KENALOG) 10 MG/ML injection 10 mg, 10 mg, Intradermal, Once, Langston Reusing N, DO  triamcinolone acetonide  10 mg Intradermal Once     Physical Exam: Blood pressure (!) 140/78, pulse 81, height 6\' 1"  (1.854 m), weight 187 lb (84.8 kg).    Affect appropriate Healthy:  appears stated age HEENT: normal Neck supple with no adenopathy JVP normal no bruits no thyromegaly Lungs clear with no wheezing and good diaphragmatic motion Heart:  S1/S2 no murmur, no rub, gallop or click PMI normal Abdomen: benighn, post right nephrectomy no bruit.  No HSM or HJR Distal pulses intact with no bruits No edema Neuro non-focal Skin warm and dry No muscular weakness   Labs:   Lab Results  Component Value Date   WBC 7.0 01/16/2015   HGB 11.1 (L) 01/20/2015   HCT 34.5 (L) 01/20/2015   MCV 89.0 01/16/2015   PLT 386  01/16/2015   No results for input(s): "NA", "K", "CL", "CO2", "BUN", "CREATININE", "CALCIUM", "PROT", "BILITOT", "ALKPHOS", "ALT", "AST", "GLUCOSE" in the last 168 hours.  Invalid input(s): "LABALBU" Lab Results  Component Value Date   TROPONINI 6.06 (HH) 06/22/2014    Lab Results  Component Value Date   CHOL 145 07/10/2022   CHOL 210 (H) 01/29/2022   CHOL 119 12/12/2020   Lab Results  Component Value Date   HDL 29 (L) 07/10/2022   HDL 25 (L) 01/29/2022   HDL 25 (L) 12/12/2020   Lab Results  Component Value Date  LDLCALC 79 07/10/2022   LDLCALC 138 (H) 01/29/2022   LDLCALC 60 12/12/2020   Lab Results  Component Value Date   TRIG 222 (H) 07/10/2022   TRIG 259 (H) 01/29/2022   TRIG 208 (H) 12/12/2020   Lab Results  Component Value Date   CHOLHDL 5.0 07/10/2022   CHOLHDL 8.4 (H) 01/29/2022   CHOLHDL 4.8 12/12/2020   No results found for: "LDLDIRECT"    Radiology: No results found.  EKG: 01/17/2023 SR rate 81 ICRBBB QT 416 msec    ASSESSMENT AND PLAN:   CAD:  distant stenting of LAD with anterior MI  update TTE for echo 50-55% by TTE November 2015 continue ASA and statin HLD:  continue high dose lipitor and fibrates HTN  DASH diet and zetoretic stable Bifasicular Block:  post anterior MI chronic yearly ECG no syncope or evidence of high grade heart block Now only ICRBBB  TTE  F/U in a year   Signed: Charlton Haws 01/17/2023, 8:20 AM

## 2023-01-16 ENCOUNTER — Other Ambulatory Visit: Payer: Self-pay

## 2023-01-16 MED ORDER — FENOFIBRATE 160 MG PO TABS: 160.0000 mg | ORAL_TABLET | Freq: Every day | ORAL | 2 refills | Status: DC

## 2023-01-17 ENCOUNTER — Encounter: Payer: Self-pay | Admitting: Cardiovascular Disease

## 2023-01-17 ENCOUNTER — Ambulatory Visit: Payer: 59 | Attending: Cardiovascular Disease | Admitting: Cardiovascular Disease

## 2023-01-17 VITALS — BP 140/78 | HR 81 | Ht 73.0 in | Wt 187.0 lb

## 2023-01-17 DIAGNOSIS — I255 Ischemic cardiomyopathy: Secondary | ICD-10-CM

## 2023-01-17 NOTE — Patient Instructions (Signed)
Medication Instructions:  Your physician recommends that you continue on your current medications as directed. Please refer to the Current Medication list given to you today.  *If you need a refill on your cardiac medications before your next appointment, please call your pharmacy*  Lab Work: If you have labs (blood work) drawn today and your tests are completely normal, you will receive your results only by: MyChart Message (if you have MyChart) OR A paper copy in the mail If you have any lab test that is abnormal or we need to change your treatment, we will call you to review the results.  Testing/Procedures: Your physician has requested that you have an echocardiogram. Echocardiography is a painless test that uses sound waves to create images of your heart. It provides your doctor with information about the size and shape of your heart and how well your heart's chambers and valves are working. This procedure takes approximately one hour. There are no restrictions for this procedure. Please do NOT wear cologne, perfume, aftershave, or lotions (deodorant is allowed). Please arrive 15 minutes prior to your appointment time.   Follow-Up: At [facility], you and your health needs are our priority.  As part of our continuing mission to provide you with exceptional heart care, we have created designated Provider Care Teams.  These Care Teams include your primary Cardiologist (physician) and Advanced Practice Providers (APPs -  Physician Assistants and Nurse Practitioners) who all work together to provide you with the care you need, when you need it.  We recommend signing up for the patient portal called "MyChart".  Sign up information is provided on this After Visit Summary.  MyChart is used to connect with patients for Virtual Visits (Telemedicine).  Patients are able to view lab/test results, encounter notes, upcoming appointments, etc.  Non-urgent messages can be sent to your provider as  well.   To learn more about what you can do with MyChart, go to [email or web address].    Your next appointment:   1 year(s)  Provider:   Peter Nishan, MD     

## 2023-02-11 ENCOUNTER — Encounter: Payer: Self-pay | Admitting: Dermatology

## 2023-02-11 ENCOUNTER — Ambulatory Visit (INDEPENDENT_AMBULATORY_CARE_PROVIDER_SITE_OTHER): Payer: 59 | Admitting: Dermatology

## 2023-02-11 VITALS — BP 105/72 | HR 76

## 2023-02-11 DIAGNOSIS — D485 Neoplasm of uncertain behavior of skin: Secondary | ICD-10-CM

## 2023-02-11 DIAGNOSIS — D1801 Hemangioma of skin and subcutaneous tissue: Secondary | ICD-10-CM

## 2023-02-11 DIAGNOSIS — Z1283 Encounter for screening for malignant neoplasm of skin: Secondary | ICD-10-CM

## 2023-02-11 DIAGNOSIS — D2272 Melanocytic nevi of left lower limb, including hip: Secondary | ICD-10-CM | POA: Diagnosis not present

## 2023-02-11 DIAGNOSIS — Z85828 Personal history of other malignant neoplasm of skin: Secondary | ICD-10-CM

## 2023-02-11 DIAGNOSIS — W908XXA Exposure to other nonionizing radiation, initial encounter: Secondary | ICD-10-CM | POA: Diagnosis not present

## 2023-02-11 DIAGNOSIS — L814 Other melanin hyperpigmentation: Secondary | ICD-10-CM

## 2023-02-11 DIAGNOSIS — D235 Other benign neoplasm of skin of trunk: Secondary | ICD-10-CM

## 2023-02-11 DIAGNOSIS — D229 Melanocytic nevi, unspecified: Secondary | ICD-10-CM

## 2023-02-11 DIAGNOSIS — L821 Other seborrheic keratosis: Secondary | ICD-10-CM

## 2023-02-11 DIAGNOSIS — L570 Actinic keratosis: Secondary | ICD-10-CM

## 2023-02-11 DIAGNOSIS — D239 Other benign neoplasm of skin, unspecified: Secondary | ICD-10-CM

## 2023-02-11 DIAGNOSIS — L578 Other skin changes due to chronic exposure to nonionizing radiation: Secondary | ICD-10-CM | POA: Diagnosis not present

## 2023-02-11 DIAGNOSIS — D2372 Other benign neoplasm of skin of left lower limb, including hip: Secondary | ICD-10-CM

## 2023-02-11 HISTORY — DX: Other benign neoplasm of skin, unspecified: D23.9

## 2023-02-11 NOTE — Progress Notes (Signed)
Follow-Up Visit   Subjective  Brett Clark is a 67 y.o. male who presents for the following: Skin Cancer Screening and Full Body Skin Exam. Pt has hx of AN as well as a BCC in 2019 on the forehead and SCC on forearm in 2021.no family hx of skin cancer.  Pt has a spot on the right side of his mouth for 6months -1 year that he nicks when shaving and it goes away and comes back. Pt has spots on bilateral temples around a year that get very crusty. At previous visit pt had a cyst on the back of his neck which was inflamed and he took doxycycline which resolved.  The patient presents for Total-Body Skin Exam (TBSE) for skin cancer screening and mole check. The patient has spots, moles and lesions to be evaluated, some may be new or changing and the patient has concerns that these could be cancer.    The following portions of the chart were reviewed this encounter and updated as appropriate: medications, allergies, medical history  Review of Systems:  No other skin or systemic complaints except as noted in HPI or Assessment and Plan.  Objective   Scalp, ears cheeks--several scaly papules  Well appearing patient in no apparent distress; mood and affect are within normal limits.  A full examination was performed including scalp, head, eyes, ears, nose, lips, neck, chest, axillae, abdomen, back, buttocks, bilateral upper extremities, bilateral lower extremities, hands, feet, fingers, toes, fingernails, and toenails. All findings within normal limits unless otherwise noted below.    Scalp, Ears, and Cheeks: Numerous pink scaly papules diagnosed as actinic keratoses.   - Neck and Trunk: Presence of brown waxy papules identified as seborrheic keratoses and red papules identified as blood vessels. Multiple brown macules consistent with lentigenes observed.   - Lip: Presence of a recurrent hyperkeratotic lesion, identified as hyperkeratotic seborrheic keratosis.   - Lateral Neck and Temple Area:  Noted spots, further details not specified.   - Legs: Presence of a dermatofibroma on the right thigh and two moles on the left posterior thigh and left posterior calf, described as dark brown and irregular, suspected to be dysplastic nevi.   - Back: Numerous seborrheic keratoses and an 8mm irregular brown macule on the right posterior shoulder.  Relevant physical exam findings are noted in the Assessment and Plan.  face,neck (10)   Left Posterior calf 4mm irregular brown/black macuel       Left Posterior Thigh 3.5 mm dark brown irregular macule       Right Upper Back 8mm irregular brown macule         Assessment & Plan   SKIN CANCER SCREENING PERFORMED TODAY.  ACTINIC DAMAGE - Chronic condition, secondary to cumulative UV/sun exposure - diffuse scaly erythematous macules with underlying dyspigmentation - Recommend daily broad spectrum sunscreen SPF 30+ to sun-exposed areas, reapply every 2 hours as needed.  - Staying in the shade or wearing long sleeves, sun glasses (UVA+UVB protection) and wide brim hats (4-inch brim around the entire circumference of the hat) are also recommended for sun protection.  - Call for new or changing lesions.  LENTIGINES, SEBORRHEIC KERATOSES, HEMANGIOMAS - Benign normal skin lesions - Benign-appearing - Call for any changes  MELANOCYTIC NEVI - Tan-brown and/or pink-flesh-colored symmetric macules and papules - Benign appearing on exam today - Observation - Call clinic for new or changing moles - Recommend daily use of broad spectrum spf 30+ sunscreen to sun-exposed areas.   Dermatofibroma left  lower leg   PROCEDURE NOTE  AK (actinic keratosis) (10) face,neck  Destruction of lesion - face,neck Complexity: simple   Destruction method: cryotherapy   Informed consent: discussed and consent obtained   Timeout:  patient name, date of birth, surgical site, and procedure verified Lesion destroyed using liquid nitrogen: Yes    Region frozen until ice ball extended beyond lesion: Yes   Outcome: patient tolerated procedure well with no complications   Post-procedure details: wound care instructions given    Neoplasm of uncertain behavior of skin (3) Left Posterior calf  Skin / nail biopsy Type of biopsy: tangential   Informed consent: discussed and consent obtained   Timeout: patient name, date of birth, surgical site, and procedure verified   Procedure prep:  Patient was prepped and draped in usual sterile fashion Prep type:  Isopropyl alcohol Anesthesia: the lesion was anesthetized in a standard fashion   Anesthetic:  1% lidocaine w/ epinephrine 1-100,000 buffered w/ 8.4% NaHCO3 Instrument used: DermaBlade   Hemostasis achieved with: aluminum chloride   Outcome: patient tolerated procedure well   Post-procedure details: sterile dressing applied and wound care instructions given   Dressing type: petrolatum gauze and bandage    Left Posterior Thigh  Skin / nail biopsy Type of biopsy: tangential   Informed consent: discussed and consent obtained   Timeout: patient name, date of birth, surgical site, and procedure verified   Procedure prep:  Patient was prepped and draped in usual sterile fashion Prep type:  Isopropyl alcohol Anesthesia: the lesion was anesthetized in a standard fashion   Anesthetic:  1% lidocaine w/ epinephrine 1-100,000 buffered w/ 8.4% NaHCO3 Instrument used: DermaBlade   Hemostasis achieved with: aluminum chloride   Outcome: patient tolerated procedure well   Post-procedure details: sterile dressing applied and wound care instructions given   Dressing type: petrolatum gauze and bandage    Right Upper Back  Skin / nail biopsy   Return in about 1 year (around 02/11/2024) for TBSE.  Owens Shark, CMA, am acting as scribe for [employer], DO.   Documentation: I have reviewed the above documentation for accuracy and completeness, and I agree with the above.  Langston Reusing, DO

## 2023-02-11 NOTE — Patient Instructions (Addendum)
Thank you for visiting my office today. I appreciate your commitment to monitoring your skin health, especially given your history of skin cancers. Here is a summary of our consultation and the steps we are taking to ensure your continued health:  - Skin Examination and Treatment:   - We identified and treated several actinic keratoses on your scalp, ears, and cheeks using liquid nitrogen to prevent progression to squamous cell carcinoma.   - A hyperkeratotic seborrheic keratosis on your lip was also treated with liquid nitrogen.   - Noted seborrheic keratoses and benign vascular papules on your neck and trunk.   - Dermatofibroma on your right thigh was examined; no treatment needed as it is benign.  - Biopsies:   - Conducted shave biopsies on two moles for examination:     - Left posterior thigh (4mm irregular brown-black macule).     - Left posterior calf (3.92mm dark brown irregular macule).   - An additional biopsy was taken from an 8mm irregular brown macule on your right posterior shoulder.   - These samples have been sent for histological analysis to rule out dysplastic nevi.  We will contact you via MyChart or phone call within the next 2 weeks when we receive the results  - Post-Procedure Care:   -  See post care instructions below  - Follow-Up:   - Results from the biopsies will be communicated through MyChart. If any findings are suspicious, we will contact you directly for further management.  Thank you once again for your proactive approach to your health. Please feel free to reach out if you have any questions or concerns in the meantime.    Patient Handout: Wound Care for Skin Biopsy Site  Patient Handout: Wound Care for Skin Biopsy Site  Taking Care of Your Skin Biopsy Site  Proper care of the biopsy site is essential for promoting healing and minimizing scarring. This handout provides instructions on how to care for your biopsy site to ensure optimal  recovery.  1. Cleaning the Wound:  Clean the biopsy site daily with gentle soap and water. Gently pat the area dry with a clean, soft towel. Avoid harsh scrubbing or rubbing the area, as this can irritate the skin and delay healing.  2. Applying Aquaphor and Bandage:  After cleaning the wound, apply a thin layer of Aquaphor ointment to the biopsy site. Cover the area with a sterile bandage to protect it from dirt, bacteria, and friction. Change the bandage daily or as needed if it becomes soiled or wet.  3. Continued Care for One Week:  Repeat the cleaning, Aquaphor application, and bandaging process daily for one week following the biopsy procedure. Keeping the wound clean and moist during this initial healing period will help prevent infection and promote optimal healing.  4. Massaging Aquaphor into the Area:  ---After one week, discontinue the use of bandages but continue to apply Aquaphor to the biopsy site. ----Gently massage the Aquaphor into the area using circular motions. ---Massaging the skin helps to promote circulation and prevent the formation of scar tissue.   Additional Tips:  Avoid exposing the biopsy site to direct sunlight during the healing process, as this can cause hyperpigmentation or worsen scarring. If you experience any signs of infection, such as increased redness, swelling, warmth, or drainage from the wound, contact your healthcare provider immediately. Follow any additional instructions provided by your healthcare provider for caring for the biopsy site and managing any discomfort. Conclusion:  Taking  proper care of your skin biopsy site is crucial for ensuring optimal healing and minimizing scarring. By following these instructions for cleaning, applying Aquaphor, and massaging the area, you can promote a smooth and successful recovery. If you have any questions or concerns about caring for your biopsy site, don't hesitate to contact your healthcare  provider for guidance.     Cryotherapy Aftercare  wash gently with soap and water everyday.   Apply Vaseline and Band-Aid daily until healed.  Due to recent changes in healthcare laws, you may see results of your pathology and/or laboratory studies on MyChart before the doctors have had a chance to review them. We understand that in some cases there may be results that are confusing or concerning to you. Please understand that not all results are received at the same time and often the doctors may need to interpret multiple results in order to provide you with the best plan of care or course of treatment. Therefore, we ask that you please give [place] 2 business days to thoroughly review all your results before contacting the office for clarification. Should we see a critical lab result, you will be contacted sooner.   If You Need Anything After Your Visit  If you have any questions or concerns for your doctor, please call our main line at [phone] If no one answers, please leave a voicemail as directed and we will return your call as soon as possible. Messages left after 4 pm will be answered the following business day.   You may also send [place] a message via MyChart. We typically respond to MyChart messages within 1-2 business days.  For prescription refills, please ask your pharmacy to contact our office. Our fax number is [phone].  If you have an urgent issue when the clinic is closed that cannot wait until the next business day, you can page your doctor at the number below.    Please note that while we do our best to be available for urgent issues outside of office hours, we are not available 24/7.   If you have an urgent issue and are unable to reach [place], you may choose to seek medical care at your doctor's office, retail clinic, urgent care center, or emergency room.  If you have a medical emergency, please immediately call 911 or go to the emergency department. In the event of  inclement weather, please call our main line at [phone] for an update on the status of any delays or closures.  Dermatology Medication Tips: Please keep the boxes that topical medications come in in order to help keep track of the instructions about where and how to use these. Pharmacies typically print the medication instructions only on the boxes and not directly on the medication tubes.   If your medication is too expensive, please contact our office at [phone] or send [place] a message through MyChart.   We are unable to tell what your co-pay for medications will be in advance as this is different depending on your insurance coverage. However, we may be able to find a substitute medication at lower cost or fill out paperwork to get insurance to cover a needed medication.   If a prior authorization is required to get your medication covered by your insurance company, please allow [place] 1-2 business days to complete this process.  Drug prices often vary depending on where the prescription is filled and some pharmacies may offer cheaper prices.  The website www.goodrx.com contains coupons for medications through  different pharmacies. The prices here do not account for what the cost may be with help from insurance (it may be cheaper with your insurance), but the website can give you the price if you did not use any insurance.  - You can print the associated coupon and take it with your prescription to the pharmacy.  - You may also stop by our office during regular business hours and pick up a GoodRx coupon card.  - If you need your prescription sent electronically to a different pharmacy, notify our office through [facility] or by phone at [phone]

## 2023-02-17 ENCOUNTER — Telehealth: Payer: Self-pay

## 2023-02-17 NOTE — Telephone Encounter (Signed)
-----   Message from Langston Reusing sent at 02/17/2023 10:21 AM EDT ----- Please call pt and notify that their bx results showed an abnormal moles that requires a full excision in office with Dr Onalee Hua  Please schedule a surgery in a 12:30pm slot.   We will do one full excision and 2 shave excisions so we cna do all 3 the same day.  Diagnosis 1. Skin , left posterior thigh JUNCTIONAL DYSPLASTIC MELANOCYTIC NEVUS WITH MODERATE TO SEVERE ATYPIA, INFLAMED, CLOSE TO MARGIN, SEE DESCRIPTION --> shave exicison 2. Skin , left posterior calf JUNCTIONAL DYSPLASTIC MELANOCYTIC NEVUS WITH MODERATE TO SEVERE ATYPIA, CLOSE TO MARGIN, SEE DESCRIPTION --> shave excision 3. Skin , right posterior shoulder DYSPLASTIC JUNCTIONAL NEVUS WITH SEVERE ATYPIA, CLOSE TO MARGIN, SEE DESCRIPTION  --> full excision

## 2023-02-17 NOTE — Telephone Encounter (Signed)
Called and l/m for pt to call back for bx results

## 2023-02-17 NOTE — Telephone Encounter (Signed)
 Advised patient of results and scheduled surgery appointment/hd 

## 2023-02-17 NOTE — Progress Notes (Signed)
Please call pt and notify that their bx results showed an abnormal moles that requires a full excision in office with Dr Onalee Hua  Please schedule a surgery in a 12:30pm slot.   We will do one full excision and 2 shave excisions so we cna do all 3 the same day.  Diagnosis 1. Skin , left posterior thigh JUNCTIONAL DYSPLASTIC MELANOCYTIC NEVUS WITH MODERATE TO SEVERE ATYPIA, INFLAMED, CLOSE TO MARGIN, SEE DESCRIPTION --> shave exicison 2. Skin , left posterior calf JUNCTIONAL DYSPLASTIC MELANOCYTIC NEVUS WITH MODERATE TO SEVERE ATYPIA, CLOSE TO MARGIN, SEE DESCRIPTION --> shave excision 3. Skin , right posterior shoulder DYSPLASTIC JUNCTIONAL NEVUS WITH SEVERE ATYPIA, CLOSE TO MARGIN, SEE DESCRIPTION  --> full excision

## 2023-02-24 ENCOUNTER — Other Ambulatory Visit: Payer: Self-pay

## 2023-02-24 MED ORDER — LISINOPRIL-HYDROCHLOROTHIAZIDE 10-12.5 MG PO TABS: 1.0000 | ORAL_TABLET | Freq: Every day | ORAL | 3 refills | Status: DC

## 2023-02-24 MED ORDER — ATORVASTATIN CALCIUM 80 MG PO TABS: ORAL_TABLET | ORAL | 3 refills | Status: DC

## 2023-03-03 ENCOUNTER — Other Ambulatory Visit: Payer: Self-pay | Admitting: Dermatology

## 2023-03-03 ENCOUNTER — Ambulatory Visit: Payer: 59

## 2023-03-03 ENCOUNTER — Ambulatory Visit (HOSPITAL_COMMUNITY): Payer: 59 | Attending: Cardiology

## 2023-03-03 DIAGNOSIS — I255 Ischemic cardiomyopathy: Secondary | ICD-10-CM | POA: Insufficient documentation

## 2023-03-03 LAB — ECHOCARDIOGRAM COMPLETE
Area-P 1/2: 2.85 cm2
S' Lateral: 3 cm

## 2023-03-04 ENCOUNTER — Encounter: Payer: Self-pay | Admitting: Dermatology

## 2023-04-15 ENCOUNTER — Encounter: Payer: Self-pay | Admitting: Dermatology

## 2023-04-15 ENCOUNTER — Ambulatory Visit (INDEPENDENT_AMBULATORY_CARE_PROVIDER_SITE_OTHER): Payer: 59 | Admitting: Dermatology

## 2023-04-15 DIAGNOSIS — L304 Erythema intertrigo: Secondary | ICD-10-CM | POA: Diagnosis not present

## 2023-04-15 DIAGNOSIS — D2372 Other benign neoplasm of skin of left lower limb, including hip: Secondary | ICD-10-CM

## 2023-04-15 DIAGNOSIS — D239 Other benign neoplasm of skin, unspecified: Secondary | ICD-10-CM

## 2023-04-15 MED ORDER — NYSTATIN-TRIAMCINOLONE 100000-0.1 UNIT/GM-% EX OINT
1.0000 | TOPICAL_OINTMENT | Freq: Two times a day (BID) | CUTANEOUS | 1 refills | Status: DC
Start: 2023-04-15 — End: 2024-03-10

## 2023-04-15 MED ORDER — MUPIROCIN 2 % EX OINT
1.0000 | TOPICAL_OINTMENT | Freq: Two times a day (BID) | CUTANEOUS | 1 refills | Status: DC
Start: 2023-04-15 — End: 2024-03-10

## 2023-04-15 NOTE — Progress Notes (Signed)
   Follow-Up Visit   Subjective  Brett Clark is a 67 y.o. male who presents for the following: Shave Excision of Left Thigh and Left Calf  The following portions of the chart were reviewed this encounter and updated as appropriate: medications, allergies, medical history  Review of Systems:  No other skin or systemic complaints except as noted in HPI or Assessment and Plan.  Objective  Well appearing patient in no apparent distress; mood and affect are within normal limits.  A focused examination was performed of the following areas: Left Thigh and Left Calf  Relevant physical exam findings are noted in the Assessment and Plan.   Left Lower Leg - Posterior, Left Thigh - Anterior A: 5 mm with 4 mm Margins B: 4 mm with 4 mm Margins         Assessment & Plan   INTERTRIGO Exam: Erythematous macerated patch on scrotum  Flared  Intertrigo is a chronic recurrent rash that occurs in skin fold areas that may be associated with friction; heat; moisture; yeast; fungus; and bacteria.  It is exacerbated by increased movement / activity; sweating; and higher atmospheric temperature.  Treatment Plan - We will prescribe Nystatin-TMC ointment to apply daily to effected areas until clear   Assessment & Plan   Dysplastic nevus (2) Left Thigh - Anterior; Left Lower Leg - Posterior  Epidermal / dermal shaving - Left Lower Leg - Posterior, Left Thigh - Anterior  Lesion diameter (cm):  0.8 Informed consent: discussed and consent obtained   Timeout: patient name, date of birth, surgical site, and procedure verified   Procedure prep:  Patient was prepped and draped in usual sterile fashion Prep type:  Isopropyl alcohol Anesthesia: the lesion was anesthetized in a standard fashion   Anesthetic:  1% lidocaine w/ epinephrine 1-100,000 buffered w/ 8.4% NaHCO3 Instrument used: DermaBlade   Hemostasis achieved with: aluminum chloride   Outcome: patient tolerated procedure well    Post-procedure details: sterile dressing applied and wound care instructions given   Dressing type: petrolatum   Additional details:  Pt aware that benign results will be sent to mychart and the staff will call abnormal results will  Specimen 1 - Surgical pathology Differential Diagnosis: Severely Dysplastic Nevus  Check Margins: No  Specimen 2 - Surgical pathology Differential Diagnosis: Severely Dysplastic Nevus  Check Margins: No  Related Medications mupirocin ointment (BACTROBAN) 2 % Apply 1 Application topically 2 (two) times daily.  Erythema intertrigo  Related Medications nystatin-triamcinolone ointment (MYCOLOG) Apply 1 Application topically 2 (two) times daily.  INTERTRIGO Exam: Erythematous macerated patches  Flared  Intertrigo is a chronic recurrent rash that occurs in skin fold areas that may be associated with friction; heat; moisture; yeast; fungus; and bacteria.  It is exacerbated by increased movement / activity; sweating; and higher atmospheric temperature.  Treatment Plan - We will prescribe Nystatin-TMC ointment to apply daily to effected areas until clear   No follow-ups on file.   Documentation: I have reviewed the above documentation for accuracy and completeness, and I agree with the above.  Stasia Cavalier, am acting as scribe for Langston Reusing, DO.  Langston Reusing, DO

## 2023-04-15 NOTE — Patient Instructions (Signed)

## 2023-04-17 LAB — SURGICAL PATHOLOGY

## 2023-04-21 NOTE — Progress Notes (Signed)
Surgical excision pathology report was reviewed and showed clear margins.  No additional treatment required.

## 2023-06-02 ENCOUNTER — Ambulatory Visit: Payer: 59 | Admitting: Dermatology

## 2023-06-02 ENCOUNTER — Encounter: Payer: Self-pay | Admitting: Dermatology

## 2023-06-02 DIAGNOSIS — D2361 Other benign neoplasm of skin of right upper limb, including shoulder: Secondary | ICD-10-CM | POA: Diagnosis not present

## 2023-06-02 DIAGNOSIS — D225 Melanocytic nevi of trunk: Secondary | ICD-10-CM

## 2023-06-02 DIAGNOSIS — D485 Neoplasm of uncertain behavior of skin: Secondary | ICD-10-CM

## 2023-06-02 DIAGNOSIS — D239 Other benign neoplasm of skin, unspecified: Secondary | ICD-10-CM

## 2023-06-02 DIAGNOSIS — D492 Neoplasm of unspecified behavior of bone, soft tissue, and skin: Secondary | ICD-10-CM

## 2023-06-02 MED ORDER — DOXYCYCLINE HYCLATE 100 MG PO TABS: 100.0000 mg | ORAL_TABLET | Freq: Two times a day (BID) | ORAL | 0 refills | Status: AC

## 2023-06-02 NOTE — Progress Notes (Signed)
Follow-Up Visit   Subjective  Brett Clark is a 67 y.o. male who presents for the following: Excision of dysplastic nevus of right post shoulder  The following portions of the chart were reviewed this encounter and updated as appropriate: medications, allergies, medical history  Review of Systems:  No other skin or systemic complaints except as noted in HPI or Assessment and Plan.  Objective  Well appearing patient in no apparent distress; mood and affect are within normal limits.  A focused examination was performed of the following areas: back   Relevant physical exam findings are noted in the Assessment and Plan.   Right Shoulder - Posterior Healing biopsy site  Mid Back Irregular brown black macule 0.5 cm    Assessment & Plan   Dysplastic nevus Right Shoulder - Posterior  Skin excision - Right Shoulder - Posterior  Lesion length (cm):  0.6 Lesion width (cm):  0.6 Margin per side (cm):  0.5 Total excision diameter (cm):  1.6 Informed consent: discussed and consent obtained   Timeout: patient name, date of birth, surgical site, and procedure verified   Procedure prep:  Patient was prepped and draped in usual sterile fashion Prep type:  Isopropyl alcohol and povidone-iodine Anesthesia: the lesion was anesthetized in a standard fashion   Anesthetic:  1% lidocaine w/ epinephrine 1-100,000 buffered w/ 8.4% NaHCO3 Instrument used: #15 blade   Hemostasis achieved with: pressure   Hemostasis achieved with comment:  Electrocautery Outcome: patient tolerated procedure well with no complications   Post-procedure details: sterile dressing applied and wound care instructions given   Dressing type: bandage and pressure dressing (mupirocin)    Skin repair - Right Shoulder - Posterior Complexity:  Complex Reason for type of repair: reduce tension to allow closure, reduce the risk of dehiscence, infection, and necrosis, reduce subcutaneous dead space and avoid a hematoma,  allow closure of the large defect, preserve normal anatomy, preserve normal anatomical and functional relationships and enhance both functionality and cosmetic results   Undermining: area extensively undermined   Undermining comment:  Undermining defect  Subcutaneous layers (deep stitches):  Suture type: Vicryl (polyglactin 910)   Subcutaneous suture technique: inverted dermal. Fine/surface layer approximation (top stitches):  Suture type: nylon   Stitches: simple running   Suture removal (days):  7 Hemostasis achieved with: suture and pressure Outcome: patient tolerated procedure well with no complications   Post-procedure details: sterile dressing applied and wound care instructions given   Dressing type: bandage and pressure dressing (mupirocin)    doxycycline (VIBRA-TABS) 100 MG tablet - Right Shoulder - Posterior Take 1 tablet (100 mg total) by mouth 2 (two) times daily for 5 days. With food and plenty of fluid  Specimen 1 - Surgical pathology Differential Diagnosis: Biopsy proven dysplastic nevus  Check Margins: Yes [id number]  Related Medications mupirocin ointment (BACTROBAN) 2 % Apply 1 Application topically 2 (two) times daily.  Neoplasm of uncertain behavior of skin Mid Back  Skin / nail biopsy Type of biopsy: tangential   Informed consent: discussed and consent obtained   Timeout: patient name, date of birth, surgical site, and procedure verified   Procedure prep:  Patient was prepped and draped in usual sterile fashion Prep type:  Isopropyl alcohol Anesthesia: the lesion was anesthetized in a standard fashion   Anesthetic:  1% lidocaine w/ epinephrine 1-100,000 buffered w/ 8.4% NaHCO3 Instrument used: flexible razor blade   Hemostasis achieved with: pressure, aluminum chloride and electrodesiccation   Outcome: patient tolerated procedure well  Post-procedure details: sterile dressing applied and wound care instructions given   Dressing type: bandage and  petrolatum    Specimen 2 - Surgical pathology Differential Diagnosis: R/O Dysplastic nevus vs Melanoma  Check Margins: No     Return in about 2 weeks (around 06/16/2023) for suture removal.  I, Joanie Coddington, CMA, am acting as scribe for [employer], DO .   Documentation: I have reviewed the above documentation for accuracy and completeness, and I agree with the above.  Langston Reusing, DO

## 2023-06-02 NOTE — Patient Instructions (Signed)
Wound Care Instructions  On the day following your surgery, you should begin doing daily dressing changes: Remove the old dressing and discard it. Cleanse the wound gently with tap water. This may be done in the shower or by placing a wet gauze pad directly on the wound and letting it soak for several minutes. It is important to gently remove any dried blood from the wound in order to encourage healing. This may be done by gently rolling a moistened Q-tip on the dried blood. Do not pick at the wound. If the wound should start to bleed, continue cleaning the wound, then place a moist gauze pad on the wound and hold pressure for a few minutes.  Make sure you then dry the skin surrounding the wound completely or the tape will not stick to the skin. Do not use cotton balls on the wound. After the wound is clean and dry, apply the ointment gently with a Q-tip. Cut a non-stick pad to fit the size of the wound. Lay the pad flush to the wound. If the wound is draining, you may want to reinforce it with a small amount of gauze on top of the non-stick pad for a little added compression to the area. Use the tape to seal the area completely. Select from the following with respect to your individual situation: If your wound has been stitched closed: continue the above steps 1-8 at least daily until your sutures are removed. If your wound has been left open to heal: continue steps 1-8 at least daily for the first 3-4 weeks. We would like for you to take a few extra precautions for at least the next week. Sleep with your head elevated on pillows if our wound is on your head. Do not bend over or lift heavy items to reduce the chance of elevated blood pressure to the wound Do not participate in particularly strenuous activities.   Below is a list of dressing supplies you might need.  Cotton-tipped applicators - Q-tips Gauze pads (2x2 and/or 4x4) - All-Purpose Sponges Non-stick dressing material - Telfa Tape -  Paper or Hypafix New and clean tube of petroleum jelly - Vaseline    Comments on Post-Operative Period Slight swelling and redness often appear around the wound. This is normal and will disappear within several days following the surgery. The healing wound will drain a brownish-red-yellow discharge during healing. This is a normal phase of wound healing. As the wound begins to heal, the drainage may increase in amount. Again, this drainage is normal. Notify us if the drainage becomes persistently bloody, excessively swollen, or intensely painful or develops a foul odor or red streaks.  If you should experience mild discomfort during the healing phase, you may take an aspirin-free medication such as Tylenol (acetaminophen). Notify us if the discomfort is severe or persistent. Avoid alcoholic beverages when taking pain medicine.  In Case of Wound Hemorrhage A wound hemorrhage is when the bandage suddenly becomes soaked with bright red blood and flows profusely. If this happens, sit down or lie down with your head elevated. If the wound has a dressing on it, do not remove the dressing. Apply pressure to the existing gauze. If the wound is not covered, use a gauze pad to apply pressure and continue applying the pressure for 20 minutes without peeking. DO NOT COVER THE WOUND WITH A LARGE TOWEL OR WASH CLOTH. Release your hand from the wound site but do not remove the dressing. If the bleeding has stopped,  gently clean around the wound. Leave the dressing in place for 24 hours if possible. This wait time allows the blood vessels to close off so that you do not spark a new round of bleeding by disrupting the newly clotted blood vessels with an immediate dressing change. If the bleeding does not subside, continue to hold pressure. If matters are out of your control, contact an After Hours clinic or go to the Emergency Room.   Important Information  Due to recent changes in healthcare laws, you may see  results of your pathology and/or laboratory studies on MyChart before the doctors have had a chance to review them. We understand that in some cases there may be results that are confusing or concerning to you. Please understand that not all results are received at the same time and often the doctors may need to interpret multiple results in order to provide you with the best plan of care or course of treatment. Therefore, we ask that you please give [place] 2 business days to thoroughly review all your results before contacting the office for clarification. Should we see a critical lab result, you will be contacted sooner.   If You Need Anything After Your Visit  If you have any questions or concerns for your doctor, please call our main line at [phone] If no one answers, please leave a voicemail as directed and we will return your call as soon as possible. Messages left after 4 pm will be answered the following business day.   You may also send [place] a message via MyChart. We typically respond to MyChart messages within 1-2 business days.  For prescription refills, please ask your pharmacy to contact our office. Our fax number is [phone].  If you have an urgent issue when the clinic is closed that cannot wait until the next business day, you can page your doctor at the number below.    Please note that while we do our best to be available for urgent issues outside of office hours, we are not available 24/7.   If you have an urgent issue and are unable to reach [place], you may choose to seek medical care at your doctor's office, retail clinic, urgent care center, or emergency room.  If you have a medical emergency, please immediately call 911 or go to the emergency department. In the event of inclement weather, please call our main line at [phone] for an update on the status of any delays or closures.  Dermatology Medication Tips: Please keep the boxes that topical medications come in in  order to help keep track of the instructions about where and how to use these. Pharmacies typically print the medication instructions only on the boxes and not directly on the medication tubes.   If your medication is too expensive, please contact our office at [phone] or send [place] a message through MyChart.   We are unable to tell what your co-pay for medications will be in advance as this is different depending on your insurance coverage. However, we may be able to find a substitute medication at lower cost or fill out paperwork to get insurance to cover a needed medication.   If a prior authorization is required to get your medication covered by your insurance company, please allow [place] 1-2 business days to complete this process.  Drug prices often vary depending on where the prescription is filled and some pharmacies may offer cheaper prices.  The website www.goodrx.com contains coupons for medications through different  pharmacies. The prices here do not account for what the cost may be with help from insurance (it may be cheaper with your insurance), but the website can give you the price if you did not use any insurance.  - You can print the associated coupon and take it with your prescription to the pharmacy.  - You may also stop by our office during regular business hours and pick up a GoodRx coupon card.  - If you need your prescription sent electronically to a different pharmacy, notify our office through [facility] or by phone at [phone]

## 2023-06-05 LAB — SURGICAL PATHOLOGY

## 2023-06-09 NOTE — Progress Notes (Signed)
Surgical excision and shave bx  pathology report and were reviewed and showed clear margins.  No additional treatment required.

## 2023-06-12 ENCOUNTER — Ambulatory Visit (INDEPENDENT_AMBULATORY_CARE_PROVIDER_SITE_OTHER): Payer: 59 | Admitting: Dermatology

## 2023-06-12 DIAGNOSIS — Z4802 Encounter for removal of sutures: Secondary | ICD-10-CM

## 2023-06-12 NOTE — Progress Notes (Signed)
   Follow-Up Visit   Subjective  Brett Clark is a 67 y.o. male who presents for the following: suture removal  Patient present today for follow up visit for suture removal . Patient was last evaluated on 06/02/23 for Excision done on right posterior shoulder.    Assessment & Plan   Encounter for Removal of Sutures - Incision site at the back is clean, dry and intact - Wound cleansed, sutures removed, wound cleansed and steri strips applied.  - Discussed pathology results showing margins were clear  - Patient advised to keep steri-strips dry until they fall off. - Scars remodel for a full year. - Once steri-strips fall off, patient can apply over-the-counter silicone scar cream each night to help with scar remodeling if desired. - Patient advised to call with any concerns or if they notice any new or changing lesions.     No follow-ups on file.    Documentation: I have reviewed the above documentation for accuracy and completeness, and I agree with the above.   I, Shirron Marcha Solders, CMA, am acting as scribe for [employer], DO.   Langston Reusing, DO

## 2023-06-16 ENCOUNTER — Encounter: Payer: 59 | Admitting: Dermatology

## 2023-11-16 ENCOUNTER — Other Ambulatory Visit: Payer: Self-pay | Admitting: Cardiovascular Disease

## 2023-12-10 ENCOUNTER — Ambulatory Visit: Admitting: Dermatology

## 2023-12-10 ENCOUNTER — Other Ambulatory Visit: Payer: Self-pay | Admitting: Dermatology

## 2023-12-10 ENCOUNTER — Encounter: Payer: Self-pay | Admitting: Dermatology

## 2023-12-10 VITALS — BP 121/78 | HR 87

## 2023-12-10 DIAGNOSIS — L0211 Cutaneous abscess of neck: Secondary | ICD-10-CM

## 2023-12-10 DIAGNOSIS — L0291 Cutaneous abscess, unspecified: Secondary | ICD-10-CM

## 2023-12-10 MED ORDER — DOXYCYCLINE HYCLATE 100 MG PO TABS: 100.0000 mg | ORAL_TABLET | Freq: Two times a day (BID) | ORAL | 0 refills | Status: AC

## 2023-12-10 NOTE — Patient Instructions (Addendum)
 Hello Brett Clark,  Thank you for visiting today. Here is a summary of the key instructions:  - Wound Care:   - Take a shower later today and let warm water  run over the drained area   - The wound may continue to drain for a while, which is normal   - The drainage may be blood-tinged, which is also normal  - Medications:   - Take doxycycline  twice a day with food   - Apply mupirocin  ointment to the wound area  - Follow-up:   - Schedule a surgical excision with Dr. Pasi in 4-6 weeks   - Schedule this appointment at the front desk before leaving  - Procedure:   - A culture was taken and sent for testing   - Results will be available in 1-1.5 weeks  If you have any questions or concerns, please do not hesitate to contact our office.  Warm regards,  Dr. Louana Roup, Dermatology      Important Information   Due to recent changes in healthcare laws, you may see results of your pathology and/or laboratory studies on MyChart before the doctors have had a chance to review them. We understand that in some cases there may be results that are confusing or concerning to you. Please understand that not all results are received at the same time and often the doctors may need to interpret multiple results in order to provide you with the best plan of care or course of treatment. Therefore, we ask that you please give us  2 business days to thoroughly review all your results before contacting the office for clarification. Should we see a critical lab result, you will be contacted sooner.     If You Need Anything After Your Visit   If you have any questions or concerns for your doctor, please call our main line at [phone]. If no one answers, please leave a voicemail as directed and we will return your call as soon as possible. Messages left after 4 pm will be answered the following business day.    You may also send us  a message via MyChart. We typically respond to MyChart messages within 1-2  business days.  For prescription refills, please ask your pharmacy to contact our office. Our fax number is [phone].  If you have an urgent issue when the clinic is closed that cannot wait until the next business day, you can page your doctor at the number below.     Please note that while we do our best to be available for urgent issues outside of office hours, we are not available 24/7.    If you have an urgent issue and are unable to reach us , you may choose to seek medical care at your doctor's office, retail clinic, urgent care center, or emergency room.   If you have a medical emergency, please immediately call 911 or go to the emergency department. In the event of inclement weather, please call our main line at [phone] for an update on the status of any delays or closures.  Dermatology Medication Tips: Please keep the boxes that topical medications come in in order to help keep track of the instructions about where and how to use these. Pharmacies typically print the medication instructions only on the boxes and not directly on the medication tubes.   If your medication is too expensive, please contact our office at [phone] or send us  a message through MyChart.    We are unable to tell what  your co-pay for medications will be in advance as this is different depending on your insurance coverage. However, we may be able to find a substitute medication at lower cost or fill out paperwork to get insurance to cover a needed medication.    If a prior authorization is required to get your medication covered by your insurance company, please allow us  1-2 business days to complete this process.   Drug prices often vary depending on where the prescription is filled and some pharmacies may offer cheaper prices.   The website www.goodrx.com contains coupons for medications through different pharmacies. The prices here do not account for what the cost may be with help from insurance  (it may be cheaper with your insurance), but the website can give you the price if you did not use any insurance.  - You can print the associated coupon and take it with your prescription to the pharmacy.  - You may also stop by our office during regular business hours and pick up a GoodRx coupon card.  - If you need your prescription sent electronically to a different pharmacy, notify our office through [facility] or by phone at [phone]

## 2023-12-10 NOTE — Progress Notes (Signed)
   Follow-Up Visit   Subjective  Brett Clark is a 68 y.o. male who presents for the following: Abscess  Patient present today for follow up visit for abscess. Patient was last evaluated on 06/12/23. He states the area started to become painful about 4 days ago. He rates his discomfort 10 out of 10. He had a ILK injection completed on 11/05/22. Patient reports sxs are worse. Patient denies medication changes.  The following portions of the chart were reviewed this encounter and updated as appropriate: medications, allergies, medical history  Review of Systems:  No other skin or systemic complaints except as noted in HPI or Assessment and Plan.  Objective  Well appearing patient in no apparent distress; mood and affect are within normal limits.  A focused examination was performed of the following areas: Right Neck  Relevant exam findings are noted in the Assessment and Plan.    Assessment & Plan   1. Abscess on Back of Neck - Assessment: Patient presents with a painful, inflamed cyst on the back of the neck causing sleep disturbance for 6 weeks. Cyst is tender to palpation and appears infected or significantly inflamed. Possibility of a second cyst developing adjacent to the primary one.  - Plan:    Perform incision and drainage (I&D) procedure    Apply mupirocin  ointment to the wound post-procedure    Prescribe doxycycline  100 mg PO BID with food for infection    Obtain wound culture    Patient education:     - Shower later today, allowing warm water  to run over the area     - Expect some blood-tinged drainage    Schedule follow-up surgical excision with Dr. Pasi in 4-6 weeks     - Informed consent: Surgical excision necessary to prevent recurrence    Follow up on culture results in 7-10 days ABSCESS Neck - Posterior Incision and Drainage - Neck - Posterior Location: Right Posterior Neck  Informed Consent: Discussed risks (permanent scarring, light or dark discoloration,  infection, pain, bleeding, bruising, redness, damage to adjacent structures, and recurrence of the lesion) and benefits of the procedure, as well as the alternatives.  Informed consent was obtained.  Preparation: The area was prepped with alcohol.  Anesthesia: Lidocaine  1% with epinephrine  Procedure Details: An incision was made overlying the lesion. The lesion drained pus and cyst material. The area was flushed with sterile saline. Ointment and a sterile pressure dressing were applied. The patient tolerated procedure well.  Total number of lesions drained: 1  Plan: The patient was instructed on post-op care. Recommend OTC analgesia as needed for pain.  Related Medications doxycycline  (VIBRA -TABS) 100 MG tablet Take 1 tablet (100 mg total) by mouth 2 (two) times daily. TAKE WITH HEAVY MEAL  Return for ASAP Cyst Excison with Dr. Paci.  I, Jetta Ager, am acting as Neurosurgeon for [employer], DO.  Documentation: I have reviewed the above documentation for accuracy and completeness, and I agree with the above.  Louana Roup, DO

## 2023-12-16 ENCOUNTER — Ambulatory Visit: Payer: Self-pay | Admitting: Dermatology

## 2023-12-16 LAB — AEROBIC CULTURE

## 2023-12-16 LAB — ANAEROBIC CULTURE

## 2023-12-16 NOTE — Progress Notes (Signed)
 Please call pt and let him know the bacterial culture was positive however the doxycycline  he's taking is covered.  He could continue and finish the full course.

## 2023-12-22 ENCOUNTER — Encounter: Payer: Self-pay | Admitting: Dermatology

## 2023-12-24 ENCOUNTER — Encounter: Payer: Self-pay | Admitting: Dermatology

## 2023-12-24 ENCOUNTER — Ambulatory Visit (INDEPENDENT_AMBULATORY_CARE_PROVIDER_SITE_OTHER): Admitting: Dermatology

## 2023-12-24 VITALS — BP 123/75 | HR 71 | Temp 98.8°F

## 2023-12-24 DIAGNOSIS — D485 Neoplasm of uncertain behavior of skin: Secondary | ICD-10-CM

## 2023-12-24 DIAGNOSIS — D492 Neoplasm of unspecified behavior of bone, soft tissue, and skin: Secondary | ICD-10-CM

## 2023-12-24 DIAGNOSIS — L720 Epidermal cyst: Secondary | ICD-10-CM | POA: Diagnosis not present

## 2023-12-24 NOTE — Patient Instructions (Signed)

## 2023-12-24 NOTE — Progress Notes (Signed)
 Follow-Up Visit   Subjective  Brett Clark is a 68 y.o. male who presents for the following: Excision of a subcutaneous nodule of the posterior neck  Patient reports that the cyst has been there for 1.5 years. It was first treated with injection. The injection helped at first but the fluid reaccumilated. The cyst was drained during the last visit with Dr. Myrtie Atkinson on 12/16/2023 and the patient was started on a 7 day course of doxycyline. Patient reports that the area is feeling better today and is ready to proceed with complete removal.  Review of Systems:  No other skin or systemic complaints except as noted in HPI or Assessment and Plan.  Objective  Well appearing patient in no apparent distress; mood and affect are within normal limits.  A focused examination was performed of the following areas: Posterior neck Relevant physical exam findings are noted in the Assessment and Plan.   posterior neck Subcutaneous nodule   Assessment & Plan   NEOPLASM OF UNCERTAIN BEHAVIOR OF SKIN posterior neck Skin excision  Excision method:  elliptical Lesion length (cm):  3.2 Lesion width (cm):  2.3 Margin per side (cm):  0.1 Total excision diameter (cm):  3.4 Informed consent: discussed and consent obtained   Timeout: patient name, date of birth, surgical site, and procedure verified   Procedure prep:  Patient was prepped and draped in usual sterile fashion Prep type:  Chlorhexidine Anesthesia: the lesion was anesthetized in a standard fashion   Anesthetic:  1% lidocaine  w/ epinephrine 1-100,000 buffered w/ 8.4% NaHCO3 Instrument used: #15 blade   Hemostasis achieved with: suture, pressure and electrodesiccation   Outcome: patient tolerated procedure well with no complications   Post-procedure details: sterile dressing applied and wound care instructions given   Dressing type: bandage, pressure dressing and petrolatum    Skin repair Complexity:  Complex Final length (cm):   3.8 Informed consent: discussed and consent obtained   Timeout: patient name, date of birth, surgical site, and procedure verified   Procedure prep:  Patient was prepped and draped in usual sterile fashion Prep type:  Chlorhexidine Anesthesia: the lesion was anesthetized in a standard fashion   Anesthetic:  1% lidocaine  w/ epinephrine 1-100,000 buffered w/ 8.4% NaHCO3 Reason for type of repair: reduce tension to allow closure, preserve normal anatomy, preserve normal anatomical and functional relationships, avoid adjacent structures and allow side-to-side closure without requiring a flap or graft   Undermining: area extensively undermined   Subcutaneous layers (deep stitches):  Suture size:  4-0 Suture type: Vicryl (polyglactin 910)   Stitches:  Buried vertical mattress Fine/surface layer approximation (top stitches):  Suture size:  6-0 Suture type: fast-absorbing plain gut   Stitches: simple running   Hemostasis achieved with: suture, pressure and electrodesiccation Outcome: patient tolerated procedure well with no complications   Post-procedure details: sterile dressing applied and wound care instructions given   Dressing type: bandage, pressure dressing and petrolatum   Specimen 1 - Surgical pathology Differential Diagnosis: R/O cyst vs lipoma vs other  Check Margins: No  The surgical wound was then cleaned, prepped, and re-anesthetized as above. Wound edges were undermined extensively along at least one entire edge and at a distance equal to or greater than the width of the defect (see wound defect size above) in order to achieve closure and decrease wound tension and anatomic distortion. Redundant tissue repair including standing cone removal was performed. Hemostasis was achieved with electrocautery. Subcutaneous and epidermal tissues were approximated with the above sutures.  The surgical site was then lightly scrubbed with sterile, saline-soaked gauze. The area was then bandaged  using Vaseline ointment, non-adherent gauze, gauze pads, and tape to provide an adequate pressure dressing. The patient tolerated the procedure well, was given detailed written and verbal wound care instructions, and was discharged in good condition.   The patient will follow-up: PRN  Return if symptoms worsen or fail to improve.  Documentation: I have reviewed the above documentation for accuracy and completeness, and I agree with the above.  Deneise Finlay, MD

## 2023-12-30 ENCOUNTER — Ambulatory Visit: Payer: Self-pay | Admitting: Dermatology

## 2023-12-30 LAB — SURGICAL PATHOLOGY

## 2024-02-11 ENCOUNTER — Encounter: Payer: Self-pay | Admitting: Dermatology

## 2024-02-11 ENCOUNTER — Ambulatory Visit: Payer: 59 | Admitting: Dermatology

## 2024-02-11 VITALS — BP 138/78 | HR 79

## 2024-02-11 DIAGNOSIS — L814 Other melanin hyperpigmentation: Secondary | ICD-10-CM

## 2024-02-11 DIAGNOSIS — L570 Actinic keratosis: Secondary | ICD-10-CM | POA: Diagnosis not present

## 2024-02-11 DIAGNOSIS — L820 Inflamed seborrheic keratosis: Secondary | ICD-10-CM | POA: Diagnosis not present

## 2024-02-11 DIAGNOSIS — M6740 Ganglion, unspecified site: Secondary | ICD-10-CM

## 2024-02-11 DIAGNOSIS — L821 Other seborrheic keratosis: Secondary | ICD-10-CM

## 2024-02-11 DIAGNOSIS — D2372 Other benign neoplasm of skin of left lower limb, including hip: Secondary | ICD-10-CM

## 2024-02-11 DIAGNOSIS — Z1283 Encounter for screening for malignant neoplasm of skin: Secondary | ICD-10-CM

## 2024-02-11 DIAGNOSIS — D1801 Hemangioma of skin and subcutaneous tissue: Secondary | ICD-10-CM | POA: Diagnosis not present

## 2024-02-11 DIAGNOSIS — Z86018 Personal history of other benign neoplasm: Secondary | ICD-10-CM

## 2024-02-11 DIAGNOSIS — L578 Other skin changes due to chronic exposure to nonionizing radiation: Secondary | ICD-10-CM

## 2024-02-11 DIAGNOSIS — W908XXA Exposure to other nonionizing radiation, initial encounter: Secondary | ICD-10-CM

## 2024-02-11 DIAGNOSIS — D229 Melanocytic nevi, unspecified: Secondary | ICD-10-CM

## 2024-02-11 DIAGNOSIS — Z85828 Personal history of other malignant neoplasm of skin: Secondary | ICD-10-CM

## 2024-02-11 NOTE — Patient Instructions (Addendum)
 For areas treated with Liquid Nitrogen:  Keep clean with soap and water.  Apply Vaseline or Aquaphor twice daily.  Skin Education :   I counseled the patient regarding the following: Sun screen (SPF 30 or greater) should be applied during peak UV exposure (between 10am and 2pm) and reapplied after exercise or swimming.  The ABCDEs of melanoma were reviewed with the patient, and the importance of monthly self-examination of moles was emphasized. Should any moles change in shape or color, or itch, bleed or burn, pt will contact our office for evaluation sooner then their interval appointment.  Plan: Sunscreen Recommendations I recommended a broad spectrum sunscreen with a SPF of 30 or higher. I explained that SPF 30 sunscreens block approximately 97 percent of the sun's harmful rays. Sunscreens should be applied at least 15 minutes prior to expected sun exposure and then every 2 hours after that as long as sun exposure continues. If swimming or exercising sunscreen should be reapplied every 45 minutes to an hour after getting wet or sweating. One ounce, or the equivalent of a shot glass full of sunscreen, is adequate to protect the skin not covered by a bathing suit. I also recommended a lip balm with a sunscreen as well. Sun protective clothing can be used in lieu of sunscreen but must be worn the entire time you are exposed to the sun's rays.  Important Information  Due to recent changes in healthcare laws, you may see results of your pathology and/or laboratory studies on MyChart before the doctors have had a chance to review them. We understand that in some cases there may be results that are confusing or concerning to you. Please understand that not all results are received at the same time and often the doctors may need to interpret multiple results in order to provide you with the best plan of care or course of treatment. Therefore, we ask that you please give us  2 business days to thoroughly  review all your results before contacting the office for clarification. Should we see a critical lab result, you will be contacted sooner.   If You Need Anything After Your Visit  If you have any questions or concerns for your doctor, please call our main line at [phone] If no one answers, please leave a voicemail as directed and we will return your call as soon as possible. Messages left after 4 pm will be answered the following business day.   You may also send us  a message via MyChart. We typically respond to MyChart messages within 1-2 business days.  For prescription refills, please ask your pharmacy to contact our office. Our fax number is [phone].  If you have an urgent issue when the clinic is closed that cannot wait until the next business day, you can page your doctor at the number below.    Please note that while we do our best to be available for urgent issues outside of office hours, we are not available 24/7.   If you have an urgent issue and are unable to reach us , you may choose to seek medical care at your doctor's office, retail clinic, urgent care center, or emergency room.  If you have a medical emergency, please immediately call 911 or go to the emergency department. In the event of inclement weather, please call our main line at [phone] for an update on the status of any delays or closures.  Dermatology Medication Tips: Please keep the boxes that topical medications come in in  order to help keep track of the instructions about where and how to use these. Pharmacies typically print the medication instructions only on the boxes and not directly on the medication tubes.   If your medication is too expensive, please contact our office at [phone] or send us  a message through MyChart.   We are unable to tell what your co-pay for medications will be in advance as this is different depending on your insurance coverage. However, we may be able to find a  substitute medication at lower cost or fill out paperwork to get insurance to cover a needed medication.   If a prior authorization is required to get your medication covered by your insurance company, please allow us  1-2 business days to complete this process.  Drug prices often vary depending on where the prescription is filled and some pharmacies may offer cheaper prices.  The website www.goodrx.com contains coupons for medications through different pharmacies. The prices here do not account for what the cost may be with help from insurance (it may be cheaper with your insurance), but the website can give you the price if you did not use any insurance.  - You can print the associated coupon and take it with your prescription to the pharmacy.  - You may also stop by our office during regular business hours and pick up a GoodRx coupon card.  - If you need your prescription sent electronically to a different pharmacy, notify our office through [facility] or by phone at [phone]

## 2024-02-11 NOTE — Progress Notes (Signed)
 Follow-Up Visit   Subjective  Brett Clark is a 68 y.o. male who presents for the following: Skin Cancer Screening and Full Body Skin Exam  The patient presents for Total-Body Skin Exam (TBSE) for skin cancer screening and mole check. The patient has spots, moles and lesions to be evaluated, some may be new or changing and the patient may have concern these could be cancer.    The following portions of the chart were reviewed this encounter and updated as appropriate: medications, allergies, medical history  Review of Systems:  No other skin or systemic complaints except as noted in HPI or Assessment and Plan.  Objective  Well appearing patient in no apparent distress; mood and affect are within normal limits.  A full examination was performed including scalp, head, eyes, ears, nose, lips, neck, chest, axillae, abdomen, back, buttocks, bilateral upper extremities, bilateral lower extremities, hands, feet, fingers, toes, fingernails, and toenails. All findings within normal limits unless otherwise noted below.   Relevant physical exam findings are noted in the Assessment and Plan.    Assessment & Plan   SKIN CANCER SCREENING PERFORMED TODAY.  ACTINIC DAMAGE - Chronic condition, secondary to cumulative UV/sun exposure - diffuse scaly erythematous macules with underlying dyspigmentation - Recommend daily broad spectrum sunscreen SPF 30+ to sun-exposed areas, reapply every 2 hours as needed.  - Staying in the shade or wearing long sleeves, sun glasses (UVA+UVB protection) and wide brim hats (4-inch brim around the entire circumference of the hat) are also recommended for sun protection.  - Call for new or changing lesions.  LENTIGINES, SEBORRHEIC KERATOSES, HEMANGIOMAS - Benign normal skin lesions - Benign-appearing - Call for any changes  MELANOCYTIC NEVI - Tan-brown and/or pink-flesh-colored symmetric macules and papules - Benign appearing on exam today - Observation -  Call clinic for new or changing moles - Recommend daily use of broad spectrum spf 30+ sunscreen to sun-exposed areas.   HISTORY OF SQUAMOUS CELL CARCINOMA OF THE SKIN - No evidence of recurrence today - No lymphadenopathy - Recommend regular full body skin exams - Recommend daily broad spectrum sunscreen SPF 30+ to sun-exposed areas, reapply every 2 hours as needed.  - Call if any new or changing lesions are noted between office visits    HISTORY OF BASAL CELL CARCINOMA OF THE SKIN - No evidence of recurrence today - Recommend regular full body skin exams - Recommend daily broad spectrum sunscreen SPF 30+ to sun-exposed areas, reapply every 2 hours as needed.  - Call if any new or changing lesions are noted between office visits   History of Dysplastic Nevi - No evidence of recurrence today - Recommend regular full body skin exams - Recommend daily broad spectrum sunscreen SPF 30+ to sun-exposed areas, reapply every 2 hours as needed.  - Call if any new or changing lesions are noted between office visits   DERMATOFIBROMA on left lower leg Exam: Firm pink/brown papulenodule with dimple sign. Treatment Plan: A dermatofibroma is a benign growth possibly related to trauma, such as an insect bite, cut from shaving, or inflamed acne-type bump.  Treatment options to remove include shave or excision with resulting scar and risk of recurrence.  Since benign-appearing and not bothersome, will observe for now.    INFLAMED SEBORRHEIC KERATOSIS Exam: Erythematous keratotic or waxy stuck-on papule or plaque.  Symptomatic, irritating, patient would like treated.  Benign-appearing.  Call clinic for new or changing lesions.   Prior to procedure, discussed risks of blister formation, small wound,  skin dyspigmentation, or rare scar following treatment. Recommend Vaseline ointment to treated areas while healing.  Destruction Procedure Note Destruction method: cryotherapy   Informed consent:  discussed and consent obtained   Lesion destroyed using liquid nitrogen: Yes   Outcome: patient tolerated procedure well with no complications   Post-procedure details: wound care instructions given   Locations: right lower back # of Lesions Treated: 1  DIGITAL MUCOUS CYST Exam: Solitary, smooth skin colored to translucent papule.  A digital mucous cyst also known as a myxoid cyst or pseudocyst is a ganglion cyst arising from the distal interphalangeal (DIP) joint of the finger or thumb (or, less commonly, toe). The cysts are believed to form from degeneration of connective tissue and are associated with osteoarthritic joints or injury. Although the exact etiology is unknown, it is likely that a small tear forms in a joint capsule or tendon sheath, allowing extravasation of synovial fluid into the adjacent tissue. When the fluid reacts with local tissue, it becomes more gelatinous and a cyst wall forms. With any treatment, there is a high rate of recurrence.   Treatment options include: - Puncture / Incision & Drainage (I&D) - Intralesional steroid injection - Intralesional Sclerosant injection (Asclera/ Polidocanol) - Intralesional steroid + sclerosant - Corticosteroid tape - Cryosurgery - Laser (CO2) - Infrared photocoagulation - Excision / Surgery  Treatment Plan: -Puncture and drainage - Cryo  ACTINIC KERATOSIS Exam: Erythematous thin papules/macules with gritty scale  Actinic keratoses are precancerous spots that appear secondary to cumulative UV radiation exposure/sun exposure over time. They are chronic with expected duration over 1 year. A portion of actinic keratoses will progress to squamous cell carcinoma of the skin. It is not possible to reliably predict which spots will progress to skin cancer and so treatment is recommended to prevent development of skin cancer.  Recommend daily broad spectrum sunscreen SPF 30+ to sun-exposed areas, reapply every 2 hours as needed.   Recommend staying in the shade or wearing long sleeves, sun glasses (UVA+UVB protection) and wide brim hats (4-inch brim around the entire circumference of the hat). Call for new or changing lesions.  Treatment Plan:  Prior to procedure, discussed risks of blister formation, small wound, skin dyspigmentation, or rare scar following cryotherapy. Recommend Vaseline ointment to treated areas while healing.  Destruction Procedure Note Destruction method: cryotherapy   Informed consent: discussed and consent obtained   Lesion destroyed using liquid nitrogen: Yes   Outcome: patient tolerated procedure well with no complications   Post-procedure details: wound care instructions given   Locations: left temple and right frontal scalp  # of Lesions Treated: 2   No follow-ups on file.  IBerwyn Lesches, Surg Tech III, am acting as scribe for [employer], DO.   Documentation: I have reviewed the above documentation for accuracy and completeness, and I agree with the above.  Delon Lenis, DO

## 2024-03-03 ENCOUNTER — Other Ambulatory Visit: Payer: Self-pay | Admitting: Cardiovascular Disease

## 2024-03-04 ENCOUNTER — Other Ambulatory Visit: Payer: Self-pay

## 2024-03-04 MED ORDER — FENOFIBRATE 160 MG PO TABS: 160.0000 mg | ORAL_TABLET | Freq: Every day | ORAL | 0 refills | Status: DC

## 2024-03-09 NOTE — Progress Notes (Unsigned)
 OFFICE NOTE:    Date:  03/10/2024  ID:  WOODARD PERRELL, DOB [birth year], MRN [id number] PCP: Patient, No Pcp Per  [facility] Providers Cardiologist:  Maude Emmer, MD        Coronary artery disease  Anterior NSTEMI s/p 2.5 x 18 mm DES to mLAD in 06/2014 (single vessel disease) Ischemic CM LV gram 06/2014: EF 40-45 TTE 03/03/23: EF 55-60, no RWMA, Gr 1 DD, NL RVSF, trivial MR, AV sclerosis Incomplete Right Bundle Branch Block  Renal CA s/p R partial nephrectomy  Chronic kidney disease  Hyperlipidemia  Hypertension        Discussed the use of AI scribe software for clinical note transcription with the patient. He declined.   HPI He is here alone.  He has been doing well without chest discomfort, shortness of breath, syncope.  He has not had claudication or lower extremity edema.  He is a Curator at [employer].  He does not smoke.     ROS-See HPI     Studies Reviewed:  EKG Interpretation Date/Time:  Wednesday March 10 2024 08:10:55 EDT Ventricular Rate:  85 PR Interval:  172 QRS Duration:  108 QT Interval:  398 QTC Calculation: 473 R Axis:   118  Text Interpretation: Normal sinus rhythm Right axis deviation Pulmonary disease pattern Incomplete right bundle branch block No significant change since last tracing Confirmed by Lelon Hamilton [id number]) on 03/10/2024 8:58:57 AM    Labs 03/03/2023: Total cholesterol 145, HDL 24, LDL 80, triglycerides 245         Physical Exam:  VS:  BP 120/80   Pulse 85   Ht 6' 1 (1.854 m)   Wt 192 lb 3.2 oz (87.2 kg)   SpO2 98%   BMI 25.36 kg/m        Wt Readings from Last 3 Encounters:  03/10/24 192 lb 3.2 oz (87.2 kg)  01/17/23 187 lb (84.8 kg)  12/26/21 190 lb 6.4 oz (86.4 kg)    Constitutional:      Appearance: Healthy appearance. Not in distress.  Neck:     Vascular: No carotid bruit. JVD normal.  Pulmonary:     Breath sounds: Normal breath sounds. No wheezing. No rales.  Cardiovascular:     Normal rate.  Regular rhythm.     Murmurs: There is no murmur.  Edema:    Peripheral edema absent.  Abdominal:     Palpations: Abdomen is soft.       Assessment and Plan:    Assessment & Plan Coronary artery disease involving native coronary artery of native heart without angina pectoris NSTEMI in 2015 tx with DES to LAD. EF was down at time of MI but returned to normal. TTE in 2024 with normal EF.  He is doing well without chest discomfort to suggest angina.  Continue aspirin  81 mg daily, Lipitor  80 mg daily.  Follow-up 1 year. Primary hypertension Blood pressure controlled.  Continue lisinopril  HCTZ 10/12.5 mg daily.  Arrange follow-up CMET. Pure hypercholesterolemia Triglycerides and LDL above goal last year.  Continue Lipitor  80 mg daily, fenofibrate  160 mg daily.  Arrange follow-up fasting CMET, lipids.  If LDL above 70, consider changing Lipitor  to Crestor versus referral to Pharm.D. clinic for PCSK9 inhibitor. Stage 3b chronic kidney disease (HCC) Obtain follow-up CMET.  He is followed by urology for history of renal carcinoma status post right partial nephrectomy.       Dispo:  Return in about 1 year (around 03/10/2025) for Routine  Follow Up, w/ Dr. Delford.  Signed, Glendia Ferrier, PA-C

## 2024-03-09 NOTE — Assessment & Plan Note (Signed)
 NSTEMI in 2015 tx with DES to LAD. EF was down at time of MI but returned to normal. TTE in 2024 with normal EF.

## 2024-03-10 ENCOUNTER — Encounter: Payer: Self-pay | Admitting: Physician Assistant

## 2024-03-10 ENCOUNTER — Ambulatory Visit: Attending: Cardiology | Admitting: Physician Assistant

## 2024-03-10 VITALS — BP 120/80 | HR 85 | Ht 73.0 in | Wt 192.2 lb

## 2024-03-10 DIAGNOSIS — E7800 Pure hypercholesterolemia, unspecified: Secondary | ICD-10-CM

## 2024-03-10 DIAGNOSIS — N1832 Chronic kidney disease, stage 3b: Secondary | ICD-10-CM | POA: Diagnosis not present

## 2024-03-10 DIAGNOSIS — I2510 Atherosclerotic heart disease of native coronary artery without angina pectoris: Secondary | ICD-10-CM

## 2024-03-10 DIAGNOSIS — I10 Essential (primary) hypertension: Secondary | ICD-10-CM

## 2024-03-10 MED ORDER — FENOFIBRATE 160 MG PO TABS: 160.0000 mg | ORAL_TABLET | Freq: Every day | ORAL | 3 refills | Status: DC

## 2024-03-10 MED ORDER — LISINOPRIL-HYDROCHLOROTHIAZIDE 10-12.5 MG PO TABS: 1.0000 | ORAL_TABLET | Freq: Every day | ORAL | 3 refills | Status: DC

## 2024-03-10 MED ORDER — ATORVASTATIN CALCIUM 80 MG PO TABS: 80.0000 mg | ORAL_TABLET | Freq: Every evening | ORAL | 3 refills | Status: DC

## 2024-03-10 NOTE — Patient Instructions (Signed)
 Medication Instructions:  Your physician recommends that you continue on your current medications as directed. Please refer to the Current Medication list given to you today.  *If you need a refill on your cardiac medications before your next appointment, please call your pharmacy*  Lab Work: COME BACK TO THE LAB, FASTING, FOR:  CMET & LIPID  If you have labs (blood work) drawn today and your tests are completely normal, you will receive your results only by: MyChart Message (if you have MyChart) OR A paper copy in the mail If you have any lab test that is abnormal or we need to change your treatment, we will call you to review the results.  Testing/Procedures: None ordered  Follow-Up: At [facility], you and your health needs are our priority.  As part of our continuing mission to provide you with exceptional heart care, our providers are all part of one team.  This team includes your primary Cardiologist (physician) and Advanced Practice Providers or APPs (Physician Assistants and Nurse Practitioners) who all work together to provide you with the care you need, when you need it.  Your next appointment:   1 year(s)  Provider:   Maude Emmer, MD    We recommend signing up for the patient portal called MyChart.  Sign up information is provided on this After Visit Summary.  MyChart is used to connect with patients for Virtual Visits (Telemedicine).  Patients are able to view lab/test results, encounter notes, upcoming appointments, etc.  Non-urgent messages can be sent to your provider as well.   To learn more about what you can do with MyChart, go to [email or web address].   Other Instructions

## 2024-03-10 NOTE — Assessment & Plan Note (Signed)
 Blood pressure controlled.  Continue lisinopril  HCTZ 10/12.5 mg daily.  Arrange follow-up CMET.

## 2024-03-10 NOTE — Assessment & Plan Note (Signed)
 Obtain follow-up CMET.  He is followed by urology for history of renal carcinoma status post right partial nephrectomy.

## 2024-03-10 NOTE — Assessment & Plan Note (Signed)
 Triglycerides and LDL above goal last year.  Continue Lipitor  80 mg daily, fenofibrate  160 mg daily.  Arrange follow-up fasting CMET, lipids.  If LDL above 70, consider changing Lipitor  to Crestor versus referral to Pharm.D. clinic for PCSK9 inhibitor.

## 2024-04-07 ENCOUNTER — Other Ambulatory Visit: Payer: Self-pay | Admitting: Cardiovascular Disease

## 2024-06-09 ENCOUNTER — Other Ambulatory Visit: Payer: Self-pay | Admitting: Cardiovascular Disease

## 2025-02-14 ENCOUNTER — Ambulatory Visit: Admitting: Dermatology
